# Patient Record
Sex: Female | Born: 1965 | Race: Black or African American | Hispanic: No | Smoking: Current every day smoker
Health system: Southern US, Community
[De-identification: ages and names within clinical notes are randomized; demographics above are authoritative.]

## PROBLEM LIST (undated history)

## (undated) DIAGNOSIS — R569 Unspecified convulsions: Secondary | ICD-10-CM

## (undated) DIAGNOSIS — E1121 Type 2 diabetes mellitus with diabetic nephropathy: Secondary | ICD-10-CM

## (undated) DIAGNOSIS — C50919 Malignant neoplasm of unspecified site of unspecified female breast: Secondary | ICD-10-CM

## (undated) DIAGNOSIS — E039 Hypothyroidism, unspecified: Secondary | ICD-10-CM

## (undated) DIAGNOSIS — E038 Other specified hypothyroidism: Secondary | ICD-10-CM

## (undated) DIAGNOSIS — N186 End stage renal disease: Secondary | ICD-10-CM

## (undated) DIAGNOSIS — Z720 Tobacco use: Secondary | ICD-10-CM

## (undated) DIAGNOSIS — E119 Type 2 diabetes mellitus without complications: Secondary | ICD-10-CM

## (undated) DIAGNOSIS — K529 Noninfective gastroenteritis and colitis, unspecified: Secondary | ICD-10-CM

## (undated) DIAGNOSIS — E559 Vitamin D deficiency, unspecified: Secondary | ICD-10-CM

## (undated) DIAGNOSIS — N049 Nephrotic syndrome with unspecified morphologic changes: Secondary | ICD-10-CM

## (undated) DIAGNOSIS — I10 Essential (primary) hypertension: Secondary | ICD-10-CM

## (undated) DIAGNOSIS — D649 Anemia, unspecified: Secondary | ICD-10-CM

## (undated) DIAGNOSIS — Z992 Dependence on renal dialysis: Secondary | ICD-10-CM

## (undated) DIAGNOSIS — E785 Hyperlipidemia, unspecified: Secondary | ICD-10-CM

## (undated) HISTORY — PX: PARTIAL HYSTERECTOMY: SHX80

## (undated) HISTORY — PX: BREAST SURGERY: SHX581

---

## 2015-12-20 ENCOUNTER — Emergency Department
Admission: EM | Admit: 2015-12-20 | Discharge: 2015-12-20 | Disposition: A | Discharge status: Home / Self Care | Attending: Emergency Medicine | Admitting: Emergency Medicine

## 2015-12-20 DIAGNOSIS — R220 Localized swelling, mass and lump, head: Secondary | ICD-10-CM | POA: Diagnosis not present

## 2015-12-20 DIAGNOSIS — R197 Diarrhea, unspecified: Secondary | ICD-10-CM | POA: Diagnosis not present

## 2015-12-20 DIAGNOSIS — Z880 Allergy status to penicillin: Secondary | ICD-10-CM | POA: Insufficient documentation

## 2015-12-20 DIAGNOSIS — F17210 Nicotine dependence, cigarettes, uncomplicated: Secondary | ICD-10-CM | POA: Diagnosis not present

## 2015-12-20 DIAGNOSIS — R2233 Localized swelling, mass and lump, upper limb, bilateral: Secondary | ICD-10-CM | POA: Diagnosis not present

## 2015-12-20 DIAGNOSIS — R2243 Localized swelling, mass and lump, lower limb, bilateral: Secondary | ICD-10-CM | POA: Diagnosis not present

## 2015-12-20 DIAGNOSIS — E11649 Type 2 diabetes mellitus with hypoglycemia without coma: Secondary | ICD-10-CM | POA: Insufficient documentation

## 2015-12-20 DIAGNOSIS — I10 Essential (primary) hypertension: Secondary | ICD-10-CM | POA: Diagnosis not present

## 2015-12-20 DIAGNOSIS — E162 Hypoglycemia, unspecified: Secondary | ICD-10-CM

## 2015-12-20 HISTORY — DX: Essential (primary) hypertension: I10

## 2015-12-20 LAB — BASIC METABOLIC PANEL
Anion gap: 5 (ref 5–15)
BUN: 17 mg/dL (ref 6–20)
CALCIUM: 7.9 mg/dL — AB (ref 8.9–10.3)
CO2: 30 mmol/L (ref 22–32)
CREATININE: 1.13 mg/dL — AB (ref 0.44–1.00)
Chloride: 103 mmol/L (ref 101–111)
GFR calc non Af Amer: 57 mL/min — ABNORMAL LOW (ref >60)
Glucose, Bld: 180 mg/dL — ABNORMAL HIGH (ref 65–99)
Potassium: 3.1 mmol/L — ABNORMAL LOW (ref 3.5–5.1)
SODIUM: 138 mmol/L (ref 135–145)

## 2015-12-20 LAB — HEPATIC FUNCTION PANEL
ALT: 44 U/L (ref 14–54)
AST: 52 U/L — AB (ref 15–41)
Albumin: 1.7 g/dL — ABNORMAL LOW (ref 3.5–5.0)
Alkaline Phosphatase: 105 U/L (ref 38–126)
Bilirubin, Direct: <0.1 mg/dL — ABNORMAL LOW (ref 0.1–0.5)
TOTAL PROTEIN: 5.2 g/dL — AB (ref 6.5–8.1)
Total Bilirubin: 0.6 mg/dL (ref 0.3–1.2)

## 2015-12-20 LAB — CBC
HCT: 32.5 % — ABNORMAL LOW (ref 35.0–47.0)
Hemoglobin: 10.6 g/dL — ABNORMAL LOW (ref 12.0–16.0)
MCH: 28.3 pg (ref 26.0–34.0)
MCHC: 32.7 g/dL (ref 32.0–36.0)
MCV: 86.7 fL (ref 80.0–100.0)
PLATELETS: 249 10*3/uL (ref 150–440)
RBC: 3.75 MIL/uL — AB (ref 3.80–5.20)
RDW: 15.3 % — AB (ref 11.5–14.5)
WBC: 10.2 10*3/uL (ref 3.6–11.0)

## 2015-12-20 LAB — GLUCOSE, CAPILLARY
GLUCOSE-CAPILLARY: 153 mg/dL — AB (ref 65–99)
GLUCOSE-CAPILLARY: 217 mg/dL — AB (ref 65–99)

## 2015-12-20 LAB — TROPONIN I: Troponin I: 0.03 ng/mL (ref <0.031)

## 2015-12-20 MED ORDER — PROTEIN POWD
25.0000 g | Freq: Two times a day (BID) | Status: DC
Start: 2015-12-20 — End: 2018-08-11

## 2015-12-20 NOTE — Discharge Instructions (Signed)
Please eat small meals throughout the day to maintain your blood glucose. Please follow up with her primary care doctor in 1-2 days for recheck/reevaluation. The labs of ulcer shown a low protein level. Please supplement with protein powder twice daily as written. Please probably primary care physician in one to 2 days for reevaluation.   Hypoglycemia Hypoglycemia occurs when the glucose in your blood is too low. Glucose is a type of sugar that is your body's main energy source. Hormones, such as insulin and glucagon, control the level of glucose in the blood. Insulin lowers blood glucose and glucagon increases blood glucose. Having too much insulin in your blood stream, or not eating enough food containing sugar, can result in hypoglycemia. Hypoglycemia can happen to people with or without diabetes. It can develop quickly and can be a medical emergency.  CAUSES   Missing or delaying meals.  Not eating enough carbohydrates at meals.  Taking too much diabetes medicine.  Not timing your oral diabetes medicine or insulin doses with meals, snacks, and exercise.  Nausea and vomiting.  Certain medicines.  Severe illnesses, such as hepatitis, kidney disorders, and certain eating disorders.  Increased activity or exercise without eating something extra or adjusting medicines.  Drinking too much alcohol.  A nerve disorder that affects body functions like your heart rate, blood pressure, and digestion (autonomic neuropathy).  A condition where the stomach muscles do not function properly (gastroparesis). Therefore, medicines and food may not absorb properly.  Rarely, a tumor of the pancreas can produce too much insulin. SYMPTOMS   Hunger.  Sweating (diaphoresis).  Change in body temperature.  Shakiness.  Headache.  Anxiety.  Lightheadedness.  Irritability.  Difficulty concentrating.  Dry mouth.  Tingling or numbness in the hands or feet.  Restless sleep or sleep  disturbances.  Altered speech and coordination.  Change in mental status.  Seizures or prolonged convulsions.  Combativeness.  Drowsiness (lethargic).  Weakness.  Increased heart rate or palpitations.  Confusion.  Pale, gray skin color.  Blurred or double vision.  Fainting. DIAGNOSIS  A physical exam and medical history will be performed. Your caregiver may make a diagnosis based on your symptoms. Blood tests and other lab tests may be performed to confirm a diagnosis. Once the diagnosis is made, your caregiver will see if your signs and symptoms go away once your blood glucose is raised.  TREATMENT  Usually, you can easily treat your hypoglycemia when you notice symptoms.  Check your blood glucose. If it is less than 70 mg/dl, take one of the following:   3-4 glucose tablets.    cup juice.    cup regular soda.   1 cup skim milk.   -1 tube of glucose gel.   5-6 hard candies.   Avoid high-fat drinks or food that may delay a rise in blood glucose levels.  Do not take more than the recommended amount of sugary foods, drinks, gel, or tablets. Doing so will cause your blood glucose to go too high.   Wait 10-15 minutes and recheck your blood glucose. If it is still less than 70 mg/dl or below your target range, repeat treatment.   Eat a snack if it is more than 1 hour until your next meal.  There may be a time when your blood glucose may go so low that you are unable to treat yourself at home when you start to notice symptoms. You may need someone to help you. You may even faint or be unable to  swallow. If you cannot treat yourself, someone will need to bring you to the hospital.  [place]  If you have diabetes, follow your diabetes management plan by:  Taking your medicines as directed.  Following your exercise plan.  Following your meal plan. Do not skip meals. Eat on time.  Testing your blood glucose regularly. Check your blood  glucose before and after exercise. If you exercise longer or different than usual, be sure to check blood glucose more frequently.  Wearing your medical alert jewelry that says you have diabetes.  Identify the cause of your hypoglycemia. Then, develop ways to prevent the recurrence of hypoglycemia.  Do not take a hot bath or shower right after an insulin shot.  Always carry treatment with you. Glucose tablets are the easiest to carry.  If you are going to drink alcohol, drink it only with meals.  Tell friends or family members ways to keep you safe during a seizure. This may include removing hard or sharp objects from the area or turning you on your side.  Maintain a healthy weight. SEEK MEDICAL CARE IF:   You are having problems keeping your blood glucose in your target range.  You are having frequent episodes of hypoglycemia.  You feel you might be having side effects from your medicines.  You are not sure why your blood glucose is dropping so low.  You notice a change in vision or a new problem with your vision. SEEK IMMEDIATE MEDICAL CARE IF:   Confusion develops.  A change in mental status occurs.  The inability to swallow develops.  Fainting occurs.   This information is not intended to replace advice given to you by your health care provider. Make sure you discuss any questions you have with your health care provider.   Document Released: 11/14/2005 Document Revised: 11/19/2013 Document Reviewed: 07/21/2015 Elsevier Interactive Patient Education [employer].

## 2015-12-20 NOTE — ED Notes (Signed)
Pt came in via EMS from home c/o weakness due to low blood sugar. Per EMS, pt was going in and out of alertness. Pt alert and oriented upon arrival. Pt reports blood sugar 43 this morning. Blood sugar 71 with EMS. EMS gave pt juice and peanut butter.CBG 153 upon arrival. Pt is edematous all over including eye lids, feet, and abdomen. Pt reports primary care doctor told her it was due to her chronic diarrhea and losing protein.

## 2015-12-20 NOTE — ED Provider Notes (Signed)
[facility] Emergency Department Provider Note  Time seen: 3:38 PM  I have reviewed the triage vital signs and the nursing notes.   HISTORY  Chief Complaint Hypoglycemia    HPI Alyssa Floyd is a 50 y.o. female with a past medical history of diabetes, hypertension, who presents to the emergency department with a low blood sugar. According to the daughter the patient was supposed. Church this morning, when the patient did not show to church the daughter became worried. Attempted to call her and texture without response. She called EMS who went to the house and found the patient very somnolent, they were able to arouse her and have her drink orange juice and peanut butter. Patient improved dramatically, upon arrival to the emergency department is at baseline with a blood glucose 153. Per EMS upon their arrival her blood glucose was 43. The patient states she takes Lantus 45 units at night as her only diabetic medication. She states she is very full from a big lunch yesterday and did not eat dinner which is likely the cause of her low blood glucose is morning. As a secondary complaint the patient also notes swelling in her face, arms and legs which has been ongoing for 2-3 months at this time. She states her doctor told her it was due to low protein as the patient has chronic diarrhea. Denies any worsening of this but is asking if we can check her protein.    Past Medical History  Diagnosis Date  . Diabetes mellitus without complication ([place])   . Hypertension     There are no active problems to display for this patient.   Past Surgical History  Procedure Laterality Date  . Partial hysterectomy      No current outpatient prescriptions on file.  Allergies Penicillins  No family history on file.  Social History Social History  Substance Use Topics  . Smoking status: Current Every Day Smoker -- 0.50 packs/day    Types: Cigarettes  . Smokeless tobacco:  Never Used  . Alcohol Use: No    Review of Systems Constitutional: Negative for fever. Cardiovascular: Negative for chest pain. Respiratory: Negative for shortness of breath. Gastrointestinal: Negative for abdominal pain Genitourinary: Negative for dysuria Neurological: Negative for headache 10-point ROS otherwise negative.  ____________________________________________   PHYSICAL EXAM:  VITAL SIGNS: ED Triage Vitals  Enc Vitals Group     BP 12/20/15 1455 169/92 mmHg     Pulse Rate 12/20/15 1455 66     Resp 12/20/15 1455 18     Temp --      Temp src --      SpO2 12/20/15 1455 98 %     Weight 12/20/15 1455 152 lb (68.947 kg)     Height 12/20/15 1455 5\' 5"  (1.651 m)     Head Cir --      Peak Flow --      Pain Score --      Pain Loc --      Pain Edu? --      Excl. in [place]? --     Constitutional: Alert and oriented. Well appearing and in no distress. Eyes: Patient does have mild edema of her eyelids and face. ENT   Head:  atraumatic.   Mouth/Throat: Mucous membranes are moist. Cardiovascular: Normal rate, regular rhythm. No murmur Respiratory: Normal respiratory effort without tachypnea nor retractions. Breath sounds are clear and equal bilaterally. No wheezes/rales/rhonchi. Gastrointestinal: Soft and nontender. No distention.   Musculoskeletal: Nontender with  normal range of motion in all extremities. Mild peripheral edema bilateral nontender. Neurologic:  Normal speech and language. No gross focal neurologic deficits Skin:  Skin is warm, dry and intact.  Psychiatric: Mood and affect are normal. Speech and behavior are normal.   ____________________________________________    EKG  EKG reviewed and interpreted by myself shows normal sinus rhythm at 60 bpm, narrow QRS, normal axis, normal intervals, nonspecific but no concerning ST changes.  ____________________________________________     INITIAL IMPRESSION / ASSESSMENT AND PLAN / ED COURSE  Pertinent  labs & imaging results that were available during my care of the patient were reviewed by me and considered in my medical decision making (see chart for details).  Patient presents to the emergency department hypoglycemia, now normal after administration of juice and peanut butter by EMS. Patient states she did not eat dinner last night which was abnormal for her, she still dosed her normal amount of Lantus. This is likely the cause for hypoglycemia. We'll check labs, close to monitor in the emergency department. We will also check LFTs and albumen level due to the patient's edema for 2-3 months.  Hypoglycemia likely due to insulin administration without eating. Labs are largely at her baseline. We'll discharge patient home with primary care follow-up. I discussed with the patient routine supplementation  ____________________________________________   FINAL CLINICAL IMPRESSION(S) / ED DIAGNOSES  Hypoglycemia   Harvest Dark, MD 12/25/15 [phone]

## 2015-12-28 DIAGNOSIS — E1021 Type 1 diabetes mellitus with diabetic nephropathy: Secondary | ICD-10-CM | POA: Insufficient documentation

## 2015-12-29 DIAGNOSIS — R809 Proteinuria, unspecified: Secondary | ICD-10-CM | POA: Insufficient documentation

## 2016-01-08 DIAGNOSIS — E440 Moderate protein-calorie malnutrition: Secondary | ICD-10-CM | POA: Insufficient documentation

## 2016-01-26 DIAGNOSIS — E559 Vitamin D deficiency, unspecified: Secondary | ICD-10-CM | POA: Insufficient documentation

## 2016-01-29 ENCOUNTER — Emergency Department (HOSPITAL_COMMUNITY)

## 2016-01-29 ENCOUNTER — Inpatient Hospital Stay (HOSPITAL_COMMUNITY)
Admission: EM | Admit: 2016-01-29 | Discharge: 2016-02-01 | DRG: 637 | Disposition: A | Discharge status: Home Health | Source: Intra-hospital | Attending: Infectious Disease | Admitting: Infectious Disease

## 2016-01-29 ENCOUNTER — Encounter (HOSPITAL_COMMUNITY): Payer: Self-pay

## 2016-01-29 DIAGNOSIS — E1069 Type 1 diabetes mellitus with other specified complication: Principal | ICD-10-CM | POA: Diagnosis present

## 2016-01-29 DIAGNOSIS — G9341 Metabolic encephalopathy: Secondary | ICD-10-CM | POA: Diagnosis present

## 2016-01-29 DIAGNOSIS — Z87441 Personal history of nephrotic syndrome: Secondary | ICD-10-CM | POA: Diagnosis not present

## 2016-01-29 DIAGNOSIS — G40909 Epilepsy, unspecified, not intractable, without status epilepticus: Secondary | ICD-10-CM | POA: Insufficient documentation

## 2016-01-29 DIAGNOSIS — Z90711 Acquired absence of uterus with remaining cervical stump: Secondary | ICD-10-CM

## 2016-01-29 DIAGNOSIS — E860 Dehydration: Secondary | ICD-10-CM | POA: Diagnosis not present

## 2016-01-29 DIAGNOSIS — Z720 Tobacco use: Secondary | ICD-10-CM | POA: Diagnosis present

## 2016-01-29 DIAGNOSIS — B370 Candidal stomatitis: Secondary | ICD-10-CM | POA: Diagnosis present

## 2016-01-29 DIAGNOSIS — G9340 Encephalopathy, unspecified: Secondary | ICD-10-CM | POA: Diagnosis not present

## 2016-01-29 DIAGNOSIS — Z833 Family history of diabetes mellitus: Secondary | ICD-10-CM | POA: Diagnosis not present

## 2016-01-29 DIAGNOSIS — I10 Essential (primary) hypertension: Secondary | ICD-10-CM | POA: Diagnosis not present

## 2016-01-29 DIAGNOSIS — I4581 Long QT syndrome: Secondary | ICD-10-CM | POA: Diagnosis not present

## 2016-01-29 DIAGNOSIS — R29810 Facial weakness: Secondary | ICD-10-CM

## 2016-01-29 DIAGNOSIS — H5509 Other forms of nystagmus: Secondary | ICD-10-CM | POA: Diagnosis not present

## 2016-01-29 DIAGNOSIS — E1101 Type 2 diabetes mellitus with hyperosmolarity with coma: Secondary | ICD-10-CM | POA: Insufficient documentation

## 2016-01-29 DIAGNOSIS — G510 Bell's palsy: Secondary | ICD-10-CM

## 2016-01-29 DIAGNOSIS — K529 Noninfective gastroenteritis and colitis, unspecified: Secondary | ICD-10-CM | POA: Diagnosis present

## 2016-01-29 DIAGNOSIS — E039 Hypothyroidism, unspecified: Secondary | ICD-10-CM | POA: Diagnosis present

## 2016-01-29 DIAGNOSIS — E1010 Type 1 diabetes mellitus with ketoacidosis without coma: Secondary | ICD-10-CM | POA: Diagnosis present

## 2016-01-29 DIAGNOSIS — E1065 Type 1 diabetes mellitus with hyperglycemia: Secondary | ICD-10-CM | POA: Diagnosis not present

## 2016-01-29 DIAGNOSIS — E1021 Type 1 diabetes mellitus with diabetic nephropathy: Secondary | ICD-10-CM | POA: Diagnosis present

## 2016-01-29 DIAGNOSIS — E063 Autoimmune thyroiditis: Secondary | ICD-10-CM | POA: Diagnosis not present

## 2016-01-29 DIAGNOSIS — E785 Hyperlipidemia, unspecified: Secondary | ICD-10-CM | POA: Diagnosis present

## 2016-01-29 DIAGNOSIS — D6489 Other specified anemias: Secondary | ICD-10-CM | POA: Diagnosis present

## 2016-01-29 DIAGNOSIS — Z794 Long term (current) use of insulin: Secondary | ICD-10-CM | POA: Diagnosis not present

## 2016-01-29 DIAGNOSIS — E876 Hypokalemia: Secondary | ICD-10-CM | POA: Diagnosis not present

## 2016-01-29 DIAGNOSIS — R9431 Abnormal electrocardiogram [ECG] [EKG]: Secondary | ICD-10-CM | POA: Diagnosis present

## 2016-01-29 DIAGNOSIS — N179 Acute kidney failure, unspecified: Secondary | ICD-10-CM | POA: Diagnosis present

## 2016-01-29 DIAGNOSIS — N049 Nephrotic syndrome with unspecified morphologic changes: Secondary | ICD-10-CM

## 2016-01-29 DIAGNOSIS — I6783 Posterior reversible encephalopathy syndrome: Secondary | ICD-10-CM

## 2016-01-29 DIAGNOSIS — R739 Hyperglycemia, unspecified: Secondary | ICD-10-CM

## 2016-01-29 DIAGNOSIS — E870 Hyperosmolality and hypernatremia: Secondary | ICD-10-CM | POA: Diagnosis not present

## 2016-01-29 DIAGNOSIS — D649 Anemia, unspecified: Secondary | ICD-10-CM | POA: Diagnosis present

## 2016-01-29 DIAGNOSIS — Z880 Allergy status to penicillin: Secondary | ICD-10-CM

## 2016-01-29 DIAGNOSIS — E038 Other specified hypothyroidism: Secondary | ICD-10-CM | POA: Diagnosis present

## 2016-01-29 DIAGNOSIS — R471 Dysarthria and anarthria: Secondary | ICD-10-CM | POA: Insufficient documentation

## 2016-01-29 DIAGNOSIS — F17210 Nicotine dependence, cigarettes, uncomplicated: Secondary | ICD-10-CM | POA: Diagnosis not present

## 2016-01-29 HISTORY — DX: Hyperlipidemia, unspecified: E78.5

## 2016-01-29 HISTORY — DX: Noninfective gastroenteritis and colitis, unspecified: K52.9

## 2016-01-29 HISTORY — DX: Type 2 diabetes mellitus with diabetic nephropathy: E11.21

## 2016-01-29 HISTORY — DX: Nephrotic syndrome with unspecified morphologic changes: N04.9

## 2016-01-29 HISTORY — DX: Hypothyroidism, unspecified: E03.9

## 2016-01-29 HISTORY — DX: Tobacco use: Z72.0

## 2016-01-29 HISTORY — DX: Vitamin D deficiency, unspecified: E55.9

## 2016-01-29 HISTORY — DX: Other specified hypothyroidism: E03.8

## 2016-01-29 HISTORY — DX: Anemia, unspecified: D64.9

## 2016-01-29 HISTORY — DX: Type 2 diabetes mellitus without complications: E11.9

## 2016-01-29 LAB — MAGNESIUM: MAGNESIUM: 2.4 mg/dL (ref 1.7–2.4)

## 2016-01-29 LAB — BASIC METABOLIC PANEL
ANION GAP: 17 — AB (ref 5–15)
ANION GAP: 16 — AB (ref 5–15)
Anion gap: 16 — ABNORMAL HIGH (ref 5–15)
BUN: 73 mg/dL — AB (ref 6–20)
BUN: 68 mg/dL — AB (ref 6–20)
BUN: 69 mg/dL — AB (ref 6–20)
CALCIUM: 8.1 mg/dL — AB (ref 8.9–10.3)
CALCIUM: 8.1 mg/dL — AB (ref 8.9–10.3)
CO2: 24 mmol/L (ref 22–32)
CO2: 24 mmol/L (ref 22–32)
CO2: 23 mmol/L (ref 22–32)
Calcium: 7.9 mg/dL — ABNORMAL LOW (ref 8.9–10.3)
Chloride: 89 mmol/L — ABNORMAL LOW (ref 101–111)
Chloride: 93 mmol/L — ABNORMAL LOW (ref 101–111)
Chloride: 96 mmol/L — ABNORMAL LOW (ref 101–111)
Creatinine, Ser: 2.67 mg/dL — ABNORMAL HIGH (ref 0.44–1.00)
Creatinine, Ser: 2.53 mg/dL — ABNORMAL HIGH (ref 0.44–1.00)
Creatinine, Ser: 2.33 mg/dL — ABNORMAL HIGH (ref 0.44–1.00)
GFR calc Af Amer: 23 mL/min — ABNORMAL LOW (ref >60)
GFR calc Af Amer: 25 mL/min — ABNORMAL LOW (ref >60)
GFR calc Af Amer: 27 mL/min — ABNORMAL LOW (ref >60)
GFR calc non Af Amer: 23 mL/min — ABNORMAL LOW (ref >60)
GFR, EST NON AFRICAN AMERICAN: 20 mL/min — AB (ref >60)
GFR, EST NON AFRICAN AMERICAN: 21 mL/min — AB (ref >60)
GLUCOSE: 972 mg/dL — AB (ref 65–99)
GLUCOSE: 740 mg/dL — AB (ref 65–99)
GLUCOSE: 293 mg/dL — AB (ref 65–99)
Potassium: 3.6 mmol/L (ref 3.5–5.1)
Potassium: 3.3 mmol/L — ABNORMAL LOW (ref 3.5–5.1)
Potassium: 3.7 mmol/L (ref 3.5–5.1)
SODIUM: 130 mmol/L — AB (ref 135–145)
SODIUM: 133 mmol/L — AB (ref 135–145)
Sodium: 135 mmol/L (ref 135–145)

## 2016-01-29 LAB — PHOSPHORUS: PHOSPHORUS: 5.7 mg/dL — AB (ref 2.5–4.6)

## 2016-01-29 LAB — I-STAT CHEM 8, ED
BUN: 71 mg/dL — AB (ref 6–20)
CALCIUM ION: 0.94 mmol/L — AB (ref 1.12–1.23)
Chloride: 83 mmol/L — ABNORMAL LOW (ref 101–111)
Creatinine, Ser: 2.4 mg/dL — ABNORMAL HIGH (ref 0.44–1.00)
HEMATOCRIT: 33 % — AB (ref 36.0–46.0)
Hemoglobin: 11.2 g/dL — ABNORMAL LOW (ref 12.0–15.0)
Potassium: 4.7 mmol/L (ref 3.5–5.1)
SODIUM: 120 mmol/L — AB (ref 135–145)
TCO2: 21 mmol/L (ref 0–100)

## 2016-01-29 LAB — COMPREHENSIVE METABOLIC PANEL
ALK PHOS: 122 U/L (ref 38–126)
ALT: 22 U/L (ref 14–54)
ANION GAP: 25 — AB (ref 5–15)
AST: 19 U/L (ref 15–41)
Albumin: 2 g/dL — ABNORMAL LOW (ref 3.5–5.0)
BILIRUBIN TOTAL: 1 mg/dL (ref 0.3–1.2)
BUN: 80 mg/dL — ABNORMAL HIGH (ref 6–20)
CALCIUM: 8.1 mg/dL — AB (ref 8.9–10.3)
CO2: 16 mmol/L — ABNORMAL LOW (ref 22–32)
Chloride: 79 mmol/L — ABNORMAL LOW (ref 101–111)
Creatinine, Ser: 3.04 mg/dL — ABNORMAL HIGH (ref 0.44–1.00)
GFR, EST AFRICAN AMERICAN: 20 mL/min — AB (ref >60)
GFR, EST NON AFRICAN AMERICAN: 17 mL/min — AB (ref >60)
GLUCOSE: 1361 mg/dL — AB (ref 65–99)
POTASSIUM: 4.8 mmol/L (ref 3.5–5.1)
Sodium: 120 mmol/L — ABNORMAL LOW (ref 135–145)
TOTAL PROTEIN: 5.2 g/dL — AB (ref 6.5–8.1)

## 2016-01-29 LAB — DIFFERENTIAL
Basophils Absolute: 0 10*3/uL (ref 0.0–0.1)
Basophils Relative: 0 %
EOS ABS: 0 10*3/uL (ref 0.0–0.7)
EOS PCT: 0 %
LYMPHS ABS: 0.9 10*3/uL (ref 0.7–4.0)
LYMPHS PCT: 9 %
MONO ABS: 0.5 10*3/uL (ref 0.1–1.0)
MONOS PCT: 5 %
NEUTROS PCT: 86 %
Neutro Abs: 9.1 10*3/uL — ABNORMAL HIGH (ref 1.7–7.7)

## 2016-01-29 LAB — I-STAT TROPONIN, ED: TROPONIN I, POC: 0.01 ng/mL (ref 0.00–0.08)

## 2016-01-29 LAB — RAPID URINE DRUG SCREEN, HOSP PERFORMED
Amphetamines: NOT DETECTED
Barbiturates: NOT DETECTED
Benzodiazepines: NOT DETECTED
COCAINE: NOT DETECTED
OPIATES: NOT DETECTED
TETRAHYDROCANNABINOL: NOT DETECTED

## 2016-01-29 LAB — PROTIME-INR
INR: 0.92 (ref 0.00–1.49)
PROTHROMBIN TIME: 12.6 s (ref 11.6–15.2)

## 2016-01-29 LAB — URINALYSIS, ROUTINE W REFLEX MICROSCOPIC
Glucose, UA: >1000 mg/dL — AB
KETONES UR: NEGATIVE mg/dL
LEUKOCYTES UA: NEGATIVE
NITRITE: NEGATIVE
PH: 5 (ref 5.0–8.0)
Protein, ur: 100 mg/dL — AB
Specific Gravity, Urine: 1.021 (ref 1.005–1.030)

## 2016-01-29 LAB — CBG MONITORING, ED
GLUCOSE-CAPILLARY: 581 mg/dL — AB (ref 65–99)
GLUCOSE-CAPILLARY: 526 mg/dL — AB (ref 65–99)
GLUCOSE-CAPILLARY: 358 mg/dL — AB (ref 65–99)
Glucose-Capillary: >600 mg/dL (ref 65–99)
Glucose-Capillary: >600 mg/dL (ref 65–99)
Glucose-Capillary: 322 mg/dL — ABNORMAL HIGH (ref 65–99)
Glucose-Capillary: 234 mg/dL — ABNORMAL HIGH (ref 65–99)
Glucose-Capillary: 152 mg/dL — ABNORMAL HIGH (ref 65–99)

## 2016-01-29 LAB — CBC
HCT: 31.8 % — ABNORMAL LOW (ref 36.0–46.0)
Hemoglobin: 10.2 g/dL — ABNORMAL LOW (ref 12.0–15.0)
MCH: 27.9 pg (ref 26.0–34.0)
MCHC: 32.1 g/dL (ref 30.0–36.0)
MCV: 86.9 fL (ref 78.0–100.0)
Platelets: 300 10*3/uL (ref 150–400)
RBC: 3.66 MIL/uL — AB (ref 3.87–5.11)
RDW: 14.7 % (ref 11.5–15.5)
WBC: 10.5 10*3/uL (ref 4.0–10.5)

## 2016-01-29 LAB — OSMOLALITY: OSMOLALITY: 324 mosm/kg — AB (ref 275–295)

## 2016-01-29 LAB — BETA-HYDROXYBUTYRIC ACID: BETA-HYDROXYBUTYRIC ACID: 0.14 mmol/L (ref 0.05–0.27)

## 2016-01-29 LAB — URINE MICROSCOPIC-ADD ON: WBC, UA: NONE SEEN WBC/hpf (ref 0–5)

## 2016-01-29 LAB — APTT: APTT: 21 s — AB (ref 24–37)

## 2016-01-29 LAB — ETHANOL: Alcohol, Ethyl (B): <5 mg/dL (ref <5)

## 2016-01-29 MED ORDER — SODIUM CHLORIDE 0.9% FLUSH
3.0000 mL | Freq: Two times a day (BID) | INTRAVENOUS | Status: DC
  Administered 2016-01-29 – 2016-01-31 (×5): 3 mL via INTRAVENOUS

## 2016-01-29 MED ORDER — ACETAMINOPHEN 650 MG RE SUPP: 650.0000 mg | Freq: Four times a day (QID) | RECTAL | Status: DC | PRN

## 2016-01-29 MED ORDER — DEXTROSE-NACL 5-0.45 % IV SOLN: INTRAVENOUS | Status: DC

## 2016-01-29 MED ORDER — SODIUM CHLORIDE 0.9 % IV SOLN: INTRAVENOUS | Status: AC

## 2016-01-29 MED ORDER — SODIUM CHLORIDE 0.9 % IV SOLN
INTRAVENOUS | Status: AC
  Administered 2016-01-29: 1000 mL via INTRAVENOUS

## 2016-01-29 MED ORDER — ENOXAPARIN SODIUM 30 MG/0.3ML ~~LOC~~ SOLN
30.0000 mg | SUBCUTANEOUS | Status: DC
  Administered 2016-01-29 – 2016-01-30 (×2): 30 mg via SUBCUTANEOUS
  Filled 2016-01-29: qty 0.3

## 2016-01-29 MED ORDER — NYSTATIN 100000 UNIT/ML MT SUSP
5.0000 mL | Freq: Four times a day (QID) | OROMUCOSAL | Status: DC
Start: 2016-01-29 — End: 2016-02-01
  Administered 2016-01-30 – 2016-02-01 (×8): 500000 [IU] via ORAL
  Filled 2016-01-29 (×13): qty 5

## 2016-01-29 MED ORDER — SODIUM CHLORIDE 0.9 % IV BOLUS (SEPSIS)
2000.0000 mL | Freq: Once | INTRAVENOUS | Status: AC
Start: 2016-01-29 — End: 2016-01-29
  Administered 2016-01-29: 2000 mL via INTRAVENOUS

## 2016-01-29 MED ORDER — SODIUM CHLORIDE 0.9 % IV SOLN
INTRAVENOUS | Status: DC
  Administered 2016-01-29: 5.4 [IU]/h via INTRAVENOUS
  Filled 2016-01-29 (×2): qty 2.5

## 2016-01-29 MED ORDER — INSULIN ASPART 100 UNIT/ML ~~LOC~~ SOLN
14.0000 [IU] | Freq: Once | SUBCUTANEOUS | Status: AC
  Administered 2016-01-29: 14 [IU] via INTRAVENOUS
  Filled 2016-01-29: qty 1

## 2016-01-29 MED ORDER — ACETAMINOPHEN 325 MG PO TABS: 650.0000 mg | ORAL_TABLET | Freq: Four times a day (QID) | ORAL | Status: DC | PRN

## 2016-01-29 MED ORDER — PROMETHAZINE HCL 25 MG/ML IJ SOLN: 12.5000 mg | Freq: Four times a day (QID) | INTRAMUSCULAR | Status: DC | PRN

## 2016-01-29 MED ORDER — KCL IN DEXTROSE-NACL 30-5-0.45 MEQ/L-%-% IV SOLN
INTRAVENOUS | Status: DC
  Administered 2016-01-29: 1000 mL via INTRAVENOUS
  Filled 2016-01-29 (×2): qty 1000

## 2016-01-29 NOTE — ED Notes (Signed)
Obtained pt's CBG, CBG monitor read too high to read. Nurse was notified.

## 2016-01-29 NOTE — Consult Note (Signed)
Requesting Physician: Dr. Venora Maples    Chief Complaint:  Slurred speech  HPI:                                                                                                                                         Alyssa Floyd is an 50 y.o. female with known uncontrolled DM. Patietn was last seen normal at 0330. At 0630 she was noted to be weak and have slurred speech. EMS called and code stroke activated. Currently patient is very drowsy and has slurred speech and findings consistent with Bell's palsy on the left.   Date last known well: Today Time last known well: Time: 03:30 tPA Given: No: out of window     Past Medical History  Diagnosis Date  . Diabetes mellitus without complication ([place])   . Hypertension     Past Surgical History  Procedure Laterality Date  . Partial hysterectomy      No family history on file. Social History:  reports that she has been smoking Cigarettes.  She has been smoking about 0.50 packs per day. She has never used smokeless tobacco. She reports that she does not drink alcohol or use illicit drugs.  Allergies:  Allergies  Allergen Reactions  . Penicillins Other (See Comments)    Pt reports hallucinations when taking penicillins. >Has patient had a PCN reaction causing immediate rash, facial/tongue/throat swelling, SOB or lightheadedness with hypotension: No Has patient had a PCN reaction causing severe rash involving mucus membranes or skin necrosis: No Has patient had a PCN reaction that required hospitalization No Has patient had a PCN reaction occurring within the last 10 years: No If all of the above answers are "NO", then may proceed with Cephalosporin use.    Medications:                                                                                                                           No current facility-administered medications for this encounter.   Current Outpatient Prescriptions  Medication Sig Dispense Refill  . insulin  glargine (LANTUS) 100 UNIT/ML injection Inject 45 Units into the skin at bedtime.    . Protein POWD 25 g by Does not apply route 2 (two) times daily. 454 g 0     ROS:  History obtained from unobtainable from patient due to mental status   Neurologic Examination:                                                                                                      There were no vitals taken for this visit.  HEENT-  Normocephalic, no lesions, without obvious abnormality.  Normal external eye and conjunctiva.  Normal TM's bilaterally.  Normal auditory canals and external ears. Normal external nose, mucus membranes and septum.  Normal pharynx. Cardiovascular- S1, S2 normal, pulses palpable throughout   Lungs- chest clear, no wheezing, rales, normal symmetric air entry Abdomen- normal findings: bowel sounds normal Extremities- no edema Lymph-no adenopathy palpable Musculoskeletal-no joint tenderness, deformity or swelling Skin-warm and dry, no hyperpigmentation, vitiligo, or suspicious lesions  Neurological Examination Mental Status: Alert, not oriented.  Speech dysarthic without evidence of aphasia.  Able to follow 3 step commands without difficulty. Cranial Nerves: II: Visual acuity is difficult to assess normal, pupils equal, round, reactive to light and accommodation III,IV, VI: ptosis not present, extra-ocular motions intact bilaterally--when blinking left eye lid lags behind V,VII: smile symmetric with left NL fold decfreased, facial light touch sensation normal bilaterally--focal tremor on the chin VIII: hearing normal bilaterally IX,X: uvula rises symmetrically XI: bilateral shoulder shrug XII: midline tongue extension Motor: Right : Upper extremity   5/5    Left:     Upper extremity   5/5  Lower extremity   5/5     Lower extremity   5/5 Tone and  bulk:normal tone throughout; no atrophy noted Sensory: Pinprick and light touch intact throughout, bilaterally Deep Tendon Reflexes: 2+ and symmetric throughout Plantars: Right: downgoing   Left: downgoing Cerebellar: normal finger-to-nose and normal heel-to-shin test Gait: not tested       Lab Results: Basic Metabolic Panel:  Recent Labs Lab 01/29/16 0904  NA 120*  K 4.7  CL 83*  GLUCOSE >700*  BUN 71*  CREATININE 2.40*    Liver Function Tests: No results for input(s): AST, ALT, ALKPHOS, BILITOT, PROT, ALBUMIN in the last 168 hours. No results for input(s): LIPASE, AMYLASE in the last 168 hours. No results for input(s): AMMONIA in the last 168 hours.  CBC:  Recent Labs Lab 01/29/16 0904  WBC 10.5  NEUTROABS 9.1*  HGB 11.2*  10.2*  HCT 33.0*  31.8*  MCV 86.9  PLT 300    Cardiac Enzymes: No results for input(s): CKTOTAL, CKMB, CKMBINDEX, TROPONINI in the last 168 hours.  Lipid Panel: No results for input(s): CHOL, TRIG, HDL, CHOLHDL, VLDL, LDLCALC in the last 168 hours.  CBG:  Recent Labs Lab 01/29/16 0914  GLUCAP >600*    Microbiology: No results found for this or any previous visit.  Coagulation Studies: No results for input(s): LABPROT, INR in the last 72 hours.  Imaging: Ct Head Wo Contrast  01/29/2016  CLINICAL DATA:  Acute onset slurred speech and left-sided facial droop EXAM: CT HEAD WITHOUT CONTRAST TECHNIQUE: Contiguous axial images were obtained from the base of the skull through the vertex without intravenous contrast. COMPARISON:  None.  FINDINGS: The ventricles are normal in size and configuration. There is no evidence of mass, hemorrhage, extra-axial fluid collection, or midline shift. Gray-white compartments appear normal. No acute infarct evident. Middle cerebral artery attenuation is symmetric bilaterally. Bony calvarium appears intact. The mastoid air cells are clear. No intraorbital lesions are apparent. IMPRESSION: Study within  normal limits. In particular, no edema, hemorrhage, or evidence of acute infarct. Electronically Signed   By: Lowella Grip III M.D.   On: 01/29/2016 09:12       Assessment and plan discussed with with attending physician and they are in agreement.    Etta Quill PA-C [facility] [phone]  01/29/2016, 9:20 AM   Assessment: 50 y.o. female presenting with Hyperglycemia and encephalopathic state. Not a tPA candidate due to out of window. Exam notable for left bell's palsy.  Concern for possible PRES versus simple focal seizure.   Stroke Risk Factors - hypertension

## 2016-01-29 NOTE — ED Notes (Addendum)
Admitting Md requested Foley cath be placed for strict I/O for determination of fluid maintence needed; Pt is currently incontinent and unable to collect urine specimen; Admitting requested urine specimen; due to patient holding in Ed foley will be placed in Ed; Md notified of low BP and critical osmolarity result

## 2016-01-29 NOTE — ED Notes (Signed)
Reported to Dr. Peggye Ley that pt. Has developed Nystagmus

## 2016-01-29 NOTE — H&P (Signed)
Date: 01/29/2016               Patient Name:  Alyssa Floyd MRN: [id number]  DOB: 07/02/1966 Age / Sex: 50 y.o., female   PCP: Alyssa Sizer, MD              Medical Service: Internal Medicine Teaching Service              Attending Physician: Dr. Sid Falcon, MD    First Contact: Claudius Sis, MS4 Pager: [phone]  Second Contact: Dr. Hulen Luster Pager: [phone]  Third Contact Dr. Tommy Medal Pager: [phone]-       After Hours (After 5p/  First Contact Pager: [phone]  weekends / holidays): Second Contact Pager: [phone]   Chief Complaint: Left facial droop, dysarthria  History of Present Illness: [name] is a 50 year old woman with PMH of DM1, HTN, HLD, and diabetic nephropathy who presents to the ED via EMS after having an episode of slurred speech and left facial droop at home. The patient normally lives at home in [place] but went to stay with her cousin for a few days starting Wednesday because she was not feeling well and seemed to have a cold or illness. Due to not feeling well she needed help taking her medications and went to her cousin's house for help. This morning ~7 AM the patient had an episode of left facial droop and slurred speech as witness by her cousin, so her cousin called EMS.  When she arrived to the ED she underwent stroke protocol. CT and MRI were negative. However blood sugar was elevated to 1300. The patient was recently discharged from [facility] on 2/10 after an episode of [name]. She takes her insulin and medications re prescribed at home but has been having trouble with hypo- and hyperglycemic episodes. Her last hga1c was 11.4% at [facility]. Per the patient's mother, the patient's cousin was taking her blood sugar in the days leading up to this hospitalization and the blood sugar was normal.  On admission the patient is oriented to person and place but not time. She is drowsy but able to follow commands and answer some yes/no questions. She endorses polyuria. She denies pain. She  continues to have facial droop and disconjugate gaze. The patient's mother reports that her prior episode of [name] was similar to this but without disconjugate gaze or facial droop.  Meds: Current Facility-Administered Medications  Medication Dose Route Frequency Provider Last Rate Last Dose  . 0.9 %  sodium chloride infusion   Intravenous Continuous Marjan Rabbani, MD 100 mL/hr at 01/29/16 1252 100 mL at 01/29/16 1252  . acetaminophen (TYLENOL) tablet 650 mg  650 mg Oral Q6H PRN Juluis Mire, MD       Or  . acetaminophen (TYLENOL) suppository 650 mg  650 mg Rectal Q6H PRN Marjan Rabbani, MD      . dextrose 5 %-0.45 % sodium chloride infusion   Intravenous Continuous Jola Schmidt, MD      . enoxaparin (LOVENOX) injection 30 mg  30 mg Subcutaneous Q24H Rachel L Rumbarger, RPH   30 mg at 01/29/16 1438  . insulin regular (NOVOLIN R,HUMULIN R) 250 Units in sodium chloride 0.9 % 250 mL (1 Units/mL) infusion   Intravenous Continuous Jola Schmidt, MD 16.2 mL/hr at 01/29/16 1332 16.2 Units/hr at 01/29/16 1332  . nystatin (MYCOSTATIN) 100000 UNIT/ML suspension 500,000 Units  5 mL Oral QID Juluis Mire, MD   500,000 Units at 01/29/16 1434  . promethazine (PHENERGAN) injection  12.5 mg  12.5 mg Intravenous Q6H PRN Marjan Rabbani, MD      . sodium chloride flush (NS) 0.9 % injection 3 mL  3 mL Intravenous Q12H Marjan Rabbani, MD   3 mL at 01/29/16 1332   Current Outpatient Prescriptions  Medication Sig Dispense Refill  . insulin glargine (LANTUS) 100 UNIT/ML injection Inject 45 Units into the skin at bedtime.    . Protein POWD 25 g by Does not apply route 2 (two) times daily. 454 g 0    Allergies: Allergies as of 01/29/2016 - Review Complete 01/29/2016  Allergen Reaction Noted  . Penicillins Other (See Comments) 12/20/2015   Past Medical History  Diagnosis Date  . Diabetes mellitus ([place])   . Hypertension   . Hyperlipidemia   . Nephrotic syndrome     diabetic nephropathy, biopsy on 01/06/16 at  [facility]  . Subclinical hypothyroidism   . Vitamin D deficiency   . 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 Tobacco use   . Normocytic anemia   . Diabetic nephropathy [facility])    Past Surgical History  Procedure Laterality Date  . Partial hysterectomy     Family History  Problem Relation Age of Onset  . Diabetes     Social History   Social History  . Marital Status: Legally Separated    Spouse Name: N/A  . Number of Children: N/A  . Years of Education: N/A   Occupational History  . Not on file.   Social History Main Topics  . Smoking status: Current Every Day Smoker -- 0.50 packs/day    Types: Cigarettes  . Smokeless tobacco: Never Used  . Alcohol Use: No  . Drug Use: No  . Sexual Activity: Not on file   Other Topics Concern  . Not on file   Social History Narrative    Review of Systems: Pertinent items are noted in HPI.  Physical Exam: Blood pressure 130/74, pulse 66, resp. rate 16, SpO2 100 %. BP 130/74 mmHg  Pulse 66  Resp 16  SpO2 100%  General Appearance:    Drowsy. Cooperative. NAD.  Head:    Normocephalic, without obvious abnormality, atraumatic  Eyes:    conjunctiva/corneas clear, EOM's intact, slow nystagmus with lateral gaze in both eyes in either direction. Disconjugate gaze  Lungs:     Clear to auscultation bilaterally, respirations unlabored  Chest Wall:    No tenderness or deformity   Heart:    Regular rate and rhythm, S1 and S2 normal, no murmur, rub   or gallop  Abdomen:     Soft. Tender to palpation over lower abdomen near bladder, pt reports she has to urinate.  no masses, no organomegaly. No guarding  Extremities:   Extremities atraumatic. Pedal edema. Non tender.   Neurologic:   Strength 2+ in extremities x4. Left facial droop. Preservation of brow movement. Oriented to self and place, but not time. Disconjugate gaze. Nystagmus.    Lab results: Basic Metabolic Panel:  Recent Labs  01/29/16 0904 01/29/16 1401  01/29/16 1402  NA 120*  120*  --  130*  K 4.8  4.7  --  3.6  CL 79*  83*  --  89*  CO2 16*  --  24  GLUCOSE 1361*  >700*  --  972*  BUN 80*  71*  --  73*  CREATININE 3.04*  2.40*  --  2.67*  CALCIUM 8.1*  --  8.1*  MG  --  2.4  --  PHOS  --  5.7*  --    Liver Function Tests:  Recent Labs  01/29/16 0904  AST 19  ALT 22  ALKPHOS 122  BILITOT 1.0  PROT 5.2*  ALBUMIN 2.0*   No results for input(s): LIPASE, AMYLASE in the last 72 hours. No results for input(s): AMMONIA in the last 72 hours. CBC:  Recent Labs  01/29/16 0904  WBC 10.5  NEUTROABS 9.1*  HGB 11.2*  10.2*  HCT 33.0*  31.8*  MCV 86.9  PLT 300   Cardiac Enzymes: No results for input(s): CKTOTAL, CKMB, CKMBINDEX, TROPONINI in the last 72 hours. BNP: No results for input(s): PROBNP in the last 72 hours. D-Dimer: No results for input(s): DDIMER in the last 72 hours. CBG:  Recent Labs  01/29/16 0914 01/29/16 1221 01/29/16 1326  GLUCAP >600* >600* >600*   Hemoglobin A1C: No results for input(s): HGBA1C in the last 72 hours. Fasting Lipid Panel: No results for input(s): CHOL, HDL, LDLCALC, TRIG, CHOLHDL, LDLDIRECT in the last 72 hours. Thyroid Function Tests: No results for input(s): TSH, T4TOTAL, FREET4, T3FREE, THYROIDAB in the last 72 hours. Anemia Panel: No results for input(s): VITAMINB12, FOLATE, FERRITIN, TIBC, IRON, RETICCTPCT in the last 72 hours. Coagulation:  Recent Labs  01/29/16 0904  LABPROT 12.6  INR 0.92   Urine Drug Screen: Drugs of Abuse  No results found for: LABOPIA, COCAINSCRNUR, LABBENZ, AMPHETMU, THCU, LABBARB  Alcohol Level:  Recent Labs  01/29/16 0904  ETH <5   Urinalysis: No results for input(s): COLORURINE, LABSPEC, PHURINE, GLUCOSEU, HGBUR, BILIRUBINUR, KETONESUR, PROTEINUR, UROBILINOGEN, NITRITE, LEUKOCYTESUR in the last 72 hours.  Invalid input(s): APPERANCEUR   Imaging results:  Ct Head Wo Contrast  01/29/2016  ADDENDUM REPORT: 01/29/2016  09:24 ADDENDUM: Critical Value/emergent results were called by telephone at the time of interpretation on 01/29/2016 at 9:20 am to Etta Quill, [place] , who verbally acknowledged these results. Electronically Signed   By: Lowella Grip III M.D.   On: 01/29/2016 09:24  01/29/2016  CLINICAL DATA:  Acute onset slurred speech and left-sided facial droop EXAM: CT HEAD WITHOUT CONTRAST TECHNIQUE: Contiguous axial images were obtained from the base of the skull through the vertex without intravenous contrast. COMPARISON:  None. FINDINGS: The ventricles are normal in size and configuration. There is no evidence of mass, hemorrhage, extra-axial fluid collection, or midline shift. Gray-white compartments appear normal. No acute infarct evident. Middle cerebral artery attenuation is symmetric bilaterally. Bony calvarium appears intact. The mastoid air cells are clear. No intraorbital lesions are apparent. IMPRESSION: Study within normal limits. In particular, no edema, hemorrhage, or evidence of acute infarct. Electronically Signed: By: Lowella Grip III M.D. On: 01/29/2016 09:12   Mr Brain Ltd W/o Cm  01/29/2016  CLINICAL DATA:  New onset of weakness and slurred speech noted at 6:30 a.m. today. Hyperglycemia. Symptoms compatible with left-sided Bell's palsy. EXAM: MRI HEAD WITHOUT CONTRAST TECHNIQUE: Multiplanar, multiecho pulse sequences of the brain and surrounding structures were obtained without intravenous contrast. COMPARISON:  Negative CT of the head without contrast from the same day. FINDINGS: Diffusion-weighted images demonstrate no evidence for acute or subacute infarction. Axial T2 and gradient echo images were also performed. There is no significant white matter disease. No acute hemorrhage or mass lesion is present. The ventricles are of normal size. Insert pass fluid The internal auditory canals are within normal limits bilaterally. Flow is present in the major intracranial arteries. Bilateral lens  replacements are noted. The globes and orbits otherwise intact. Mild mucosal  thickening is present in the anterior ethmoid air cells. The remaining paranasal sinuses are clear. There is fluid in the mastoid air cells bilaterally, left greater than right. IMPRESSION: 1. No evidence for acute infarct. 2. No focal white matter disease to suggest press. 3. Left greater than right mastoid fluid. No obstructing nasopharyngeal lesion is present. Electronically Signed   By: San Morelle M.D.   On: 01/29/2016 10:33    Other results: EKG: Movement artifact. Seemingly normal with no change compared to previous.  Assessment & Plan by Problem: Principal Problem:   [name], type 1 (HCC) Active Problems:   Acute renal failure (ARF) (HCC)   Prolonged Q-T interval on ECG   Hyperlipidemia   Hypertension   Chronic diarrhea   Nephrotic syndrome   Tobacco use   Facial paralysis   Normocytic anemia   Subclinical hypothyroidism   [name] Patient presented with dysarthria and facial droop, MRI and CT negative, blood sugar 1300s. Was hospitalized for [name] less than 1 month ago at [facility]. Compliant with medications but has had difficulty with hypo and hyperglycemic episodes. Felt a 'cold' coming on a few days before presentation. [name] most likely due to infectious etiology. - NPO - IV insulin - NS 100 ml/hr - BMP q4h, keep K>4 - UA for urine ketone - diabetes coordinator  Facial droop MRI and CT negative, however patient has never had facial droop with [name] before. Also has disconjugate gaze. No seizure history. Most likely etiology is bells palsy vs [name]-induced seizure. - NPO, no po meds (aspiration risk) - Speech eval for swallow study - EEG - Neuro consult, appreciate recs - Consider prednisone after resolution of [name] if facial droop persists  AKI Baseline Cr is 1.1, Cr was 3 at admission. Most likely prerenal etiology given fluid depletion from [name]. However, patient has a history of diabetic nephropathy  and has pedal edema at presentation. Takes torsemide at home to help with leg swelling. - IVF as above - Monitor pedal edema; hold home torsemide for now given risk/benefit of prioritizing [name] treatment - Daily weights  Oral thrush - Nystatin 4xday - HIV antibody  History of hypertension - Continue home meds: lisinopril  History of hyperlipidemia - Hold home statin while in hospital  History of chronic diarrhea - Hold home pancrealipase and imodium while acutely ill  History of subclinical hypothyroidism - anti TPO antibodies  History of tobacco use  Smokes 6 cigs daily.  - Consider nicotine patch once patient stabilized   This is a Careers information officer Note.  The care of the patient was discussed with Dr. Hulen Luster and the assessment and plan was formulated with their assistance.  Please see their note for official documentation of the patient encounter.   Signed: Claudius Sis, Med Student 01/29/2016, 3:10 PM

## 2016-01-29 NOTE — Procedures (Signed)
   Current facility-administered medications:  .  0.9 %  sodium chloride infusion, , Intravenous, Continuous, Marjan Rabbani, MD, Last Rate: 100 mL/hr at 01/29/16 1252, 100 mL at 01/29/16 1252 .  acetaminophen (TYLENOL) tablet 650 mg, 650 mg, Oral, Q6H PRN **OR** acetaminophen (TYLENOL) suppository 650 mg, 650 mg, Rectal, Q6H PRN, Marjan Rabbani, MD .  dextrose 5 % and 0.45 % NaCl with KCl 30 mEq/L infusion, , Intravenous, Continuous, Sid Falcon, MD .  enoxaparin (LOVENOX) injection 30 mg, 30 mg, Subcutaneous, Q24H, Rachel L Rumbarger, RPH, 30 mg at 01/29/16 1438 .  insulin regular (NOVOLIN R,HUMULIN R) 250 Units in sodium chloride 0.9 % 250 mL (1 Units/mL) infusion, , Intravenous, Continuous, Jola Schmidt, MD, Last Rate: 31.3 mL/hr at 01/29/16 1713, 31.3 Units/hr at 01/29/16 1713 .  nystatin (MYCOSTATIN) 100000 UNIT/ML suspension 500,000 Units, 5 mL, Oral, QID, Marjan Rabbani, MD, 500,000 Units at 01/29/16 1434 .  promethazine (PHENERGAN) injection 12.5 mg, 12.5 mg, Intravenous, Q6H PRN, Marjan Rabbani, MD .  sodium chloride flush (NS) 0.9 % injection 3 mL, 3 mL, Intravenous, Q12H, Marjan Rabbani, MD, 3 mL at 01/29/16 1332  Current outpatient prescriptions:  .  ferrous sulfate 325 (65 FE) MG tablet, Take 325 mg by mouth daily with breakfast., Disp: , Rfl:  .  insulin glargine (LANTUS) 100 UNIT/ML injection, Inject 45 Units into the skin daily., Disp: , Rfl:  .  lisinopril (PRINIVIL,ZESTRIL) 40 MG tablet, Take 80 mg by mouth daily., Disp: , Rfl:  .  magnesium oxide (MAG-OX) 400 MG tablet, Take 400 mg by mouth 2 (two) times daily., Disp: , Rfl: 1 .  Multiple Vitamin (MULTIVITAMIN WITH MINERALS) TABS tablet, Take 1 tablet by mouth daily., Disp: , Rfl:  .  potassium chloride SA (K-DUR,KLOR-CON) 20 MEQ tablet, Take 20 mEq by mouth daily., Disp: , Rfl: 0 .  torsemide (DEMADEX) 100 MG tablet, Take 50 mg by mouth 2 (two) times daily., Disp: , Rfl: 0 .  ZENPEP 5000 units CPEP, Take 10,000 Units by  mouth 3 (three) times daily with meals., Disp: , Rfl: 0 .  atorvastatin (LIPITOR) 40 MG tablet, Take 20 mg by mouth daily., Disp: , Rfl: 1 .  Protein POWD, 25 g by Does not apply route 2 (two) times daily. (Patient not taking: Reported on 01/29/2016), Disp: 454 g, Rfl: 0 .  Vitamin D, Ergocalciferol, (DRISDOL) 50000 units CAPS capsule, Take 50,000 Units by mouth once a week., Disp: , Rfl:   Introduction:  This is a 19 channel routine scalp EEG performed at the bedside with bipolar and monopolar montages arranged in accordance to the international 10/20 system of electrode placement. One channel was dedicated to EKG recording.    Findings:  The background rhythm was normal 5-6 Hz alpha . No definite evidence of abnormal epileptiform discharges or electrographic seizures were noted during this recording.   Impression:  Abnormal  routine inpatient EEG suggestive of moderate encephalopathy.  Clinical correlation is recommended .

## 2016-01-29 NOTE — H&P (Signed)
Date: 01/29/2016               Patient Name:  Alyssa Floyd MRN: [id number]  DOB: September 25, 1966 Age / Sex: 50 y.o., female   PCP: Steele Sizer, MD         Medical Service: Internal Medicine Teaching Service         Attending Physician: Dr. Sid Falcon, MD    First Contact: Dr. Benjamine Mola Pager: [phone]  Second Contact: Dr. 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  History limited due to patient's dysarthria.   Per chart review she was last seen normal at 3:30 this morning. Per mother she has been feeling weak with unsteady gait and had to use a cane to ambulate to the restroom. She was too weak to arise from the toliet. She was found to have dysarthria (6:30 AM) and facial droop on EMS arrival. Code stroke was activated on ED arrival. CT head was normal. MRI brain did not show evidence of CVA or white matter disease to suggest PRESS. She denies focal weakness, paraesthesias, dysphagia, changes in vision, headache, dizziness, inability to close eyes, ear pain, drooling, shaking, or history of similar symptoms in the past. She denies recent illness, history of CVA, history of seizures, or Bell's Palsy. She is able to rise her eyebrows.   She has Type 1 diabetes mellitus (dx 13 yrs ago) that is managed at [facility] with last visit on 01/26/16. Her A1c was 11.4 on 12/18/15. She reports compliance with taking Lantus 14 U daily (decreased from 16 U daily on 01/26/16) and Humalog 6 U TID with meals. Her blood sugars at home are  usually low. Her blood sugar was 1361 on arrival. She was recently hospitalized at [facility] from 1/30 - 2/10 for [name]. She reports polydipsia and polyuria. She reports compliance with taking torsemide 50 mg BID for chronic LE edema in setting of nephrotic syndrome from diabetic nephropathy.    PSH: Partial hysterectomy  FH: Diabetes SH: Smokes 6 cigarettes daily, no alcohol or drug use All: PCN  Meds:  No current facility-administered medications on file prior to encounter.   Current Outpatient Prescriptions on File Prior to Encounter  Medication Sig Dispense Refill  . insulin glargine (LANTUS) 100 UNIT/ML injection Inject 45 Units into the skin at bedtime.    . Protein POWD 25 g by Does not apply route 2 (two) times daily. 454 g 0     Allergies: Allergies as of 01/29/2016 - Review Complete 01/29/2016  Allergen Reaction Noted  . Penicillins Other (See Comments) 12/20/2015   Past Medical History  Diagnosis Date  . Diabetes mellitus without complication ([place])   . Hypertension   . Hyperlipidemia   . Nephrotic syndrome     diabetic nephropathy, biopst on 01/06/16 at [facility]  . Subclinical hypothyroidism   . Vitamin D deficiency   . Chronic diarrhea   . Tobacco use   . Normocytic anemia    Past Surgical History  Procedure Laterality Date  . Partial hysterectomy     Family  History  Problem Relation Age of Onset  . Diabetes     Social History   Social History  . Marital Status: Legally Separated    Spouse Name: N/A  . Number of Children: N/A  . Years of Education: N/A   Occupational History  . Not on file.   Social History Main Topics  . Smoking status: Current Every Day Smoker -- 0.50 packs/day    Types: Cigarettes  . Smokeless tobacco: Never Used  . Alcohol Use: No  . Drug Use: No  . Sexual Activity: Not on file   Other Topics Concern  . Not on file   Social History Narrative    Review of Systems: Review of Systems  Constitutional: Negative for fever, chills  and weight loss.  HENT: Negative for ear pain and sore throat.   Eyes: Negative for blurred vision and double vision.  Respiratory: Negative for cough, shortness of breath and wheezing.   Cardiovascular: Positive for leg swelling (chronic b/l LE). Negative for chest pain.  Gastrointestinal: Positive for diarrhea (chronic). Negative for nausea, vomiting, abdominal pain, constipation and blood in stool.  Genitourinary: Negative for dysuria, urgency and frequency.       Polyuria   Musculoskeletal: Negative for myalgias.  Neurological: Positive for speech change and weakness. Negative for dizziness, sensory change, focal weakness, seizures and headaches.       Facial droop, unsteady gait  Endo/Heme/Allergies: Positive for polydipsia.    Physical Exam: Blood pressure 144/73, pulse 66, resp. rate 16, SpO2 100 %.  Physical Exam  Constitutional: She appears well-developed and well-nourished. No distress.  HENT:  Head: Normocephalic and atraumatic.  Right Ear: External ear normal.  Left Ear: External ear normal.  Nose: Nose normal.  Mouth/Throat: Oropharyngeal exudate (Oral thrush) present.  Eyes: Conjunctivae and EOM are normal. Pupils are equal, round, and reactive to light. Right eye exhibits no discharge. Left eye exhibits no discharge. No scleral icterus.  Mild proptosis   Neck: Normal range of motion. Neck supple.  Cardiovascular: Normal rate, regular rhythm and normal heart sounds.   Pulmonary/Chest: Effort normal and breath sounds normal. No respiratory distress. She has no wheezes. She has no rales.  Abdominal: Soft. Bowel sounds are normal. She exhibits no distension. There is no tenderness. There is no rebound and no guarding.  Musculoskeletal: Normal range of motion. She exhibits edema (+-2 b/l LE ). She exhibits no tenderness.  Neurological: She is alert. She has normal reflexes. She displays normal reflexes. A cranial nerve deficit (Loss of left nasolabial fold with drooping of  lower lip. Preservation of forehead and brow movement.) is present. She exhibits normal muscle tone.  Orientated x 2 (self, place). Dysarthric. Normal 5/5 muscle strength throughout. Normal sensation to light touch of extremities.   Skin: Skin is warm and dry. No rash noted. She is not diaphoretic. No erythema. No pallor.  Psychiatric: She has a normal mood and affect. Her behavior is normal. Judgment and thought content normal.     Lab results: Basic Metabolic Panel:  Recent Labs  01/29/16 0904  NA 120*  120*  K 4.8  4.7  CL 79*  83*  CO2 16*  GLUCOSE 1361*  >700*  BUN 80*  71*  CREATININE 3.04*  2.40*  CALCIUM 8.1*   Liver Function Tests:  Recent Labs  01/29/16 0904  AST 19  ALT 22  ALKPHOS 122  BILITOT 1.0  PROT 5.2*  ALBUMIN 2.0*   CBC:  Recent Labs  01/29/16 0904  WBC 10.5  NEUTROABS 9.1*  HGB 11.2*  10.2*  HCT 33.0*  31.8*  MCV 86.9  PLT 300   CBG:  Recent Labs  01/29/16 0914  GLUCAP >600*   Coagulation:  Recent Labs  01/29/16 0904  LABPROT 12.6  INR 0.92   Urine Drug Screen:  Alcohol Level:  Recent Labs  01/29/16 0904  ETH <5   Urinalysis: No results for input(s): COLORURINE, LABSPEC, PHURINE, GLUCOSEU, HGBUR, BILIRUBINUR, KETONESUR, PROTEINUR, UROBILINOGEN, NITRITE, LEUKOCYTESUR in the last 72 hours.  Invalid input(s): APPERANCEUR   Imaging results:  Ct Head Wo Contrast  01/29/2016  ADDENDUM REPORT: 01/29/2016 09:24 ADDENDUM: Critical Value/emergent results were called by telephone at the time of interpretation on 01/29/2016 at 9:20 am to Etta Quill, [place] , who verbally acknowledged these results. Electronically Signed   By: Lowella Grip III M.D.   On: 01/29/2016 09:24  01/29/2016  CLINICAL DATA:  Acute onset slurred speech and left-sided facial droop EXAM: CT HEAD WITHOUT CONTRAST TECHNIQUE: Contiguous axial images were obtained from the base of the skull through the vertex without intravenous contrast. COMPARISON:  None.  FINDINGS: The ventricles are normal in size and configuration. There is no evidence of mass, hemorrhage, extra-axial fluid collection, or midline shift. Gray-white compartments appear normal. No acute infarct evident. Middle cerebral artery attenuation is symmetric bilaterally. Bony calvarium appears intact. The mastoid air cells are clear. No intraorbital lesions are apparent. IMPRESSION: Study within normal limits. In particular, no edema, hemorrhage, or evidence of acute infarct. Electronically Signed: By: Lowella Grip III M.D. On: 01/29/2016 09:12   Mr Brain Ltd W/o Cm  01/29/2016  CLINICAL DATA:  New onset of weakness and slurred speech noted at 6:30 a.m. today. Hyperglycemia. Symptoms compatible with left-sided Bell's palsy. EXAM: MRI HEAD WITHOUT CONTRAST TECHNIQUE: Multiplanar, multiecho pulse sequences of the brain and surrounding structures were obtained without intravenous contrast. COMPARISON:  Negative CT of the head without contrast from the same day. FINDINGS: Diffusion-weighted images demonstrate no evidence for acute or subacute infarction. Axial T2 and gradient echo images were also performed. There is no significant white matter disease. No acute hemorrhage or mass lesion is present. The ventricles are of normal size. Insert pass fluid The internal auditory canals are within normal limits bilaterally. Flow is present in the major intracranial arteries. Bilateral lens replacements are noted. The globes and orbits otherwise intact. Mild mucosal thickening is present in the anterior ethmoid air cells. The remaining paranasal sinuses are clear. There is fluid in the mastoid air cells bilaterally, left greater than right. IMPRESSION: 1. No evidence for acute infarct. 2. No focal white matter disease to suggest press. 3. Left greater than right mastoid fluid. No obstructing nasopharyngeal lesion is present. Electronically Signed   By: San Morelle M.D.   On: 01/29/2016 10:33    Other  results: EKG: .  Vent. rate 75 BPM PR interval 152 ms QRS duration 160 ms QT/QTc 523/584 ms P-R-T axes 41 104 40  Sinus rhythm Nonspecific intraventricular conduction delay Repol abnrm, severe global ischemia (LM/MVD) diffuse nonspecific st changes   Assessment & Plan by Problem:  [name] in setting of uncontrolled Type 1 DM -  Pt with BG of 1361 and bicarb 16 with no pH or UA most likely due to [name] which she recently hospitalized for at [facility] from 1/30-2/10. Last  A1c was 11.4 on 12/18/15. She reports compliance with taking Lantus 14 U daily (decreased from 16 U daily on 01/26/16) and Humalog 6 U TID  with meals. Her blood sugars at home are usually low. No infectious or ischemic etiology. Most likely due to dietary indiscretion vs noncompliance vs inadequate insulin dosage.  -Admit to stepdown and keep NPO -Continue IV insulin via [name] protocol -Start NS 100 mL/hr x 12 hrs  (received 2 L NS in ED) -BMP Q 4 hr until AG closes x 2 -Replete potasium as needed to keep >4  -Follow-up UA for urine ketones  -Follow-up diabetes coordinator recommendations   Facial Paralysis and Dysarthria - Pt with loss of left nasolabial fold and droop with preservation of forehead and brow movements suggesting central paralysis but MRI brain normal and lack of other neurological symptoms suggest peripheral paralysis most likely due to Bell's palsy from uncontrolled DM which is common in diabetics. Pt denies recent illness or history of HSV infection.  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 Etiology most likely pre-renal azotemia in setting of volume depletion from [name]. Pt has diabetic nephropathy with nephrotic syndrome diagnosed by kidney biopsy on 01/06/16. Renal US on 01/07/16 was normal. She had 2.2 of proteinuria on  12/30/15.  -NS 100 ml/hr x 12 hrs -Hold home torsemide 50 mg BID and lisinopril 80 mg daily  -Daily weights and strict I/O -Avoid nephrotoxins  Oral Thrush - Pt with scrapable white plaques on tongue in setting of uncontrolled DM. -Start nystatin wash QID until resolves  -Obtain HIV Ab   Hypertension - Currently normotensive  -Hold home lisinopril 80 mg daily and torsemide 50 mg BID  Hyperlipidemia - Last lipid panel on 12/28/15 with LDL 112.  -Hold home atorvastatin 40 mg daily   Subclinical Hypothyroidism - Last TSH 8.43 with normal free T4 on 01/03/16. Pt not on replacement therapy.  -Obtain anti-TPO -Pt follows with [facility] end[facility]   Chronic normocytic anemia - Hg 10.2 at baseline with no active bleeding. Last anemia panel with ferritin 182, TIBC low at 170, iron low at 14, and Tstat low at 8%. Pt is s/p hysterectomy.  -Hold home ferrous fumarate 324 mg daily  -Monitor CBC  Chronic diarrhea - Pt at home on pancreatic replacement and imodium.  -Hold home pancrelipase 2 capsules TID with meals until PO  Tobacco Use - Pt reports smoking 6 cigarettes daily.  -Pt counseled on cessation    Diet: NPO DVT PPx: Lovenox  Code: Full (need to confirm)  Dispo: Disposition is deferred at this time, awaiting improvement of current medical problems. Anticipated discharge in approximately 2- 5 day(s).   The patient does have a current PCP Steele Sizer, MD) and does need an [facility] hospital follow-up appointment after discharge.  The patient does have transportation limitations that hinder transportation to clinic appointments.  Signed: Juluis Mire, MD 01/29/2016, 11:38 AM

## 2016-01-29 NOTE — ED Notes (Signed)
Reported to Dr.Truong that pt. Has developed Nystagmus.  She asked me to call Neurology

## 2016-01-29 NOTE — ED Provider Notes (Signed)
CSN: [id number]     Arrival date & time 01/29/16  [id number] History   None    No chief complaint on file.    Level 5 caveat: Dysarthria/aphasia  HPI Patient is brought to the emergency department as a code stroke as she was noted to be dysarthric with a left-sided facial droop and generally weak.  She was found to have a critical high blood sugar was brought to the emergency department by [facility].  [place] emergency department she is dysarthric and does appear to have a small facial droop.  She was seen emergently by neurology and after head CT which showed showed no acute abnormality was found to have a left-sided Bell's palsy with associated hyperglycemia.  Patient reports she's been compliant with her diabetic medications.   Past Medical History  Diagnosis Date  . Diabetes mellitus without complication ([place])   . Hypertension    Past Surgical History  Procedure Laterality Date  . Partial hysterectomy     No family history on file. Social History  Substance Use Topics  . Smoking status: Current Every Day Smoker -- 0.50 packs/day    Types: Cigarettes  . Smokeless tobacco: Never Used  . Alcohol Use: No   OB History    No data available     Review of Systems  Unable to perform ROS: Other (Dysarthria/aphasia)      Allergies  Penicillins  Home Medications   Prior to Admission medications   Medication Sig Start Date End Date Taking? Authorizing Provider  insulin glargine (LANTUS) 100 UNIT/ML injection Inject 45 Units into the skin at bedtime.    Historical Provider, MD  Protein POWD 25 g by Does not apply route 2 (two) times daily. 12/20/15   Harvest Dark, MD   BP 145/78 mmHg  Pulse 78  Resp 16  SpO2 100% Physical Exam  ED Course  Procedures (including critical care time)  CRITICAL CARE Performed by: Hoy Morn Total critical care time: 35 minutes Critical care time was exclusive of separately billable procedures and treating other  patients. Critical care was necessary to treat or prevent imminent or life-threatening deterioration. Critical care was time spent personally by me on the following activities: development of treatment plan with patient and/or surrogate as well as nursing, discussions with consultants, evaluation of patient's response to treatment, examination of patient, obtaining history from patient or surrogate, ordering and performing treatments and interventions, ordering and review of laboratory studies, ordering and review of radiographic studies, pulse oximetry and re-evaluation of patient's condition.   Labs Review Labs Reviewed  APTT - Abnormal; Notable for the following:    aPTT 21 (*)    All other components within normal limits  CBC - Abnormal; Notable for the following:    RBC 3.66 (*)    Hemoglobin 10.2 (*)    HCT 31.8 (*)    All other components within normal limits  DIFFERENTIAL - Abnormal; Notable for the following:    Neutro Abs 9.1 (*)    All other components within normal limits  COMPREHENSIVE METABOLIC PANEL - Abnormal; Notable for the following:    Sodium 120 (*)    Chloride 79 (*)    CO2 16 (*)    Glucose, Bld 1361 (*)    BUN 80 (*)    Creatinine, Ser 3.04 (*)    Calcium 8.1 (*)    Total Protein 5.2 (*)    Albumin 2.0 (*)    GFR calc non Af Amer 17 (*)  GFR calc Af Amer 20 (*)    Anion gap 25 (*)    All other components within normal limits  BASIC METABOLIC PANEL - Abnormal; Notable for the following:    Sodium 130 (*)    Chloride 89 (*)    Glucose, Bld 972 (*)    BUN 73 (*)    Creatinine, Ser 2.67 (*)    Calcium 8.1 (*)    GFR calc non Af Amer 20 (*)    GFR calc Af Amer 23 (*)    Anion gap 17 (*)    All other components within normal limits  PHOSPHORUS - Abnormal; Notable for the following:    Phosphorus 5.7 (*)    All other components within normal limits  I-STAT CHEM 8, ED - Abnormal; Notable for the following:    Sodium 120 (*)    Chloride 83 (*)    BUN  71 (*)    Creatinine, Ser 2.40 (*)    Glucose, Bld >700 (*)    Calcium, Ion 0.94 (*)    Hemoglobin 11.2 (*)    HCT 33.0 (*)    All other components within normal limits  CBG MONITORING, ED - Abnormal; Notable for the following:    Glucose-Capillary >600 (*)    All other components within normal limits  CBG MONITORING, ED - Abnormal; Notable for the following:    Glucose-Capillary >600 (*)    All other components within normal limits  CBG MONITORING, ED - Abnormal; Notable for the following:    Glucose-Capillary >600 (*)    All other components within normal limits  CBG MONITORING, ED - Abnormal; Notable for the following:    Glucose-Capillary >600 (*)    All other components within normal limits  MRSA PCR SCREENING  ETHANOL  PROTIME-INR  MAGNESIUM  URINE RAPID DRUG SCREEN, HOSP PERFORMED  URINALYSIS, ROUTINE W REFLEX MICROSCOPIC (NOT AT [facility])  BASIC METABOLIC PANEL  BASIC METABOLIC PANEL  BASIC METABOLIC PANEL  HIV ANTIBODY (ROUTINE TESTING)  THYROID PEROXIDASE ANTIBODY  I-STAT TROPOININ, ED    Imaging Review Ct Head Wo Contrast  01/29/2016  ADDENDUM REPORT: 01/29/2016 09:24 ADDENDUM: Critical Value/emergent results were called by telephone at the time of interpretation on 01/29/2016 at 9:20 am to Etta Quill, PA , who verbally acknowledged these results. Electronically Signed   By: Lowella Grip III M.D.   On: 01/29/2016 09:24  01/29/2016  CLINICAL DATA:  Acute onset slurred speech and left-sided facial droop EXAM: CT HEAD WITHOUT CONTRAST TECHNIQUE: Contiguous axial images were obtained from the base of the skull through the vertex without intravenous contrast. COMPARISON:  None. FINDINGS: The ventricles are normal in size and configuration. There is no evidence of mass, hemorrhage, extra-axial fluid collection, or midline shift. Gray-white compartments appear normal. No acute infarct evident. Middle cerebral artery attenuation is symmetric bilaterally. Bony calvarium appears  intact. The mastoid air cells are clear. No intraorbital lesions are apparent. IMPRESSION: Study within normal limits. In particular, no edema, hemorrhage, or evidence of acute infarct. Electronically Signed: By: Lowella Grip III M.D. On: 01/29/2016 09:12   Mr Brain Ltd W/o Cm  01/29/2016  CLINICAL DATA:  New onset of weakness and slurred speech noted at 6:30 a.m. today. Hyperglycemia. Symptoms compatible with left-sided Bell's palsy. EXAM: MRI HEAD WITHOUT CONTRAST TECHNIQUE: Multiplanar, multiecho pulse sequences of the brain and surrounding structures were obtained without intravenous contrast. COMPARISON:  Negative CT of the head without contrast from the same day. FINDINGS: Diffusion-weighted images demonstrate no  evidence for acute or subacute infarction. Axial T2 and gradient echo images were also performed. There is no significant white matter disease. No acute hemorrhage or mass lesion is present. The ventricles are of normal size. Insert pass fluid The internal auditory canals are within normal limits bilaterally. Flow is present in the major intracranial arteries. Bilateral lens replacements are noted. The globes and orbits otherwise intact. Mild mucosal thickening is present in the anterior ethmoid air cells. The remaining paranasal sinuses are clear. There is fluid in the mastoid air cells bilaterally, left greater than right. IMPRESSION: 1. No evidence for acute infarct. 2. No focal white matter disease to suggest press. 3. Left greater than right mastoid fluid. No obstructing nasopharyngeal lesion is present. Electronically Signed   By: San Morelle M.D.   On: 01/29/2016 10:33   I have personally reviewed and evaluated these images and lab results as part of my medical decision-making.   EKG Interpretation   Date/Time:  Friday January 29 2016 10:33:19 EST Ventricular Rate:  75 PR Interval:  152 QRS Duration: 160 QT Interval:  523 QTC Calculation: 584 R Axis:   104 Text  Interpretation:  Sinus rhythm Nonspecific intraventricular conduction  delay Repol abnrm, severe global ischemia (LM/MVD) diffuse nonspecific st  changes Confirmed by Niklas Chretien  MD, Mariel Gaudin ([id number]) on 01/29/2016 10:39:19 AM      MDM   Final diagnoses:  Dysarthria  Hyperglycemia  Acute renal failure, unspecified acute renal failure type ([place])  Dehydration    [place] emergency department as a code stroke.  No indication for TPA at this time.  Aggressive management of her hyperglycemia with IV insulin and insulin drip was initiated.  She was given 2 L of IV normal saline first auscultation.  Her sodium is 120 but when corrected for her hyperglycemia is asked to 150.  She'll benefit from ongoing hydration.  Patient be admitted to the stepdown unit.  Acute Limited MRI demonstrates no obvious infarct.  Neurohospitalist consultation, [facility] admission    Jola Schmidt, MD 01/29/16 [phone]

## 2016-01-29 NOTE — Code Documentation (Signed)
50yo female arriving to [facility] via [facility] at [phone].  Per EMS patient woke up at 0330 at her baseline and went back to bed.  She later went to the BR at 0630 and was noted by her family to be weak and required assistance getting off the toilet.  She was noted to have slurred speech at that time.  EMS called and activated a code stroke. EMS reports that patient's CBG was too high to register.  Patient to CT on arrival.  Stroke team to the bedside.  Patient is outside the window for treatment with tPA.  CBG reading >600.  Dr. Silverio Decamp to the bedside.  Patient to go to MRI.  Bedside handoff with ED RN Cecille Rubin.

## 2016-01-29 NOTE — ED Notes (Signed)
RN attempt to call report to floor; RN to call back

## 2016-01-29 NOTE — Progress Notes (Signed)
Received page from RN stating that patient developed a new nystagmus. Informed RN that I would be down to see the patient and to also inform neurology of the new change in her neuro status. Went to bedside and discussed case with Dr. Silverio Decamp. MRI imaging of her head was reviewed together which was negative for an acute CVA. Her EEG was negative for seizure activity. Likely her nystagmus is due to metabolic encephalopathy in setting of her hyperglycemia.   - Will continue neuro checks - UA has still not been collected, per RN patient is incontinent and has been wetting her bed every hour. Asked RN to place bed pan to collect urine specimen after which she can remove the bed pan as they do not like to keep patient on the bedpan for too long. Would like to hold off on foley cath if possible.    Julious Oka, MD Internal Medicine Resident, PGY [facility] Pager: [phone]

## 2016-01-29 NOTE — ED Notes (Signed)
Unable to get an urine specimen, pt. Voided on her sheets and clothes.

## 2016-01-29 NOTE — ED Notes (Signed)
Pt. Placed in Room after MRI is completed.

## 2016-01-29 NOTE — ED Notes (Signed)
Called Charge RN [place], to report to Dr. Venora Maples,  Lab called and reported Glucose is 1361

## 2016-01-29 NOTE — ED Notes (Signed)
EEG being done at the bedside

## 2016-01-29 NOTE — ED Notes (Signed)
Admitting at bedside 

## 2016-01-29 NOTE — Progress Notes (Signed)
EEG Completed; Results Pending  

## 2016-01-29 NOTE — ED Notes (Signed)
Pt. Arrived via [facility] ambulance as a code stroke.  Pt. Arrived with garbled /slurred speech, lt. Facial droop and GCS 14.  Pt. Denies any pain or discomfort (She shakes her head no)  She is oriented to self , but not place or time.  Pt. Is maintaining airway. No distress noted.  Paramedics reports that pt.s cousin was talking with her at 330 am and she was fine.  When pt. Got up to go to the bathroom, her gait was unsteady and she was unable to raise her self off the toilet.  Her cousin then called 33.   Vitals within normal limits.    Stroke team met pt at the bridge along with the EDP

## 2016-01-29 NOTE — ED Notes (Signed)
EEG completed.

## 2016-01-29 NOTE — Progress Notes (Signed)
Inpatient Diabetes Program Recommendations  AACE/ADA: New Consensus Statement on Inpatient Glycemic Control (2015)  Target Ranges:  Prepandial:   less than 140 mg/dL      Peak postprandial:   less than 180 mg/dL (1-2 hours)      Critically ill patients:  140 - 180 mg/dL   Review of Glycemic Control Results for ABIGAEL, BROHL (MRN [id number]) as of 01/29/2016 12:05  Ref. Range 01/29/2016 09:04  Glucose Latest Ref Range: 65-99 mg/dL 1361 (HH)   Diabetes history: Type 1 diabetes Outpatient Diabetes medications: Lantus 14 units daily, Humalog 6 units tid with meals Current orders for Inpatient glycemic control:  IV insulin  Inpatient Diabetes Program Recommendations:    Note per Care Everywhere, patient see's endocrinologist at [facility].  She was just seen on February 28th and insulin doses were adjusted.  She also had an admit to [facility] on 12/28/15 for [name].  A1C in January, 2017 was 11.4%. Will follow.  Thanks, Adah Perl, RN, BC-ADM Inpatient Diabetes Coordinator Pager [phone] (8a-5p)

## 2016-01-30 DIAGNOSIS — E038 Other specified hypothyroidism: Secondary | ICD-10-CM

## 2016-01-30 DIAGNOSIS — B370 Candidal stomatitis: Secondary | ICD-10-CM

## 2016-01-30 DIAGNOSIS — R471 Dysarthria and anarthria: Secondary | ICD-10-CM

## 2016-01-30 DIAGNOSIS — E1021 Type 1 diabetes mellitus with diabetic nephropathy: Secondary | ICD-10-CM

## 2016-01-30 DIAGNOSIS — E1101 Type 2 diabetes mellitus with hyperosmolarity with coma: Secondary | ICD-10-CM | POA: Insufficient documentation

## 2016-01-30 DIAGNOSIS — G40909 Epilepsy, unspecified, not intractable, without status epilepticus: Secondary | ICD-10-CM | POA: Insufficient documentation

## 2016-01-30 DIAGNOSIS — N179 Acute kidney failure, unspecified: Secondary | ICD-10-CM

## 2016-01-30 DIAGNOSIS — G510 Bell's palsy: Secondary | ICD-10-CM

## 2016-01-30 DIAGNOSIS — I10 Essential (primary) hypertension: Secondary | ICD-10-CM

## 2016-01-30 DIAGNOSIS — D649 Anemia, unspecified: Secondary | ICD-10-CM

## 2016-01-30 DIAGNOSIS — K629 Disease of anus and rectum, unspecified: Secondary | ICD-10-CM

## 2016-01-30 LAB — GLUCOSE, CAPILLARY
GLUCOSE-CAPILLARY: 78 mg/dL (ref 65–99)
GLUCOSE-CAPILLARY: 90 mg/dL (ref 65–99)
GLUCOSE-CAPILLARY: 156 mg/dL — AB (ref 65–99)
GLUCOSE-CAPILLARY: 170 mg/dL — AB (ref 65–99)
GLUCOSE-CAPILLARY: 109 mg/dL — AB (ref 65–99)
GLUCOSE-CAPILLARY: 105 mg/dL — AB (ref 65–99)
GLUCOSE-CAPILLARY: 125 mg/dL — AB (ref 65–99)
GLUCOSE-CAPILLARY: 220 mg/dL — AB (ref 65–99)
Glucose-Capillary: 143 mg/dL — ABNORMAL HIGH (ref 65–99)
Glucose-Capillary: 244 mg/dL — ABNORMAL HIGH (ref 65–99)
Glucose-Capillary: 195 mg/dL — ABNORMAL HIGH (ref 65–99)
Glucose-Capillary: 167 mg/dL — ABNORMAL HIGH (ref 65–99)
Glucose-Capillary: 109 mg/dL — ABNORMAL HIGH (ref 65–99)
Glucose-Capillary: 219 mg/dL — ABNORMAL HIGH (ref 65–99)

## 2016-01-30 LAB — BASIC METABOLIC PANEL
ANION GAP: 8 (ref 5–15)
ANION GAP: 9 (ref 5–15)
ANION GAP: 7 (ref 5–15)
ANION GAP: 8 (ref 5–15)
ANION GAP: 11 (ref 5–15)
Anion gap: 13 (ref 5–15)
BUN: 60 mg/dL — ABNORMAL HIGH (ref 6–20)
BUN: 58 mg/dL — ABNORMAL HIGH (ref 6–20)
BUN: 56 mg/dL — AB (ref 6–20)
BUN: 51 mg/dL — ABNORMAL HIGH (ref 6–20)
BUN: 44 mg/dL — ABNORMAL HIGH (ref 6–20)
BUN: 44 mg/dL — ABNORMAL HIGH (ref 6–20)
CALCIUM: 8 mg/dL — AB (ref 8.9–10.3)
CALCIUM: 8 mg/dL — AB (ref 8.9–10.3)
CALCIUM: 8 mg/dL — AB (ref 8.9–10.3)
CHLORIDE: 100 mmol/L — AB (ref 101–111)
CHLORIDE: 100 mmol/L — AB (ref 101–111)
CO2: 30 mmol/L (ref 22–32)
CO2: 26 mmol/L (ref 22–32)
CO2: 28 mmol/L (ref 22–32)
CO2: 29 mmol/L (ref 22–32)
CO2: 28 mmol/L (ref 22–32)
CO2: 25 mmol/L (ref 22–32)
CREATININE: 1.93 mg/dL — AB (ref 0.44–1.00)
Calcium: 8.2 mg/dL — ABNORMAL LOW (ref 8.9–10.3)
Calcium: 7.7 mg/dL — ABNORMAL LOW (ref 8.9–10.3)
Calcium: 7.9 mg/dL — ABNORMAL LOW (ref 8.9–10.3)
Chloride: 102 mmol/L (ref 101–111)
Chloride: 101 mmol/L (ref 101–111)
Chloride: 103 mmol/L (ref 101–111)
Chloride: 102 mmol/L (ref 101–111)
Creatinine, Ser: 1.94 mg/dL — ABNORMAL HIGH (ref 0.44–1.00)
Creatinine, Ser: 1.89 mg/dL — ABNORMAL HIGH (ref 0.44–1.00)
Creatinine, Ser: 1.68 mg/dL — ABNORMAL HIGH (ref 0.44–1.00)
Creatinine, Ser: 1.69 mg/dL — ABNORMAL HIGH (ref 0.44–1.00)
Creatinine, Ser: 1.66 mg/dL — ABNORMAL HIGH (ref 0.44–1.00)
GFR calc Af Amer: 35 mL/min — ABNORMAL LOW (ref >60)
GFR calc Af Amer: 40 mL/min — ABNORMAL LOW (ref >60)
GFR calc Af Amer: 40 mL/min — ABNORMAL LOW (ref >60)
GFR calc Af Amer: 41 mL/min — ABNORMAL LOW (ref >60)
GFR calc non Af Amer: 29 mL/min — ABNORMAL LOW (ref >60)
GFR calc non Af Amer: 30 mL/min — ABNORMAL LOW (ref >60)
GFR calc non Af Amer: 35 mL/min — ABNORMAL LOW (ref >60)
GFR calc non Af Amer: 34 mL/min — ABNORMAL LOW (ref >60)
GFR calc non Af Amer: 35 mL/min — ABNORMAL LOW (ref >60)
GFR, EST AFRICAN AMERICAN: 34 mL/min — AB (ref >60)
GFR, EST AFRICAN AMERICAN: 34 mL/min — AB (ref >60)
GFR, EST NON AFRICAN AMERICAN: 29 mL/min — AB (ref >60)
GLUCOSE: 80 mg/dL (ref 65–99)
GLUCOSE: 160 mg/dL — AB (ref 65–99)
GLUCOSE: 120 mg/dL — AB (ref 65–99)
GLUCOSE: 216 mg/dL — AB (ref 65–99)
GLUCOSE: 190 mg/dL — AB (ref 65–99)
Glucose, Bld: 153 mg/dL — ABNORMAL HIGH (ref 65–99)
POTASSIUM: 3.4 mmol/L — AB (ref 3.5–5.1)
POTASSIUM: 3.8 mmol/L (ref 3.5–5.1)
POTASSIUM: 3.8 mmol/L (ref 3.5–5.1)
Potassium: 3.2 mmol/L — ABNORMAL LOW (ref 3.5–5.1)
Potassium: 3.3 mmol/L — ABNORMAL LOW (ref 3.5–5.1)
Potassium: 3.5 mmol/L (ref 3.5–5.1)
SODIUM: 139 mmol/L (ref 135–145)
Sodium: 140 mmol/L (ref 135–145)
Sodium: 138 mmol/L (ref 135–145)
Sodium: 139 mmol/L (ref 135–145)
Sodium: 138 mmol/L (ref 135–145)
Sodium: 136 mmol/L (ref 135–145)

## 2016-01-30 LAB — MRSA PCR SCREENING: MRSA BY PCR: NEGATIVE

## 2016-01-30 LAB — CBG MONITORING, ED
Glucose-Capillary: 163 mg/dL — ABNORMAL HIGH (ref 65–99)
Glucose-Capillary: 63 mg/dL — ABNORMAL LOW (ref 65–99)

## 2016-01-30 LAB — ACETAMINOPHEN LEVEL

## 2016-01-30 LAB — SALICYLATE LEVEL

## 2016-01-30 LAB — HIV ANTIBODY (ROUTINE TESTING W REFLEX): HIV SCREEN 4TH GENERATION: NONREACTIVE

## 2016-01-30 LAB — THYROID PEROXIDASE ANTIBODY: THYROID PEROXIDASE ANTIBODY: 68 [IU]/mL — AB (ref 0–34)

## 2016-01-30 MED ORDER — CETYLPYRIDINIUM CHLORIDE 0.05 % MT LIQD
7.0000 mL | Freq: Two times a day (BID) | OROMUCOSAL | Status: DC
  Administered 2016-01-30 – 2016-01-31 (×4): 7 mL via OROMUCOSAL

## 2016-01-30 MED ORDER — INSULIN GLARGINE 100 UNIT/ML ~~LOC~~ SOLN
10.0000 [IU] | Freq: Every day | SUBCUTANEOUS | Status: DC
  Administered 2016-01-30 – 2016-02-01 (×3): 10 [IU] via SUBCUTANEOUS
  Filled 2016-01-30 (×3): qty 0.1

## 2016-01-30 MED ORDER — INSULIN ASPART 100 UNIT/ML ~~LOC~~ SOLN
0.0000 [IU] | Freq: Three times a day (TID) | SUBCUTANEOUS | Status: DC
  Administered 2016-01-30: 3 [IU] via SUBCUTANEOUS

## 2016-01-30 MED ORDER — DEXTROSE-NACL 5-0.45 % IV SOLN
INTRAVENOUS | Status: DC
  Administered 2016-01-30: 05:00:00 via INTRAVENOUS

## 2016-01-30 MED ORDER — FLUCONAZOLE 150 MG PO TABS
150.0000 mg | ORAL_TABLET | Freq: Once | ORAL | Status: AC
  Administered 2016-01-30: 150 mg via ORAL
  Filled 2016-01-30: qty 1

## 2016-01-30 MED ORDER — CHLORHEXIDINE GLUCONATE 0.12 % MT SOLN
15.0000 mL | Freq: Two times a day (BID) | OROMUCOSAL | Status: DC
  Administered 2016-01-30 – 2016-02-01 (×6): 15 mL via OROMUCOSAL
  Filled 2016-01-30 (×6): qty 15

## 2016-01-30 MED ORDER — POTASSIUM CHLORIDE 10 MEQ/100ML IV SOLN
10.0000 meq | INTRAVENOUS | Status: AC
  Administered 2016-01-30 (×3): 10 meq via INTRAVENOUS
  Filled 2016-01-30 (×4): qty 100

## 2016-01-30 MED ORDER — DEXTROSE 50 % IV SOLN
INTRAVENOUS | Status: AC
  Administered 2016-01-30: 15 mL
  Filled 2016-01-30: qty 50

## 2016-01-30 MED ORDER — LOPERAMIDE HCL 2 MG PO CAPS
2.0000 mg | ORAL_CAPSULE | ORAL | Status: DC | PRN
  Administered 2016-01-30: 2 mg via ORAL
  Filled 2016-01-30: qty 1

## 2016-01-30 MED ORDER — POTASSIUM CHLORIDE 10 MEQ/100ML IV SOLN
10.0000 meq | INTRAVENOUS | Status: AC
  Administered 2016-01-30 (×2): 10 meq via INTRAVENOUS
  Filled 2016-01-30: qty 100

## 2016-01-30 NOTE — Progress Notes (Signed)
CBG 90 - spoke with MD about lab results - will continue to monitor CBGs and lab values.  Pt has D5+1/2NS+K fluids running.  Insulin gtt on hold.  Will continue to monitor pt.

## 2016-01-30 NOTE — Progress Notes (Signed)
Subjective: Pt AAOx3. Having some abd pain, denies n/v. She states her niece who has been around her has been sick.   Objective: Vital signs in last 24 hours: Filed Vitals:   01/30/16 0141 01/30/16 0503 01/30/16 0656 01/30/16 0800  BP: 145/71 169/78 154/77   Pulse: 71 70 75   Temp: 98 F (36.7 C) 98 F (36.7 C)  97.5 F (36.4 C)  TempSrc: Oral Oral  Axillary  Resp: 15 16 14    Height: 5\' 7"  (1.702 m)     Weight: 154 lb 15.7 oz (70.3 kg)     SpO2: 100% 100% 99%    Weight change:   Intake/Output Summary (Last 24 hours) at 01/30/16 1119 Last data filed at 01/30/16 0800  Gross per 24 hour  Intake 4123.42 ml  Output    200 ml  Net 3923.42 ml   Physical Exam  Constitutional: She is oriented to person, place, and time. She appears well-developed and well-nourished. No distress.  HENT:  Head: Normocephalic and atraumatic.  Eyes: Conjunctivae and EOM are normal. Right eye exhibits no discharge. Left eye exhibits no discharge. No scleral icterus.  Cardiovascular: Normal rate, regular rhythm and normal heart sounds.  Exam reveals no gallop and no friction rub.   No murmur heard. Pulmonary/Chest: Effort normal and breath sounds normal. No respiratory distress. She has no wheezes. She has no rales.  Abdominal: Soft. Bowel sounds are normal. She exhibits no distension and no mass. There is tenderness (mild epigastric tenderness). There is no rebound and no guarding.  Musculoskeletal: She exhibits edema (2+ pedal edema b/l).  Neurological: She is alert and oriented to person, place, and time.  Skin: Skin is warm and dry. She is not diaphoretic.   Lab Results: Basic Metabolic Panel:  Recent Labs Lab 01/29/16 1401  01/30/16 0214 01/30/16 0453  NA  --   < > 140 139  K  --   < > 3.2* 3.3*  CL  --   < > 102 100*  CO2  --   < > 30 26  GLUCOSE  --   < > 80 153*  BUN  --   < > 60* 58*  CREATININE  --   < > 1.94* 1.93*  CALCIUM  --   < > 8.0* 8.2*  MG 2.4  --   --   --   PHOS  5.7*  --   --   --   < > = values in this interval not displayed. Liver Function Tests:  Recent Labs Lab 01/29/16 0904  AST 19  ALT 22  ALKPHOS 122  BILITOT 1.0  PROT 5.2*  ALBUMIN 2.0*   No results for input(s): LIPASE, AMYLASE in the last 168 hours. No results for input(s): AMMONIA in the last 168 hours. CBC:  Recent Labs Lab 01/29/16 0904  WBC 10.5  NEUTROABS 9.1*  HGB 11.2*  10.2*  HCT 33.0*  31.8*  MCV 86.9  PLT 300   CBG:  Recent Labs Lab 01/30/16 0354 01/30/16 0459 01/30/16 0555 01/30/16 0730 01/30/16 0836 01/30/16 0948  GLUCAP 143* 156* 244* 195* 167* 170*   Coagulation:  Recent Labs Lab 01/29/16 0904  LABPROT 12.6  INR 0.92    Urinalysis:  Recent Labs Lab 01/29/16 2317  COLORURINE YELLOW  LABSPEC 1.021  PHURINE 5.0  GLUCOSEU >1000*  HGBUR SMALL*  BILIRUBINUR SMALL*  KETONESUR NEGATIVE  PROTEINUR 100*  NITRITE NEGATIVE  LEUKOCYTESUR NEGATIVE   Micro Results: Recent Results (from the past  240 hour(s))  MRSA PCR Screening     Status: None   Collection Time: 01/29/16 10:58 PM  Result Value Ref Range Status   MRSA by PCR NEGATIVE NEGATIVE Final    Comment:        The GeneXpert MRSA Assay (FDA approved for NASAL specimens only), is one component of a comprehensive MRSA colonization surveillance program. It is not intended to diagnose MRSA infection nor to guide or monitor treatment for MRSA infections.    Studies/Results: Ct Head Wo Contrast  01/29/2016  ADDENDUM REPORT: 01/29/2016 09:24 ADDENDUM: Critical Value/emergent results were called by telephone at the time of interpretation on 01/29/2016 at 9:20 am to Etta Quill, [place] , who verbally acknowledged these results. Electronically Signed   By: Lowella Grip III M.D.   On: 01/29/2016 09:24  01/29/2016  CLINICAL DATA:  Acute onset slurred speech and left-sided facial droop EXAM: CT HEAD WITHOUT CONTRAST TECHNIQUE: Contiguous axial images were obtained from the base of the  skull through the vertex without intravenous contrast. COMPARISON:  None. FINDINGS: The ventricles are normal in size and configuration. There is no evidence of mass, hemorrhage, extra-axial fluid collection, or midline shift. Gray-white compartments appear normal. No acute infarct evident. Middle cerebral artery attenuation is symmetric bilaterally. Bony calvarium appears intact. The mastoid air cells are clear. No intraorbital lesions are apparent. IMPRESSION: Study within normal limits. In particular, no edema, hemorrhage, or evidence of acute infarct. Electronically Signed: By: Lowella Grip III M.D. On: 01/29/2016 09:12   Mr Brain Ltd W/o Cm  01/29/2016  CLINICAL DATA:  New onset of weakness and slurred speech noted at 6:30 a.m. today. Hyperglycemia. Symptoms compatible with left-sided Bell's palsy. EXAM: MRI HEAD WITHOUT CONTRAST TECHNIQUE: Multiplanar, multiecho pulse sequences of the brain and surrounding structures were obtained without intravenous contrast. COMPARISON:  Negative CT of the head without contrast from the same day. FINDINGS: Diffusion-weighted images demonstrate no evidence for acute or subacute infarction. Axial T2 and gradient echo images were also performed. There is no significant white matter disease. No acute hemorrhage or mass lesion is present. The ventricles are of normal size. Insert pass fluid The internal auditory canals are within normal limits bilaterally. Flow is present in the major intracranial arteries. Bilateral lens replacements are noted. The globes and orbits otherwise intact. Mild mucosal thickening is present in the anterior ethmoid air cells. The remaining paranasal sinuses are clear. There is fluid in the mastoid air cells bilaterally, left greater than right. IMPRESSION: 1. No evidence for acute infarct. 2. No focal white matter disease to suggest press. 3. Left greater than right mastoid fluid. No obstructing nasopharyngeal lesion is present. Electronically  Signed   By: San Morelle M.D.   On: 01/29/2016 10:33   Medications: I have reviewed the patient's current medications. Scheduled Meds: . antiseptic oral rinse  7 mL Mouth Rinse q12n4p  . chlorhexidine  15 mL Mouth Rinse BID  . enoxaparin (LOVENOX) injection  30 mg Subcutaneous Q24H  . nystatin  5 mL Oral QID  . sodium chloride flush  3 mL Intravenous Q12H   Continuous Infusions: . dextrose 5 % and 0.45% NaCl 75 mL/hr at 01/30/16 0431  . insulin (NOVOLIN-R) infusion 10 Units/hr (01/30/16 0730)   PRN Meds:.acetaminophen **OR** acetaminophen, promethazine Assessment/Plan: Principal Problem:   [name], type 1 ([place]) Active Problems:   Acute renal failure (ARF) (HCC)   Prolonged Q-T interval on ECG   Hyperlipidemia   Hypertension   Chronic diarrhea   Nephrotic syndrome  Tobacco use   Facial paralysis   Normocytic anemia   Subclinical hypothyroidism  HHS-- UA negative for ketones and betahydroxybutyrate negative. Plasma osm 325. Her presenting glucose was 1361 combine with her encephalopathy and negative ketones make HHS more likely than [name]. Her encephalopathy has resolved with control of her glucose.  - continue insulin gtt - follow BMET q4, once gap closes x 2 can resume diet and start lantus 10 units ( home dose is lantus 14 U w/ lunch and novlog 6 U with meals.) - replete potassium if less than 4  Facial paralysis and dysarthria-- resolved, likely from metabolic encephalopathy in setting of severe hyperglycemia. MRI negative. EEG neg for seizure activity - neurology following, appreciate their recommendations  - PT/OT consulted  Nonoliguric AKI with nephrotic syndrome due to diabetic nephropathy - Etiology most likely pre-renal azotemia in setting of volume depletion from [name]. Pt has diabetic nephropathy with nephrotic syndrome diagnosed by kidney biopsy on 01/06/16. Renal US on 01/07/16 was normal. -NS  -Hold home torsemide 50 mg BID and lisinopril 80 mg daily  -Daily  weights and strict I/O -Avoid nephrotoxins  Oral Thrush - Pt with scrapable white plaques on tongue in setting of uncontrolled DM. - nystatin wash QID until resolves  - HIV negative  Hypertension  -Hold home lisinopril 80 mg daily and torsemide 50 mg BID  Subclinical Hypothyroidism - Last TSH 8.43 with normal free T4 on 01/03/16. Pt not on replacement therapy.  -anti-TPO elevated, will need TSH checked annually.   -Pt follows with [facility] endocrinology   Chronic normocytic anemia - Hg 10.2 at baseline with no active bleeding. Last anemia panel with ferritin 182, TIBC low at 170, iron low at 14, and Tstat low at 8%. Pt is s/p hysterectomy.  -Hold home ferrous fumarate 324 mg daily while NPO -Monitor CBC  Chronic diarrhea - Pt at home on pancreatic replacement and imodium.  -Hold home pancrelipase 2 capsules TID with meals until PO  Diet: NPO DVT PPx: Lovenox  Code: Full   Dispo: Anticipated discharge in approximately 1-2 day(s).   The patient does have a current PCP Steele Sizer, MD) and does not need an [facility] hospital follow-up appointment after discharge.  The patient does not have transportation limitations that hinder transportation to clinic appointments.  .Services Needed at time of discharge: Y = Yes, Blank = No PT:   OT:   RN:   Equipment:   Other:     LOS: 1 day   Norman Herrlich, MD 01/30/2016, 11:19 AM

## 2016-01-30 NOTE — Progress Notes (Signed)
Interval History:                                                                                                                      Alyssa Floyd is an 50 y.o. female patient who presented with severe hyperglycemia with blood sugars greater than 900. History she had altered mental status with acute encephalopathy, square wave nystagmus, subtle left lower motor neuron facial weakness, dysarthria, facial twitching and asterixis. Her blood sugars have improved significantly, less than 200 consistently.  Clinically all of her symptoms have resolved, she is at her baseline with normal mental status. Denies any new neurological symptoms   Past Medical History: Past Medical History  Diagnosis Date  . Diabetes mellitus ([place])   . Hypertension   . Hyperlipidemia   . Nephrotic syndrome     diabetic nephropathy, biopsy on 01/06/16 at [facility]  . Subclinical hypothyroidism   . Vitamin D deficiency   . 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 Tobacco use   . Normocytic anemia   . Diabetic nephropathy [facility])     Past Surgical History  Procedure Laterality Date  . Partial hysterectomy      Family History: Family History  Problem Relation Age of Onset  . Diabetes      Social History:   reports that she has been smoking Cigarettes.  She has been smoking about 0.50 packs per day. She has never used smokeless tobacco. She reports that she does not drink alcohol or use illicit drugs.  Allergies:  Allergies  Allergen Reactions  . Penicillins Other (See Comments)    Pt reports hallucinations when taking penicillins. >Has patient had a PCN reaction causing immediate rash, facial/tongue/throat swelling, SOB or lightheadedness with hypotension: No Has patient had a PCN reaction causing severe rash involving mucus membranes or skin necrosis: No Has patient had a PCN reaction that required hospitalization No Has patient had a PCN reaction occurring within the last 10 years:  No If all of the above answers are "NO", then may proceed with Cephalosporin use.     Medications:                                                                                                                         Current facility-administered medications:  .  acetaminophen (TYLENOL) tablet 650 mg, 650 mg, Oral, Q6H PRN **OR** acetaminophen (TYLENOL) suppository 650 mg, 650 mg, Rectal, Q6H PRN, Juluis Mire, MD .  antiseptic oral rinse (CPC / CETYLPYRIDINIUM CHLORIDE 0.05%) solution 7 mL, 7 mL, Mouth Rinse, q12n4p, Sid Falcon, MD, 7 mL at 01/30/16 1509 .  chlorhexidine (PERIDEX) 0.12 % solution 15 mL, 15 mL, Mouth Rinse, BID, Sid Falcon, MD, 15 mL at 01/30/16 2101 .  enoxaparin (LOVENOX) injection 30 mg, 30 mg, Subcutaneous, Q24H, Rachel L Rumbarger, RPH, 30 mg at 01/30/16 1500 .  insulin aspart (novoLOG) injection 0-9 Units, 0-9 Units, Subcutaneous, TID WC, Norman Herrlich, MD, 3 Units at 01/30/16 1730 .  insulin glargine (LANTUS) injection 10 Units, 10 Units, Subcutaneous, Daily, Norman Herrlich, MD, 10 Units at 01/30/16 1500 .  insulin regular (NOVOLIN R,HUMULIN R) 250 Units in sodium chloride 0.9 % 250 mL (1 Units/mL) infusion, , Intravenous, Continuous, Jola Schmidt, MD, Stopped at 01/30/16 1700 .  loperamide (IMODIUM) capsule 2 mg, 2 mg, Oral, PRN, Jule Ser, DO, 2 mg at 01/30/16 2102 .  nystatin (MYCOSTATIN) 100000 UNIT/ML suspension 500,000 Units, 5 mL, Oral, QID, Marjan Rabbani, MD, 500,000 Units at 01/30/16 2102 .  promethazine (PHENERGAN) injection 12.5 mg, 12.5 mg, Intravenous, Q6H PRN, Marjan Rabbani, MD .  sodium chloride flush (NS) 0.9 % injection 3 mL, 3 mL, Intravenous, Q12H, Marjan Rabbani, MD, 3 mL at 01/30/16 2102   Neurologic Examination:                                                                                                      Blood pressure 143/86, pulse 87, temperature 98.1 F (36.7 C), temperature source Oral, resp. rate 22, height 5\' 7"   (1.702 m), weight 70.3 kg (154 lb 15.7 oz), SpO2 97 %.  Evaluation of higher integrative functions including: Level of alertness: Alert,  Oriented to time, place and person Speech: fluent, no evidence of dysarthria or aphasia noted.  Test the following cranial nerves: 2-12 grossly intact. No nystagmus, no facial eakness noted Motor examination: Normal tone, bulk, full 5/5 motor strength in all 4 extremities   Lab Results: Basic Metabolic Panel:  Recent Labs Lab 01/29/16 1401  01/30/16 0453 01/30/16 0940 01/30/16 1233 01/30/16 1647 01/30/16 2010  NA  --   < > 139 138 139 138 136  K  --   < > 3.3* 3.5 3.4* 3.8 3.8  CL  --   < > 100* 101 103 102 100*  CO2  --   < > 26 28 29 28 25   GLUCOSE  --   < > 153* 160* 120* 216* 190*  BUN  --   < > 58* 56* 51* 44* 44*  CREATININE  --   < > 1.93* 1.89* 1.68* 1.69* 1.66*  CALCIUM  --   < > 8.2* 8.0* 8.0* 7.7* 7.9*  MG 2.4  --   --   --   --   --   --   PHOS 5.7*  --   --   --   --   --   --   < > = values in this interval not displayed.  Liver Function Tests:  Recent Labs Lab 01/29/16 [phone]  AST 19  ALT 22  ALKPHOS 122  BILITOT 1.0  PROT 5.2*  ALBUMIN 2.0*   No results for input(s): LIPASE, AMYLASE in the last 168 hours. No results for input(s): AMMONIA in the last 168 hours.  CBC:  Recent Labs Lab 01/29/16 0904  WBC 10.5  NEUTROABS 9.1*  HGB 11.2*  10.2*  HCT 33.0*  31.8*  MCV 86.9  PLT 300    Cardiac Enzymes: No results for input(s): CKTOTAL, CKMB, CKMBINDEX, TROPONINI in the last 168 hours.  Lipid Panel: No results for input(s): CHOL, TRIG, HDL, CHOLHDL, VLDL, LDLCALC in the last 168 hours.  CBG:  Recent Labs Lab 01/30/16 1200 01/30/16 1307 01/30/16 1416 01/30/16 1655 01/30/16 2142  GLUCAP 105* 109* 125* 219* 220*    Microbiology: Results for orders placed or performed during the hospital encounter of 01/29/16  MRSA PCR Screening     Status: None   Collection Time: 01/29/16 10:58 PM  Result Value  Ref Range Status   MRSA by PCR NEGATIVE NEGATIVE Final    Comment:        The GeneXpert MRSA Assay (FDA approved for NASAL specimens only), is one component of a comprehensive MRSA colonization surveillance program. It is not intended to diagnose MRSA infection nor to guide or monitor treatment for MRSA infections.     Imaging: Ct Head Wo Contrast  01/29/2016  ADDENDUM REPORT: 01/29/2016 09:24 ADDENDUM: Critical Value/emergent results were called by telephone at the time of interpretation on 01/29/2016 at 9:20 am to Etta Quill, [place] , who verbally acknowledged these results. Electronically Signed   By: Lowella Grip III M.D.   On: 01/29/2016 09:24  01/29/2016  CLINICAL DATA:  Acute onset slurred speech and left-sided facial droop EXAM: CT HEAD WITHOUT CONTRAST TECHNIQUE: Contiguous axial images were obtained from the base of the skull through the vertex without intravenous contrast. COMPARISON:  None. FINDINGS: The ventricles are normal in size and configuration. There is no evidence of mass, hemorrhage, extra-axial fluid collection, or midline shift. Gray-white compartments appear normal. No acute infarct evident. Middle cerebral artery attenuation is symmetric bilaterally. Bony calvarium appears intact. The mastoid air cells are clear. No intraorbital lesions are apparent. IMPRESSION: Study within normal limits. In particular, no edema, hemorrhage, or evidence of acute infarct. Electronically Signed: By: Lowella Grip III M.D. On: 01/29/2016 09:12   Mr Brain Ltd W/o Cm  01/29/2016  CLINICAL DATA:  New onset of weakness and slurred speech noted at 6:30 a.m. today. Hyperglycemia. Symptoms compatible with left-sided Bell's palsy. EXAM: MRI HEAD WITHOUT CONTRAST TECHNIQUE: Multiplanar, multiecho pulse sequences of the brain and surrounding structures were obtained without intravenous contrast. COMPARISON:  Negative CT of the head without contrast from the same day. FINDINGS: Diffusion-weighted  images demonstrate no evidence for acute or subacute infarction. Axial T2 and gradient echo images were also performed. There is no significant white matter disease. No acute hemorrhage or mass lesion is present. The ventricles are of normal size. Insert pass fluid The internal auditory canals are within normal limits bilaterally. Flow is present in the major intracranial arteries. Bilateral lens replacements are noted. The globes and orbits otherwise intact. Mild mucosal thickening is present in the anterior ethmoid air cells. The remaining paranasal sinuses are clear. There is fluid in the mastoid air cells bilaterally, left greater than right. IMPRESSION: 1. No evidence for acute infarct. 2. No focal white matter disease to suggest press. 3. Left greater than right mastoid fluid. No obstructing nasopharyngeal lesion is present.  Electronically Signed   By: San Morelle M.D.   On: 01/29/2016 10:33    Assessment and plan:   Auzhane Slutsky is an 50 y.o. female patient who presented with severe hyperglycemia associated encephalopathy with multitude of neurological symptoms as described above. After her blood sugars up improved to less than 200 consistently this morning, all of her acute neurological symptoms have resolved, and she is at her baseline mental status, normal cranial nerve exam, normal motor strength. No further recommendations from neurology standpoint. Will sign off.

## 2016-01-30 NOTE — Progress Notes (Signed)
Attempted to call and get report.  No answer 2x.  Will return call again.

## 2016-01-30 NOTE — Evaluation (Signed)
Clinical/Bedside Swallow Evaluation Patient Details  Name: Alyssa Floyd MRN: [id number] Date of Birth: 1966/11/16  Today's Date: 01/30/2016 Time: SLP Start Time (ACUTE ONLY): 1155 SLP Stop Time (ACUTE ONLY): 1213 SLP Time Calculation (min) (ACUTE ONLY): 18 min  Past Medical History:  Past Medical History  Diagnosis Date  . Diabetes mellitus ([place])   . Hypertension   . Hyperlipidemia   . Nephrotic syndrome     diabetic nephropathy, biopsy on 01/06/16 at [facility]  . Subclinical hypothyroidism   . Vitamin D deficiency   . 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 Tobacco use   . Normocytic anemia   . Diabetic nephropathy [facility])    Past Surgical History:  Past Surgical History  Procedure Laterality Date  . Partial hysterectomy     HPI:  Ms. Alyssa Floyd is a 50 year old woman with PMH of DM1, HTN, HLD, and diabetic nephropathy who presents to the ED via EMS after having an episode of slurred speech and left facial droop at home. CT and MRI negative. Diagnosed with [name], Bells palsy vs [name] induced seizure to cause facial droop, oral thrush.    Assessment / Plan / Recommendation Clinical Impression  Patient presents with a functional oropharyngeal swallow without evidence of dysphagia or aspiration. No SLP f/u indicated at this time.     Aspiration Risk  No limitations    Diet Recommendation Regular;Thin liquid   Liquid Administration via: Cup;Straw Medication Administration: Whole meds with liquid Supervision: Patient able to self feed Compensations: Slow rate;Small sips/bites Postural Changes: Seated upright at 90 degrees    Other  Recommendations Oral Care Recommendations: Oral care BID   Follow up Recommendations  None               Swallow Study   General HPI: Ms. Alyssa Floyd is a 50 year old woman with PMH of DM1, HTN, HLD, and diabetic nephropathy who presents to the ED via EMS after having an episode of slurred speech and left facial droop at home. CT  and MRI negative. Diagnosed with [name], Bells palsy vs [name] induced seizure to cause facial droop, oral thrush.  Type of Study: Bedside Swallow Evaluation Previous Swallow Assessment: none noted Diet Prior to this Study: NPO Temperature Spikes Noted: No Respiratory Status: Room air History of Recent Intubation: No Behavior/Cognition: Alert;Cooperative;Pleasant mood Oral Cavity Assessment: Other (comment) (oral thrush) Oral Cavity - Dentition: Dentures, top;Dentures, bottom (partials) Vision: Functional for self-feeding Self-Feeding Abilities: Able to feed self Patient Positioning: Upright in bed Baseline Vocal Quality: Normal Volitional Cough: Strong Volitional Swallow: Able to elicit    Oral/Motor/Sensory Function Overall Oral Motor/Sensory Function: Within functional limits   Ice Chips Ice chips: Not tested   Thin Liquid Thin Liquid: Within functional limits Presentation: Cup;Self Fed;Straw    Nectar Thick Nectar Thick Liquid: Not tested   Honey Thick Honey Thick Liquid: Not tested   Puree Puree: Within functional limits Presentation: Spoon;Self Fed   Solid   GO  Alyssa Schliep MA, CCC-SLP [phone]  Solid: Within functional limits Presentation: [place] 01/30/2016,12:14 PM

## 2016-01-30 NOTE — ED Notes (Signed)
RN attempted to call report to floor; RN to call back  

## 2016-01-30 NOTE — Progress Notes (Signed)
Subjective: Alyssa Floyd mental status has dramatically improved. She denies pain. Still feels weak. During my interview she is tearful due to frustrations with controlling her diabetes.  On further questioning, the patient reports she did not feel a viral illness coming on before going to her cousins house. Rather, her cousin was concerned for the patient because she had recently been discharged from [facility] and was already going back to work, so requested that the patient live with her for a few days to keep an eye on her. The patient otherwise felt well. She regularly checks her blood sugar 3xday and had not noticed aberrant blood sugars in the days leading up to this admission.   Objective: Vital signs in last 24 hours: Filed Vitals:   01/30/16 0141 01/30/16 0503 01/30/16 0656 01/30/16 0800  BP: 145/71 169/78 154/77   Pulse: 71 70 75   Temp: 98 F (36.7 C) 98 F (36.7 C)  97.5 F (36.4 C)  TempSrc: Oral Oral  Axillary  Resp: 15 16 14    Height: 5\' 7"  (1.702 m)     Weight: 70.3 kg (154 lb 15.7 oz)     SpO2: 100% 100% 99%    Weight change:   Intake/Output Summary (Last 24 hours) at 01/30/16 0944 Last data filed at 01/30/16 0800  Gross per 24 hour  Intake 4123.42 ml  Output    200 ml  Net 3923.42 ml    Physical Exam General Appearance:    Alert, cooperative, no distress, appears stated age  Head:    Normocephalic, without obvious abnormality, atraumatic  Eyes:    conjunctiva/corneas clear, EOM's intact, PERRLA  Nose:   Nares normal,no rhinorrhe  Throat:   Lips and mucosa dry. Abundant oral thrush.  Back:     Symmetric, no curvature, ROM normal, no CVA tenderness  Lungs:     Clear to auscultation bilaterally, respirations unlabored  Chest Wall:    No tenderness or deformity   Heart:    Regular rate and rhythm, S1 and S2 normal, no murmur, rub   or gallop  Abdomen:     Soft, no masses, no organomegaly. Mild diffuse tenderness in all four quadrants.    Extremities:   3+ edema in the legs and hands. Extremities without cyanosis, rashes, or trauma.  Skin:  Warm and dry.  Neurologic:   Alert and oriented x3. Patient speaks slowly and occasionally spends >30 seconds thinking about the answer to a question, suggesting mild sluggishness. Otherwise normal. CNII-XII intact, normal strength, sensation throughout   Lab Results: Basic Metabolic Panel:  Recent Labs  01/29/16 1401  01/30/16 0214 01/30/16 0453  NA  --   < > 140 139  K  --   < > 3.2* 3.3*  CL  --   < > 102 100*  CO2  --   < > 30 26  GLUCOSE  --   < > 80 153*  BUN  --   < > 60* 58*  CREATININE  --   < > 1.94* 1.93*  CALCIUM  --   < > 8.0* 8.2*  MG 2.4  --   --   --   PHOS 5.7*  --   --   --   < > = values in this interval not displayed. Liver Function Tests:  Recent Labs  01/29/16 0904  AST 19  ALT 22  ALKPHOS 122  BILITOT 1.0  PROT 5.2*  ALBUMIN 2.0*   No results for input(s): LIPASE, AMYLASE  in the last 72 hours. No results for input(s): AMMONIA in the last 72 hours. CBC:  Recent Labs  01/29/16 0904  WBC 10.5  NEUTROABS 9.1*  HGB 11.2*  10.2*  HCT 33.0*  31.8*  MCV 86.9  PLT 300   Cardiac Enzymes: No results for input(s): CKTOTAL, CKMB, CKMBINDEX, TROPONINI in the last 72 hours. BNP: No results for input(s): PROBNP in the last 72 hours. D-Dimer: No results for input(s): DDIMER in the last 72 hours. CBG:  Recent Labs  01/30/16 0042 01/30/16 0146 01/30/16 0253 01/30/16 0354 01/30/16 0459 01/30/16 0555  GLUCAP 63* 78 90 143* 156* 244*   Hemoglobin A1C: No results for input(s): HGBA1C in the last 72 hours. Fasting Lipid Panel: No results for input(s): CHOL, HDL, LDLCALC, TRIG, CHOLHDL, LDLDIRECT in the last 72 hours. Thyroid Function Tests: No results for input(s): TSH, T4TOTAL, FREET4, T3FREE, THYROIDAB in the last 72 hours. Anemia Panel: No results for input(s): VITAMINB12, FOLATE, FERRITIN, TIBC, IRON, RETICCTPCT in the last 72  hours. Coagulation:  Recent Labs  01/29/16 0904  LABPROT 12.6  INR 0.92   Urine Drug Screen: Drugs of Abuse     Component Value Date/Time   LABOPIA NONE DETECTED 01/29/2016 2316   COCAINSCRNUR NONE DETECTED 01/29/2016 2316   LABBENZ NONE DETECTED 01/29/2016 2316   AMPHETMU NONE DETECTED 01/29/2016 2316   THCU NONE DETECTED 01/29/2016 2316   LABBARB NONE DETECTED 01/29/2016 2316    Alcohol Level:  Recent Labs  01/29/16 0904  ETH <5   Urinalysis:  Recent Labs  01/29/16 2317  COLORURINE YELLOW  LABSPEC 1.021  PHURINE 5.0  GLUCOSEU >1000*  HGBUR SMALL*  BILIRUBINUR SMALL*  KETONESUR NEGATIVE  PROTEINUR 100*  NITRITE NEGATIVE  LEUKOCYTESUR NEGATIVE    Micro Results: Recent Results (from the past 240 hour(s))  MRSA PCR Screening     Status: None   Collection Time: 01/29/16 10:58 PM  Result Value Ref Range Status   MRSA by PCR NEGATIVE NEGATIVE Final    Comment:        The GeneXpert MRSA Assay (FDA approved for NASAL specimens only), is one component of a comprehensive MRSA colonization surveillance program. It is not intended to diagnose MRSA infection nor to guide or monitor treatment for MRSA infections.    Studies/Results: Ct Head Wo Contrast  01/29/2016  ADDENDUM REPORT: 01/29/2016 09:24 ADDENDUM: Critical Value/emergent results were called by telephone at the time of interpretation on 01/29/2016 at 9:20 am to Etta Quill, [place] , who verbally acknowledged these results. Electronically Signed   By: Lowella Grip III M.D.   On: 01/29/2016 09:24  01/29/2016  CLINICAL DATA:  Acute onset slurred speech and left-sided facial droop EXAM: CT HEAD WITHOUT CONTRAST TECHNIQUE: Contiguous axial images were obtained from the base of the skull through the vertex without intravenous contrast. COMPARISON:  None. FINDINGS: The ventricles are normal in size and configuration. There is no evidence of mass, hemorrhage, extra-axial fluid collection, or midline shift.  Gray-white compartments appear normal. No acute infarct evident. Middle cerebral artery attenuation is symmetric bilaterally. Bony calvarium appears intact. The mastoid air cells are clear. No intraorbital lesions are apparent. IMPRESSION: Study within normal limits. In particular, no edema, hemorrhage, or evidence of acute infarct. Electronically Signed: By: Lowella Grip III M.D. On: 01/29/2016 09:12   Mr Brain Ltd W/o Cm  01/29/2016  CLINICAL DATA:  New onset of weakness and slurred speech noted at 6:30 a.m. today. Hyperglycemia. Symptoms compatible with left-sided Bell's palsy. EXAM: MRI  HEAD WITHOUT CONTRAST TECHNIQUE: Multiplanar, multiecho pulse sequences of the brain and surrounding structures were obtained without intravenous contrast. COMPARISON:  Negative CT of the head without contrast from the same day. FINDINGS: Diffusion-weighted images demonstrate no evidence for acute or subacute infarction. Axial T2 and gradient echo images were also performed. There is no significant white matter disease. No acute hemorrhage or mass lesion is present. The ventricles are of normal size. Insert pass fluid The internal auditory canals are within normal limits bilaterally. Flow is present in the major intracranial arteries. Bilateral lens replacements are noted. The globes and orbits otherwise intact. Mild mucosal thickening is present in the anterior ethmoid air cells. The remaining paranasal sinuses are clear. There is fluid in the mastoid air cells bilaterally, left greater than right. IMPRESSION: 1. No evidence for acute infarct. 2. No focal white matter disease to suggest press. 3. Left greater than right mastoid fluid. No obstructing nasopharyngeal lesion is present. Electronically Signed   By: San Morelle M.D.   On: 01/29/2016 10:33   Medications:  . sodium chloride   Intravenous STAT  . antiseptic oral rinse  7 mL Mouth Rinse q12n4p  . chlorhexidine  15 mL Mouth Rinse BID  . enoxaparin  (LOVENOX) injection  30 mg Subcutaneous Q24H  . nystatin  5 mL Oral QID  . sodium chloride flush  3 mL Intravenous Q12H   acetaminophen **OR** acetaminophen, promethazine  Assessment/Plan: Principal Problem:   [name], type 1 (HCC) Active Problems:   Acute renal failure (ARF) (HCC)   Prolonged Q-T interval on ECG   Hyperlipidemia   Hypertension   Chronic diarrhea   Nephrotic syndrome   Tobacco use   Facial paralysis   Normocytic anemia   Subclinical hypothyroidism  [name] vs HHS in setting of DM1 Patient initially diagnosed with DM2, but recent hospitalization at [facility] for [name] revealed +GAD antibodies and low C-peptide suggesting DM1. Patient has been on insulin since shortly after diagnosis 13 years ago. This admission, pt presented with blood sugar 1361, serum osmolality 325, severe AMS, and no ketonuria, all favoring a diagnosis of HHS. Ms. Ludwigsen takes her medications every day but her diabetes has become increasingly difficult to control, leading to significant frustration for her. Her recent hospitalization at [facility] was her first hospitalization related to her diabestes. - Continue insulin infusion, titrate per protocol - Continue D5W-1/2NS @75  ml/hr - Continue BMET q4h until midnight tonight - Hypokalemia: K+ 3.3 at 5 AM, finished course of 40 mEq at 7:30 AM. Next BMP results due soon; after which replete as necessary for goal K>4  AKI in setting of Diabetic Nephropathy Baseline Cr ~1.1. At presentation Cr ~3 and BUN/Cr=5 indicating prerenal. At home takes torsemide 50 mg BID for lower extremity swelling. - Cr trending down; Cr 1.9 today; Continue IVF as above - Hold torsemide while giving fluids for acute illness  Bells Palsy, resolved Patient p/w left facial droop and dysarthria. Also transiently had nystagmus and disconjugate gaze. Neuro consulted, CT neg, MRI neg, EEG normal. Likely viral etiology.   Oral Thrush Identified on admission and persists today. Patient does not take  home med for this. - HIV non-reactive - Continue nystatin po 4xday  History of hypertension Takes home lisinopril 80 mg qd. Chart review suggests patient tends to run 170/80. BP has been  <170/80 this admission. - Hold home lisinopril for now, restart if BP >190/80  History of hyperlipidemia - Hold home statin while in hospital  History of chronic diarrhea -  Hold home pancrealipase and imodium while acutely ill  Subclinical Hashimoto's thyroiditis TSH elevated on prior clinic note. This admission, anti TPO antibody+. No home thyroid med due to subclinical. - Note to PCP at discharge  History of tobacco use  Smokes 6 cigs daily.  - Pt counseled on cessation  DVT prophylaxis - Continue lovenox 30mg  qd  This is a Careers information officer Note.  The care of the patient was discussed with Dr. Hulen Luster and the assessment and plan formulated with their assistance.  Please see their attached note for official documentation of the daily encounter.   LOS: 1 day   Claudius Sis, Med Student 01/30/2016, 9:44 AM

## 2016-01-30 NOTE — Progress Notes (Signed)
Pt arrived to unit lethargic but arousable.  Per ED RN, pt blood sugar 63 and was given Dextrose.  Insulin gtt shut off.  Pt current CBG 78.  Will continue to monitor.

## 2016-01-31 DIAGNOSIS — E063 Autoimmune thyroiditis: Secondary | ICD-10-CM

## 2016-01-31 LAB — BASIC METABOLIC PANEL
ANION GAP: 8 (ref 5–15)
ANION GAP: 9 (ref 5–15)
Anion gap: 7 (ref 5–15)
BUN: 35 mg/dL — AB (ref 6–20)
BUN: 36 mg/dL — ABNORMAL HIGH (ref 6–20)
BUN: 38 mg/dL — ABNORMAL HIGH (ref 6–20)
CALCIUM: 7.9 mg/dL — AB (ref 8.9–10.3)
CALCIUM: 7.7 mg/dL — AB (ref 8.9–10.3)
CHLORIDE: 99 mmol/L — AB (ref 101–111)
CO2: 26 mmol/L (ref 22–32)
CO2: 26 mmol/L (ref 22–32)
CO2: 24 mmol/L (ref 22–32)
CREATININE: 1.59 mg/dL — AB (ref 0.44–1.00)
CREATININE: 1.65 mg/dL — AB (ref 0.44–1.00)
Calcium: 7.7 mg/dL — ABNORMAL LOW (ref 8.9–10.3)
Chloride: 101 mmol/L (ref 101–111)
Chloride: 103 mmol/L (ref 101–111)
Creatinine, Ser: 1.59 mg/dL — ABNORMAL HIGH (ref 0.44–1.00)
GFR, EST AFRICAN AMERICAN: 43 mL/min — AB (ref >60)
GFR, EST AFRICAN AMERICAN: 43 mL/min — AB (ref >60)
GFR, EST AFRICAN AMERICAN: 41 mL/min — AB (ref >60)
GFR, EST NON AFRICAN AMERICAN: 37 mL/min — AB (ref >60)
GFR, EST NON AFRICAN AMERICAN: 37 mL/min — AB (ref >60)
GFR, EST NON AFRICAN AMERICAN: 36 mL/min — AB (ref >60)
GLUCOSE: 305 mg/dL — AB (ref 65–99)
GLUCOSE: 387 mg/dL — AB (ref 65–99)
Glucose, Bld: 487 mg/dL — ABNORMAL HIGH (ref 65–99)
POTASSIUM: 4.2 mmol/L (ref 3.5–5.1)
Potassium: 4 mmol/L (ref 3.5–5.1)
Potassium: 4.8 mmol/L (ref 3.5–5.1)
SODIUM: 133 mmol/L — AB (ref 135–145)
SODIUM: 136 mmol/L (ref 135–145)
Sodium: 134 mmol/L — ABNORMAL LOW (ref 135–145)

## 2016-01-31 LAB — GLUCOSE, CAPILLARY
GLUCOSE-CAPILLARY: 421 mg/dL — AB (ref 65–99)
GLUCOSE-CAPILLARY: 341 mg/dL — AB (ref 65–99)
GLUCOSE-CAPILLARY: 122 mg/dL — AB (ref 65–99)
Glucose-Capillary: 82 mg/dL (ref 65–99)

## 2016-01-31 MED ORDER — INSULIN ASPART 100 UNIT/ML ~~LOC~~ SOLN
5.0000 [IU] | Freq: Three times a day (TID) | SUBCUTANEOUS | Status: DC
  Administered 2016-01-31 – 2016-02-01 (×3): 5 [IU] via SUBCUTANEOUS

## 2016-01-31 MED ORDER — FERROUS SULFATE 325 (65 FE) MG PO TABS
325.0000 mg | ORAL_TABLET | Freq: Every day | ORAL | Status: DC
  Administered 2016-01-31 – 2016-02-01 (×2): 325 mg via ORAL
  Filled 2016-01-31 (×2): qty 1

## 2016-01-31 MED ORDER — ENOXAPARIN SODIUM 40 MG/0.4ML ~~LOC~~ SOLN
40.0000 mg | SUBCUTANEOUS | Status: DC
  Administered 2016-01-31: 40 mg via SUBCUTANEOUS
  Filled 2016-01-31: qty 0.4

## 2016-01-31 MED ORDER — INSULIN ASPART 100 UNIT/ML ~~LOC~~ SOLN
0.0000 [IU] | Freq: Three times a day (TID) | SUBCUTANEOUS | Status: DC
  Administered 2016-01-31: 15 [IU] via SUBCUTANEOUS
  Administered 2016-01-31: 11 [IU] via SUBCUTANEOUS
  Administered 2016-02-01: 15 [IU] via SUBCUTANEOUS

## 2016-01-31 MED ORDER — INSULIN ASPART 100 UNIT/ML ~~LOC~~ SOLN: 5.0000 [IU] | Freq: Three times a day (TID) | SUBCUTANEOUS | Status: DC

## 2016-01-31 MED ORDER — INSULIN GLARGINE 100 UNIT/ML ~~LOC~~ SOLN: 15.0000 [IU] | Freq: Every day | SUBCUTANEOUS | Status: DC

## 2016-01-31 MED ORDER — INSULIN ASPART 100 UNIT/ML ~~LOC~~ SOLN
5.0000 [IU] | Freq: Once | SUBCUTANEOUS | Status: AC
  Administered 2016-01-31: 5 [IU] via SUBCUTANEOUS

## 2016-01-31 NOTE — Discharge Instructions (Signed)
Take Lantus 15 units daily.   Take novolog 5 units with meals.   Call your PCP tomorrow morning to schedule a hospital follow up appointment.

## 2016-01-31 NOTE — Discharge Summary (Signed)
Name: Alyssa Floyd MRN: [id number] DOB: [birth year] 50 y.o. PCP: Steele Sizer, MD  Date of Admission: 01/29/2016  8:52 AM Date of Discharge: 02/01/2016 Attending Physician: Sid Falcon, MD  Discharge Diagnosis: Principal Problem:   [name], type 1 ([place]) Active Problems:   Acute renal failure (ARF) (HCC)   Prolonged Q-T interval on ECG   Hyperlipidemia   Hypertension   Chronic diarrhea   Nephrotic syndrome   Tobacco use   Facial paralysis   Normocytic anemia   Subclinical hypothyroidism   HHNC (hyperglycemic hyperosmolar nonketotic coma) ([place])   Acute encephalopathy  Discharge Medications:   Medication List    TAKE these medications        atorvastatin 40 MG tablet  Commonly known as:  LIPITOR  Take 20 mg by mouth daily.     ferrous sulfate 325 (65 FE) MG tablet  Take 325 mg by mouth daily with breakfast.     insulin aspart 100 UNIT/ML injection  Commonly known as:  novoLOG  Inject 5 Units into the skin 3 (three) times daily with meals.     insulin glargine 100 UNIT/ML injection  Commonly known as:  LANTUS  Inject 0.15 mLs (15 Units total) into the skin daily.     lisinopril 40 MG tablet  Commonly known as:  PRINIVIL,ZESTRIL  Take 80 mg by mouth daily.     magnesium oxide 400 MG tablet  Commonly known as:  MAG-OX  Take 400 mg by mouth 2 (two) times daily.     multivitamin with minerals Tabs tablet  Take 1 tablet by mouth daily.     potassium chloride SA 20 MEQ tablet  Commonly known as:  K-DUR,KLOR-CON  Take 20 mEq by mouth daily.     Protein Powd  25 g by Does not apply route 2 (two) times daily.     torsemide 100 MG tablet  Commonly known as:  DEMADEX  Take 50 mg by mouth 2 (two) times daily.     Vitamin D (Ergocalciferol) 50000 units Caps capsule  Commonly known as:  DRISDOL  Take 50,000 Units by mouth once a week.     ZENPEP 5000 units Cpep  Generic drug:  Pancrelipase (Lip-Prot-Amyl)  Take 10,000 Units by mouth 3 (three) times daily with  meals.        Disposition and follow-up:   Ms.Alyssa Floyd was discharged from [facility] in Good condition.  At the hospital follow up visit please address:  1.  Please adjust patients insulin regimen as necessary.  2.  Labs / imaging needed at time of follow-up: TSH checked annually  3.  Pending labs/ test needing follow-up: none  Follow-up Appointments:     Follow-up Information    Schedule an appointment as soon as possible for a visit with Loistine Chance, MD.   Specialty:  Family Medicine   Why:  Make a hospital follow up appointment   Contact information:   [address] [place] [address] [phone]      Procedures Performed:  Ct Head Wo Contrast  01/29/2016  ADDENDUM REPORT: 01/29/2016 09:24 ADDENDUM: Critical Value/emergent results were called by telephone at the time of interpretation on 01/29/2016 at 9:20 am to Etta Quill, [place] , who verbally acknowledged these results. Electronically Signed   By: Lowella Grip III M.D.   On: 01/29/2016 09:24  01/29/2016  CLINICAL DATA:  Acute onset slurred speech and left-sided facial droop EXAM: CT HEAD WITHOUT CONTRAST TECHNIQUE: Contiguous axial  images were obtained from the base of the skull through the vertex without intravenous contrast. COMPARISON:  None. FINDINGS: The ventricles are normal in size and configuration. There is no evidence of mass, hemorrhage, extra-axial fluid collection, or midline shift. Gray-white compartments appear normal. No acute infarct evident. Middle cerebral artery attenuation is symmetric bilaterally. Bony calvarium appears intact. The mastoid air cells are clear. No intraorbital lesions are apparent. IMPRESSION: Study within normal limits. In particular, no edema, hemorrhage, or evidence of acute infarct. Electronically Signed: By: Lowella Grip III M.D. On: 01/29/2016 09:12   Mr Brain Ltd W/o Cm  01/29/2016  CLINICAL DATA:  New onset of weakness and slurred  speech noted at 6:30 a.m. today. Hyperglycemia. Symptoms compatible with left-sided Bell's palsy. EXAM: MRI HEAD WITHOUT CONTRAST TECHNIQUE: Multiplanar, multiecho pulse sequences of the brain and surrounding structures were obtained without intravenous contrast. COMPARISON:  Negative CT of the head without contrast from the same day. FINDINGS: Diffusion-weighted images demonstrate no evidence for acute or subacute infarction. Axial T2 and gradient echo images were also performed. There is no significant white matter disease. No acute hemorrhage or mass lesion is present. The ventricles are of normal size. Insert pass fluid The internal auditory canals are within normal limits bilaterally. Flow is present in the major intracranial arteries. Bilateral lens replacements are noted. The globes and orbits otherwise intact. Mild mucosal thickening is present in the anterior ethmoid air cells. The remaining paranasal sinuses are clear. There is fluid in the mastoid air cells bilaterally, left greater than right. IMPRESSION: 1. No evidence for acute infarct. 2. No focal white matter disease to suggest press. 3. Left greater than right mastoid fluid. No obstructing nasopharyngeal lesion is present. Electronically Signed   By: San Morelle M.D.   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  History limited due to patient's dysarthria.   Per chart review she was last seen normal at 3:30 this morning. Per mother she has been feeling weak with unsteady gait and had to use a cane to ambulate to the restroom. She was too weak to arise from the toliet. She was found to have dysarthria (6:30 AM) and facial droop on EMS  arrival. Code stroke was activated on ED arrival. CT head was normal. MRI brain did not show evidence of CVA or white matter disease to suggest PRESS. She denies focal weakness, paraesthesias, dysphagia, changes in vision, headache, dizziness, inability to close eyes, ear pain, drooling, shaking, or history of similar symptoms in the past. She denies recent illness, history of CVA, history of seizures, or Bell's Palsy. She is able to rise her eyebrows.   She has Type 1 diabetes mellitus (dx 13 yrs ago) that is managed at [facility] with last visit on 01/26/16. Her A1c was 11.4 on 12/18/15. She reports compliance with taking Lantus 14 U daily (decreased from 16 U daily on 01/26/16) and Humalog 6 U TID with meals. Her blood sugars at home are usually low. Her blood sugar was 1361 on arrival. She was recently hospitalized at [facility] from 1/30 - 2/10 for [name]. She reports polydipsia and polyuria. She reports compliance with taking torsemide 50 mg BID for chronic LE edema in setting of nephrotic syndrome from diabetic nephropathy.  Hospital Course by problem list: Principal Problem:   [name], type 1 ([place]) Active Problems:   Acute renal failure (ARF) (HCC)   Prolonged Q-T interval on ECG   Hyperlipidemia   Hypertension   Chronic diarrhea   Nephrotic syndrome   Tobacco use   Facial paralysis   Normocytic anemia   Subclinical hypothyroidism   HHNC (hyperglycemic hyperosmolar nonketotic coma) ([place])   Acute encephalopathy   Hyperosmolar Hyperglycemic State (HHS) The patient presented with severe acute altered mental status, left facial droop, and dysarthria. There was initial concern for stroke but CT and MRI of head were negative. Her blood sugar was found to be >1300. The patient was started on IV fluids and an insulin infusion. A urine analysis showed no ketones in the urine, and plasma osmolarity was found to be >320. Betahydroxybutyrate was negative.  Due to these lab results, the patient was felt to have HHS  despite carrying a diagnosis of type 1 diabetes (+GAD antibodies, low C peptide) diagnosed 13 years ago. A careful history and chart review revealed that the patient had been having difficulty with hypoglycemic episodes several weeks prior to admission, for which her nightly dose of lantus was decreased, thus potentially contributing to this acute event. She also mentions that she had a sick niece who she has been in contact with thus also a viral etiology could have been a precipitating event. As hyperglycemia resolved, she became increasingly more alert and oriented. Her facial droop resolved. She was able to answer questions and follow commands appropriately. She was discharged on lantus 15units daily and novolog 5  Units with meals.   Bells Palsy, resolved The patient presented with dysarthria and left facial droop. She had never experienced these symptoms before. Due to initial concern for stroke, she was seen by neurology. A CT head and MRI head were both negative. An EEG was performed and was negative for seizure activity. The etiology of the patient's facial droop and altered mental status was likely metabolic encephalopathy.   AKI in setting of diabetic nephropathy The patient presented with lower extremity edema and has a history of diabetic nephropathy, for which she takes torsemide at home. During her hospitalization, the patient was felt to be dehydrated due to HHS. Her home torsemide was held. After resolution of HHS, her home medications were resumed.    Discharge Vitals:   BP 135/79 mmHg  Pulse 81  Temp(Src) 98.2 F (36.8 C) (Oral)  Resp 18  Ht 5\' 7"  (1.702 m)  Wt 159 lb 9.8 oz (72.4 kg)  BMI 24.99 kg/m2  SpO2 99%  Discharge Labs:  Results for orders placed or performed during the hospital encounter of 01/29/16 (from the past 24 hour(s))  Basic metabolic panel     Status: Abnormal   Collection Time: 01/30/16  4:47 PM  Result Value Ref Range   Sodium 138 135 - 145 mmol/L    Potassium 3.8 3.5 - 5.1 mmol/L   Chloride 102 101 - 111 mmol/L   CO2 28 22 - 32 mmol/L   Glucose, Bld 216 (H) 65 - 99 mg/dL   BUN 44 (H) 6 - 20 mg/dL   Creatinine, Ser 1.69 (H) 0.44 - 1.00 mg/dL   Calcium 7.7 (L) 8.9 - 10.3 mg/dL   GFR calc non Af Amer 34 (L) >60 mL/min   GFR calc Af Amer 40 (L) >60 mL/min   Anion gap 8 5 - 15  Glucose, capillary     Status: Abnormal   Collection  Time: 01/30/16  4:55 PM  Result Value Ref Range   Glucose-Capillary 219 (H) 65 - 99 mg/dL   Comment 1 Notify RN   Basic metabolic panel     Status: Abnormal   Collection Time: 01/30/16  8:10 PM  Result Value Ref Range   Sodium 136 135 - 145 mmol/L   Potassium 3.8 3.5 - 5.1 mmol/L   Chloride 100 (L) 101 - 111 mmol/L   CO2 25 22 - 32 mmol/L   Glucose, Bld 190 (H) 65 - 99 mg/dL   BUN 44 (H) 6 - 20 mg/dL   Creatinine, Ser 1.66 (H) 0.44 - 1.00 mg/dL   Calcium 7.9 (L) 8.9 - 10.3 mg/dL   GFR calc non Af Amer 35 (L) >60 mL/min   GFR calc Af Amer 41 (L) >60 mL/min   Anion gap 11 5 - 15  Glucose, capillary     Status: Abnormal   Collection Time: 01/30/16  9:42 PM  Result Value Ref Range   Glucose-Capillary 220 (H) 65 - 99 mg/dL   Comment 1 Notify RN   Basic metabolic panel     Status: Abnormal   Collection Time: 01/30/16 11:53 PM  Result Value Ref Range   Sodium 136 135 - 145 mmol/L   Potassium 4.0 3.5 - 5.1 mmol/L   Chloride 101 101 - 111 mmol/L   CO2 26 22 - 32 mmol/L   Glucose, Bld 305 (H) 65 - 99 mg/dL   BUN 36 (H) 6 - 20 mg/dL   Creatinine, Ser 1.59 (H) 0.44 - 1.00 mg/dL   Calcium 7.9 (L) 8.9 - 10.3 mg/dL   GFR calc non Af Amer 37 (L) >60 mL/min   GFR calc Af Amer 43 (L) >60 mL/min   Anion gap 9 5 - 15  Basic metabolic panel     Status: Abnormal   Collection Time: 01/31/16  3:03 AM  Result Value Ref Range   Sodium 134 (L) 135 - 145 mmol/L   Potassium 4.8 3.5 - 5.1 mmol/L   Chloride 103 101 - 111 mmol/L   CO2 24 22 - 32 mmol/L   Glucose, Bld 387 (H) 65 - 99 mg/dL   BUN 38 (H) 6 - 20 mg/dL    Creatinine, Ser 1.65 (H) 0.44 - 1.00 mg/dL   Calcium 7.7 (L) 8.9 - 10.3 mg/dL   GFR calc non Af Amer 36 (L) >60 mL/min   GFR calc Af Amer 41 (L) >60 mL/min   Anion gap 7 5 - 15  Glucose, capillary     Status: Abnormal   Collection Time: 01/31/16  8:13 AM  Result Value Ref Range   Glucose-Capillary 421 (H) 65 - 99 mg/dL  Basic metabolic panel     Status: Abnormal   Collection Time: 01/31/16  9:20 AM  Result Value Ref Range   Sodium 133 (L) 135 - 145 mmol/L   Potassium 4.2 3.5 - 5.1 mmol/L   Chloride 99 (L) 101 - 111 mmol/L   CO2 26 22 - 32 mmol/L   Glucose, Bld 487 (H) 65 - 99 mg/dL   BUN 35 (H) 6 - 20 mg/dL   Creatinine, Ser 1.59 (H) 0.44 - 1.00 mg/dL   Calcium 7.7 (L) 8.9 - 10.3 mg/dL   GFR calc non Af Amer 37 (L) >60 mL/min   GFR calc Af Amer 43 (L) >60 mL/min   Anion gap 8 5 - 15  Glucose, capillary     Status: Abnormal   Collection Time:  01/31/16 12:09 PM  Result Value Ref Range   Glucose-Capillary 341 (H) 65 - 99 mg/dL    Signed: Norman Herrlich, MD 01/31/2016, 3:54 PM    Services Ordered on Discharge: [place] on Discharge: walker

## 2016-01-31 NOTE — Progress Notes (Signed)
Patient medically stable for discharge today. Discharge orders and walker placed at 3:50 pm. However, home health PT contact information was not able to be given to patient and a walker was not able to be provided for patient. Thus the patient will stay tonight. Will place transfer to med surg order now.    Julious Oka, MD Internal Medicine Resident, PGY [facility] Pager: [phone]

## 2016-01-31 NOTE — Progress Notes (Signed)
Subjective: Denies n/v and abd pain. No overnight events.   Objective: Vital signs in last 24 hours: Filed Vitals:   01/30/16 1926 01/30/16 2000 01/31/16 0004 01/31/16 0359  BP: 146/88 143/86 146/76 141/80  Pulse: 83 87 85 83  Temp: 98.1 F (36.7 C)  98.1 F (36.7 C) 98.2 F (36.8 C)  TempSrc: Oral  Oral Oral  Resp: 20 22 18 18   Height:      Weight:    159 lb 9.8 oz (72.4 kg)  SpO2: 99% 97% 97% 96%   Weight change: 4 lb 10.1 oz (2.1 kg)  Intake/Output Summary (Last 24 hours) at 01/31/16 [id number] Last data filed at 01/31/16 0132  Gross per 24 hour  Intake 1174.51 ml  Output   1279 ml  Net -104.49 ml   Physical Exam  Constitutional: She is oriented to person, place, and time. She appears well-developed and well-nourished. No distress.  HENT:  Head: Normocephalic and atraumatic.  Eyes: Conjunctivae and EOM are normal. Right eye exhibits no discharge. Left eye exhibits no discharge. No scleral icterus.  Cardiovascular: Normal rate, regular rhythm and normal heart sounds.  Exam reveals no gallop and no friction rub.   No murmur heard. Pulmonary/Chest: Effort normal and breath sounds normal. No respiratory distress. She has no wheezes. She has no rales.  Abdominal: Soft. Bowel sounds are normal. She exhibits no distension and no mass. There is no tenderness. There is no rebound and no guarding.  Musculoskeletal: She exhibits edema (2+ pedal edema b/l).  Neurological: She is alert and oriented to person, place, and time.  Skin: Skin is warm and dry. She is not diaphoretic.   Lab Results: Basic Metabolic Panel:  Recent Labs Lab 01/29/16 1401  01/30/16 2353 01/31/16 0303  NA  --   < > 136 134*  K  --   < > 4.0 4.8  CL  --   < > 101 103  CO2  --   < > 26 24  GLUCOSE  --   < > 305* 387*  BUN  --   < > 36* 38*  CREATININE  --   < > 1.59* 1.65*  CALCIUM  --   < > 7.9* 7.7*  MG 2.4  --   --   --   PHOS 5.7*  --   --   --   < > = values in this interval not  displayed. Liver Function Tests:  Recent Labs Lab 01/29/16 0904  AST 19  ALT 22  ALKPHOS 122  BILITOT 1.0  PROT 5.2*  ALBUMIN 2.0*   CBC:  Recent Labs Lab 01/29/16 0904  WBC 10.5  NEUTROABS 9.1*  HGB 11.2*  10.2*  HCT 33.0*  31.8*  MCV 86.9  PLT 300   CBG:  Recent Labs Lab 01/30/16 1054 01/30/16 1200 01/30/16 1307 01/30/16 1416 01/30/16 1655 01/30/16 2142  GLUCAP 109* 105* 109* 125* 219* 220*   Coagulation:  Recent Labs Lab 01/29/16 0904  LABPROT 12.6  INR 0.92    Urinalysis:  Recent Labs Lab 01/29/16 2317  COLORURINE YELLOW  LABSPEC 1.021  PHURINE 5.0  GLUCOSEU >1000*  HGBUR SMALL*  BILIRUBINUR SMALL*  KETONESUR NEGATIVE  PROTEINUR 100*  NITRITE NEGATIVE  LEUKOCYTESUR NEGATIVE   Micro Results: Recent Results (from the past 240 hour(s))  MRSA PCR Screening     Status: None   Collection Time: 01/29/16 10:58 PM  Result Value Ref Range Status   MRSA by PCR NEGATIVE NEGATIVE Final  Comment:        The GeneXpert MRSA Assay (FDA approved for NASAL specimens only), is one component of a comprehensive MRSA colonization surveillance program. It is not intended to diagnose MRSA infection nor to guide or monitor treatment for MRSA infections.    Studies/Results: Ct Head Wo Contrast  01/29/2016  ADDENDUM REPORT: 01/29/2016 09:24 ADDENDUM: Critical Value/emergent results were called by telephone at the time of interpretation on 01/29/2016 at 9:20 am to Etta Quill, [place] , who verbally acknowledged these results. Electronically Signed   By: Lowella Grip III M.D.   On: 01/29/2016 09:24  01/29/2016  CLINICAL DATA:  Acute onset slurred speech and left-sided facial droop EXAM: CT HEAD WITHOUT CONTRAST TECHNIQUE: Contiguous axial images were obtained from the base of the skull through the vertex without intravenous contrast. COMPARISON:  None. FINDINGS: The ventricles are normal in size and configuration. There is no evidence of mass, hemorrhage,  extra-axial fluid collection, or midline shift. Gray-white compartments appear normal. No acute infarct evident. Middle cerebral artery attenuation is symmetric bilaterally. Bony calvarium appears intact. The mastoid air cells are clear. No intraorbital lesions are apparent. IMPRESSION: Study within normal limits. In particular, no edema, hemorrhage, or evidence of acute infarct. Electronically Signed: By: Lowella Grip III M.D. On: 01/29/2016 09:12   Mr Brain Ltd W/o Cm  01/29/2016  CLINICAL DATA:  New onset of weakness and slurred speech noted at 6:30 a.m. today. Hyperglycemia. Symptoms compatible with left-sided Bell's palsy. EXAM: MRI HEAD WITHOUT CONTRAST TECHNIQUE: Multiplanar, multiecho pulse sequences of the brain and surrounding structures were obtained without intravenous contrast. COMPARISON:  Negative CT of the head without contrast from the same day. FINDINGS: Diffusion-weighted images demonstrate no evidence for acute or subacute infarction. Axial T2 and gradient echo images were also performed. There is no significant white matter disease. No acute hemorrhage or mass lesion is present. The ventricles are of normal size. Insert pass fluid The internal auditory canals are within normal limits bilaterally. Flow is present in the major intracranial arteries. Bilateral lens replacements are noted. The globes and orbits otherwise intact. Mild mucosal thickening is present in the anterior ethmoid air cells. The remaining paranasal sinuses are clear. There is fluid in the mastoid air cells bilaterally, left greater than right. IMPRESSION: 1. No evidence for acute infarct. 2. No focal white matter disease to suggest press. 3. Left greater than right mastoid fluid. No obstructing nasopharyngeal lesion is present. Electronically Signed   By: San Morelle M.D.   On: 01/29/2016 10:33   Medications: I have reviewed the patient's current medications. Scheduled Meds: . antiseptic oral rinse  7 mL  Mouth Rinse q12n4p  . chlorhexidine  15 mL Mouth Rinse BID  . enoxaparin (LOVENOX) injection  30 mg Subcutaneous Q24H  . insulin aspart  0-15 Units Subcutaneous TID WC  . insulin glargine  10 Units Subcutaneous Daily  . nystatin  5 mL Oral QID  . sodium chloride flush  3 mL Intravenous Q12H   Continuous Infusions: . insulin (NOVOLIN-R) infusion Stopped (01/30/16 1700)   PRN Meds:.acetaminophen **OR** acetaminophen, loperamide, promethazine Assessment/Plan: Principal Problem:   [name], type 1 (HCC) Active Problems:   Acute renal failure (ARF) (HCC)   Prolonged Q-T interval on ECG   Hyperlipidemia   Hypertension   Chronic diarrhea   Nephrotic syndrome   Tobacco use   Facial paralysis   Normocytic anemia   Subclinical hypothyroidism   HHNC (hyperglycemic hyperosmolar nonketotic coma) ([place])   Acute encephalopathy  HHS--anion  gap closed, carb mod diet started, and lantus 10 units qd given. Her glucose this am is elevated at 387. Home insulin regimen is 14 units of lantus qd and novolog 6 units w/ meals. Received 10 units of lantus and 3 units of novolog yesterday.  - SSI- sensitive changed to moderate - consulted inpatient diabetic coordinator  Facial paralysis and dysarthria-- resolved, likely from metabolic encephalopathy in setting of severe hyperglycemia. MRI negative. 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 - nystatin wash QID until resolves  - HIV negative  Hypertension  -Hold home lisinopril 80 mg daily and torsemide 50 mg BID  Subclinical Hypothyroidism - Last TSH 8.43  with normal free T4 on 01/03/16. Pt not on replacement therapy.  -anti-TPO elevated, will need TSH checked annually.    Chronic normocytic anemia - Hg 10.2 at baseline with no active bleeding. Last anemia panel with ferritin 182, TIBC low at 170, iron low at 14, and Tstat low at 8%. Pt is s/p hysterectomy.  -resumed home ferrous fumarate 324 mg daily    DVT PPx: Lovenox  Code: Full   Dispo: Anticipated discharge in approximately 0-1 day(s).   The patient does have a current PCP Steele Sizer, MD) and does not need an [facility] hospital follow-up appointment after discharge.  The patient does not have transportation limitations that hinder transportation to clinic appointments.  .Services Needed at time of discharge: Y = Yes, Blank = No PT:   OT:   RN:   Equipment:   Other:     LOS: 2 days   Norman Herrlich, MD 01/31/2016, 6:43 AM

## 2016-01-31 NOTE — Progress Notes (Addendum)
Inpatient Diabetes Program Recommendations  AACE/ADA: New Consensus Statement on Inpatient Glycemic Control (2015)  Target Ranges:  Prepandial:   less than 140 mg/dL      Peak postprandial:   less than 180 mg/dL (1-2 hours)      Critically ill patients:  140 - 180 mg/dL  Results for Alyssa Floyd, Alyssa Floyd (MRN [id number]) as of 01/31/2016 09:03  Ref. Range 01/30/2016 13:07 01/30/2016 14:16 01/30/2016 16:55 01/30/2016 21:42 01/31/2016 08:13  Glucose-Capillary Latest Ref Range: 65-99 mg/dL 109 (H) 125 (H) 219 (H) 220 (H) 421 (H)   Review of Glycemic Control  Diabetes history: Type 1 diabetes Outpatient Diabetes medications: Lantus 14 units daily, Humalog 6 units tid with meals Current orders for Inpatient glycemic control: Lantus 10 units daily, Novolog 0-15 units TID with meals  Inpatient Diabetes Program Recommendations: Insulin - Basal: Patient recieved Lantus 10 units on 01/30/16 at 3pm when transitioned off IV insulin. Please increase Lantus to 15 units daily.  Correction (SSI): Patient is considered to have Type1 diabetes and will require correction and meal coverage insulin as she makes no insulin at all. If meal coverage is ordered as recommended, may want to consider decreasing correction scale to sensitive correction scale and ADDING bedtime correction scale. Insulin - Meal Coverage: Patient is considered to have Type1 diabetes and will require correction and meal coverage insulin as she makes no insulin at all. Please order Novolog 5 units TID with meals for meal coverage (in addition to correction scale).  NOTE: Patient was transitioned off IV insulin to SQ insulin yesterday afternoon. She received Lantus 10 units on 01/30/16 at 3pm and Novolog 3 units at 17:30 for CBG of 219 mg/dl at 16:55. Patient has not received any insulin since then. As a result glucose is now up to 421 mg/dl at 8:13 am today. Talked with Celenia, RN regarding fasting glucose. BMET has been ordered to verify glucose and patient has not  eaten breakfast yet. Asked that Celenia, RN discuss recommendations with the doctor.   Thanks, Barnie Alderman, RN, MSN, CDE Diabetes Coordinator Inpatient Diabetes Program [phone] (Team Pager from [place] to [place]) [phone] (AP office) [phone] [facility] office) [phone] [facility] office)

## 2016-01-31 NOTE — Progress Notes (Signed)
Physical Therapy Evaluation Patient Details Name: Britanie Pereda MRN: [id number] DOB: 12-22-1965 Today's Date: 01/31/2016   History of Present Illness  Ms. 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  Clinical Impression  Pt admitted with above diagnosis. Pt currently with functional limitations due to the deficits listed below (see PT Problem List). Demonstrates instability with gait, requiring min assist due to posterior loss of balance. Improves significantly with use of a rolling walker for support. Delayed thought processing. VSS throughout therapy session. Pt will benefit from skilled PT to increase their independence and safety with mobility to allow discharge to the venue listed below.       Follow Up Recommendations Supervision for mobility/OOB (Pending progression - likely OPPT)    Equipment Recommendations  Other (comment) (Pending progression)    Recommendations for Other Services OT consult     Precautions / Restrictions Precautions Precautions: Fall Restrictions Weight Bearing Restrictions: No      Mobility  Bed Mobility Overal bed mobility: Modified Independent             General bed mobility comments: extra time  Transfers Overall transfer level: Needs assistance Equipment used: None Transfers: Sit to/from Stand Sit to Stand: Min guard         General transfer comment: Close guard for safety. Moderate sway with posterior loss of balance able to self correct. VC for sequencing.  Ambulation/Gait Ambulation/Gait assistance: Min assist Ambulation Distance (Feet): 150 Feet (+50) Assistive device: Rolling walker (2 wheeled);None Gait Pattern/deviations: Step-through pattern;Decreased stride length;Leaning posteriorly;Ataxic Gait velocity: decreased Gait velocity interpretation: Below normal speed  for age/gender General Gait Details: Demonstrates mild ataxia, requiring intermittent min assist for posterior loss of balance. VC for safety and awareness. Much improved stability with a rolling walker introduced. No buckling noted. Denies dizziness. Instability not exacerbated by vertical/horizontal head turns.  Stairs            Wheelchair Mobility    Modified Rankin (Stroke Patients Only)       Balance Overall balance assessment: Needs assistance Sitting-balance support: No upper extremity supported;Feet supported Sitting balance-Leahy Scale: Good     Standing balance support: No upper extremity supported Standing balance-Leahy Scale: Fair                               Pertinent Vitals/Pain Pain Assessment: No/denies pain    Home Living Family/patient expects to be discharged to:: Private residence Living Arrangements: Alone Available Help at Discharge: Family;Available PRN/intermittently Type of Home: House Home Access: Stairs to enter Entrance Stairs-Rails: Psychiatric nurse of Steps: 3-4 Home Layout: One level Home Equipment: None      Prior Function Level of Independence: Independent         Comments: Works with mental health in [place]        Extremity/Trunk Assessment   Upper Extremity Assessment: Defer to OT evaluation           Lower Extremity Assessment: Generalized weakness         Communication   Communication: No difficulties  Cognition Arousal/Alertness: Awake/alert Behavior During Therapy: WFL for tasks assessed/performed Overall Cognitive Status: No family/caregiver present to determine baseline cognitive functioning Area of Impairment: Problem solving             Problem  Solving: Slow processing      General Comments General comments (skin integrity, edema, etc.): Encouraged to ambulate with nursing    Exercises        Assessment/Plan    PT Assessment  Patient needs continued PT services  PT Diagnosis Abnormality of gait;Generalized weakness;Difficulty walking   PT Problem List Decreased strength;Decreased activity tolerance;Decreased balance;Decreased mobility;Decreased coordination;Decreased cognition;Decreased knowledge of use of DME  PT Treatment Interventions DME instruction;Gait training;Stair training;Functional mobility training;Therapeutic activities;Therapeutic exercise;Balance training;Neuromuscular re-education;Patient/family education;Cognitive remediation   PT Goals (Current goals can be found in the Care Plan section) Acute Rehab PT Goals Patient Stated Goal: Go home PT Goal Formulation: With patient Time For Goal Achievement: 02/14/16 Potential to Achieve Goals: Good    Frequency Min 3X/week   Barriers to discharge Decreased caregiver support Plans to stay with mother for 2 days at d/c. Daughter will not be home to assist pt until the 10th of march.    Co-evaluation               End of Session Equipment Utilized During Treatment: Gait belt Activity Tolerance: Patient tolerated treatment well Patient left: in chair;with call bell/phone within reach;with chair alarm set           Time: 1422-1441 PT Time Calculation (min) (ACUTE ONLY): 19 min   Charges:   PT Evaluation $PT Eval Low Complexity: 1 Procedure     PT G CodesEllouise Newer 01/31/2016, 3:42 PM  Camille Bal [place], [place]

## 2016-02-01 DIAGNOSIS — E860 Dehydration: Secondary | ICD-10-CM | POA: Insufficient documentation

## 2016-02-01 DIAGNOSIS — R471 Dysarthria and anarthria: Secondary | ICD-10-CM | POA: Insufficient documentation

## 2016-02-01 DIAGNOSIS — Z794 Long term (current) use of insulin: Secondary | ICD-10-CM

## 2016-02-01 DIAGNOSIS — E1010 Type 1 diabetes mellitus with ketoacidosis without coma: Secondary | ICD-10-CM

## 2016-02-01 LAB — GLUCOSE, CAPILLARY
GLUCOSE-CAPILLARY: 380 mg/dL — AB (ref 65–99)
GLUCOSE-CAPILLARY: 242 mg/dL — AB (ref 65–99)

## 2016-02-01 NOTE — Progress Notes (Signed)
Pt ready for d/c home per MD. Discharge instructions and insulin changes reviewed with pt, she denied any questions. Waiting for walker to be delivered before d/c. Will continue to monitor until that time.   [place], Jerry Caras

## 2016-02-01 NOTE — Progress Notes (Signed)
Physical Therapy Treatment Patient Details Name: Alyssa Floyd MRN: [id number] DOB: 1966/01/28 Today's Date: 02/01/2016    History of Present Illness Ms. 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    PT Comments    Patient is progressing toward mobility goals but continues to demonstrate balance deficits. Recommending HHPT for further skilled PT services to maximize independence and safety with mobility.   Follow Up Recommendations  Supervision for mobility/OOB;Home health PT     Equipment Recommendations  Rolling walker with 5" wheels    Recommendations for Other Services OT consult     Precautions / Restrictions Precautions Precautions: Fall Restrictions Weight Bearing Restrictions: No    Mobility  Bed Mobility Overal bed mobility: Modified Independent             General bed mobility comments: extra time  Transfers Overall transfer level: Needs assistance Equipment used: None Transfers: Sit to/from Stand Sit to Stand: Min guard         General transfer comment: pt with sway and posterior LOB upon standing but able to correct self; close guard for safety; pt given RW after stand  Ambulation/Gait Ambulation/Gait assistance: Min guard;Min assist Ambulation Distance (Feet): 250 Feet Assistive device: Rolling walker (2 wheeled);None Gait Pattern/deviations: Step-through pattern;Decreased stride length;Drifts right/left Gait velocity: decreased   General Gait Details: pt required min A initially to maintain balance with some drifting L/R throughout ambulation but with improvement noted; pt with no changes to balance with head turns while ambulating   Stairs Stairs: Yes Stairs assistance: Min assist Stair Management: One rail Right;One rail Left;Forwards (HHA ascending) Number of Stairs: 10 General  stair comments: pt educated on technique and given cues for increased safety awareness; no unsteadiness noted   Wheelchair Mobility    Modified Rankin (Stroke Patients Only)       Balance Overall balance assessment: Needs assistance Sitting-balance support: No upper extremity supported;Feet supported Sitting balance-Leahy Scale: Good     Standing balance support: No upper extremity supported Standing balance-Leahy Scale: Fair Standing balance comment: posterior lean upon standing                    Cognition Arousal/Alertness: Awake/alert Behavior During Therapy: WFL for tasks assessed/performed Overall Cognitive Status: No family/caregiver present to determine baseline cognitive functioning Area of Impairment: Problem solving             Problem Solving: Slow processing      Exercises      General Comments        Pertinent Vitals/Pain Pain Assessment: No/denies pain    Home Living                      Prior Function            PT Goals (current goals can now be found in the care plan section) Acute Rehab PT Goals Patient Stated Goal: Go home PT Goal Formulation: With patient Time For Goal Achievement: 02/14/16 Potential to Achieve Goals: Good Progress towards PT goals: Progressing toward goals    Frequency  Min 3X/week    PT Plan Current plan remains appropriate    Co-evaluation             End of Session Equipment Utilized During Treatment: Gait belt Activity Tolerance: Patient tolerated treatment well Patient left: in chair;with call bell/phone within  reach;with chair alarm set     Time: [phone] PT Time Calculation (min) (ACUTE ONLY): 16 min  Charges:  $Gait Training: 8-22 mins                    G Codes:      [place] April, PTA Pager: [phone]   02/01/2016, 9:15 AM

## 2016-02-01 NOTE — Care Management Note (Signed)
Case Management Note  Patient Details  Name: Alyssa Floyd MRN: [id number] Date of Birth: 27-May-1966  Subjective/Objective:      Admitted with [name]              Action/Plan: Spoke with patient about discharge plan, she will be staying with her mother Egypt Dieckhoff at [address] in [place]. Patient chose [facility], contacted them but they were unable to work with patient. Contacted [facility], spoke with Vickie, they will be able to start HHPT on 02/02/16. Faxed demographics, HHPT order, face to face, h and p, PT note and d/c summary to [phone] and received confirmation. Updated patient. Contacted James at [facility] and requested rolling walker be delivered to patient's room.  Expected Discharge Date:                  Expected Discharge Plan:  [place]  In-House Referral:  NA  Discharge planning Services  CM Consult  Post Acute Care Choice:  Durable Medical Equipment, Home Health Choice offered to:  Patient  DME Arranged:  Walker rolling DME Agency:  [place]:  PT [place]:  [place]  Status of Service:  Completed, signed off  Medicare Important Message Given:    Date Medicare IM Given:    Medicare IM give by:    Date Additional Medicare IM Given:    Additional Medicare Important Message give by:     If discussed at [place] of Stay Meetings, dates discussed:    Additional Comments:  Nila Nephew, RN 02/01/2016, 10:35 AM

## 2016-02-01 NOTE — Progress Notes (Signed)
Subjective: Patient ready for discharge yesterday but walker was unavailable. Feels well today. No complaints.  Objective: Vital signs in last 24 hours: Filed Vitals:   01/31/16 2051 01/31/16 2151 02/01/16 0548 02/01/16 0808  BP:  167/86 172/86 156/94  Pulse:  77 74 79  Temp: 98.1 F (36.7 C) 97.9 F (36.6 C) 98 F (36.7 C)   TempSrc: Oral Oral Oral   Resp:  18 18   Height:  5\' 6"  (1.676 m)    Weight:  70.9 kg (156 lb 4.9 oz)    SpO2:  98% 100%    Weight change: -1.5 kg (-3 lb 4.9 oz)  Intake/Output Summary (Last 24 hours) at 02/01/16 0933 Last data filed at 02/01/16 0900  Gross per 24 hour  Intake    960 ml  Output    300 ml  Net    660 ml    Physical Exam General Appearance:    Alert, cooperative, no distress, appears stated age  Head:    Normocephalic, without obvious abnormality, atraumatic  Eyes:    conjunctiva/corneas clear, EOM's intact  Nose:   Nares normal,no rhinorrhe  Throat:   Lips, mucosa, and tongue normal; teeth and gums normal  Lungs:     Clear to auscultation bilaterally, respirations unlabored  Chest Wall:    No tenderness or deformity   Heart:    Regular rate and rhythm, S1 and S2 normal, no murmur, rub   or gallop  Abdomen:     Soft, non-tender,     no masses, no organomegaly  Extremities:   Extremities normal, atraumatic, no cyanosis. 1+ pitting edema to the mid shin.  Skin:   Skin color, texture, turgor normal, no rashes or lesions  Neurologic:   Facial droop resolved, normal strength, sensation and reflexes    throughout   Lab Results: Basic Metabolic Panel:  Recent Labs  01/29/16 1401  01/31/16 0303 01/31/16 0920  NA  --   < > 134* 133*  K  --   < > 4.8 4.2  CL  --   < > 103 99*  CO2  --   < > 24 26  GLUCOSE  --   < > 387* 487*  BUN  --   < > 38* 35*  CREATININE  --   < > 1.65* 1.59*  CALCIUM  --   < > 7.7* 7.7*  MG 2.4  --   --   --   PHOS 5.7*  --   --   --   < > = values in this interval not displayed. Liver Function  Tests: No results for input(s): AST, ALT, ALKPHOS, BILITOT, PROT, ALBUMIN in the last 72 hours. No results for input(s): LIPASE, AMYLASE in the last 72 hours. No results for input(s): AMMONIA in the last 72 hours. CBC: No results for input(s): WBC, NEUTROABS, HGB, HCT, MCV, PLT in the last 72 hours. Cardiac Enzymes: No results for input(s): CKTOTAL, CKMB, CKMBINDEX, TROPONINI in the last 72 hours. BNP: No results for input(s): PROBNP in the last 72 hours. D-Dimer: No results for input(s): DDIMER in the last 72 hours. CBG:  Recent Labs  01/30/16 2142 01/31/16 0813 01/31/16 1209 01/31/16 1807 01/31/16 2157 02/01/16 0550  GLUCAP 220* 421* 341* 82 122* 380*   Hemoglobin A1C: No results for input(s): HGBA1C in the last 72 hours. Fasting Lipid Panel: No results for input(s): CHOL, HDL, LDLCALC, TRIG, CHOLHDL, LDLDIRECT in the last 72 hours. Thyroid Function Tests: No results for input(s):  TSH, T4TOTAL, FREET4, T3FREE, THYROIDAB in the last 72 hours. Anemia Panel: No results for input(s): VITAMINB12, FOLATE, FERRITIN, TIBC, IRON, RETICCTPCT in the last 72 hours. Coagulation: No results for input(s): LABPROT, INR in the last 72 hours. Urine Drug Screen: Drugs of Abuse     Component Value Date/Time   LABOPIA NONE DETECTED 01/29/2016 2316   COCAINSCRNUR NONE DETECTED 01/29/2016 2316   LABBENZ NONE DETECTED 01/29/2016 2316   AMPHETMU NONE DETECTED 01/29/2016 2316   THCU NONE DETECTED 01/29/2016 2316   LABBARB NONE DETECTED 01/29/2016 2316    Alcohol Level: No results for input(s): ETH in the last 72 hours. Urinalysis:  Recent Labs  01/29/16 2317  COLORURINE YELLOW  LABSPEC 1.021  PHURINE 5.0  GLUCOSEU >1000*  HGBUR SMALL*  BILIRUBINUR SMALL*  KETONESUR NEGATIVE  PROTEINUR 100*  NITRITE NEGATIVE  LEUKOCYTESUR NEGATIVE    Micro Results: Recent Results (from the past 240 hour(s))  MRSA PCR Screening     Status: None   Collection Time: 01/29/16 10:58 PM  Result  Value Ref Range Status   MRSA by PCR NEGATIVE NEGATIVE Final    Comment:        The GeneXpert MRSA Assay (FDA approved for NASAL specimens only), is one component of a comprehensive MRSA colonization surveillance program. It is not intended to diagnose MRSA infection nor to guide or monitor treatment for MRSA infections.    Studies/Results: No results found. Medications:  . antiseptic oral rinse  7 mL Mouth Rinse q12n4p  . chlorhexidine  15 mL Mouth Rinse BID  . enoxaparin (LOVENOX) injection  40 mg Subcutaneous Q24H  . ferrous sulfate  325 mg Oral Q breakfast  . insulin aspart  0-15 Units Subcutaneous TID WC  . insulin aspart  5 Units Subcutaneous TID WC  . insulin glargine  10 Units Subcutaneous Daily  . nystatin  5 mL Oral QID  . sodium chloride flush  3 mL Intravenous Q12H   acetaminophen **OR** acetaminophen, loperamide, promethazine  Assessment/Plan: Principal Problem:   [name], type 1 (HCC) Active Problems:   Acute renal failure (ARF) (HCC)   Prolonged Q-T interval on ECG   Hyperlipidemia   Hypertension   Chronic diarrhea   Nephrotic syndrome   Tobacco use   Facial paralysis   Normocytic anemia   Subclinical hypothyroidism   HHNC (hyperglycemic hyperosmolar nonketotic coma) ([place])   Acute encephalopathy   HHS in setting of DM1 Patient initially diagnosed with DM2, but recent hospitalization at [facility] for [name] revealed +GAD antibodies and low C-peptide suggesting DM1. Patient has been on insulin since shortly after diagnosis 13 years ago. This admission, pt presented with blood sugar 1361, serum osmolality 325, severe AMS, and no ketonuria, all favoring a diagnosis of HHS. Ms. Senter takes her medications every day but her diabetes has become increasingly difficult to control, leading to significant frustration for her. Her recent hospitalization at [facility] was her first hospitalization related to her diabestes. - Insulin infusion has been DC'd - Insulin regimen: Lantus  15 U nightly, Novolog 0-5 with meals, Novolog 5 with meals      - Blood sugar spiked this AM but pt denies eating sugary foods or other abnormal. Treated with novolog 15 +5, will recheck at lunch - Potassium has normalized (4.2 today) - Taking po and not requiring IV fluids  AKI in setting of Diabetic Nephropathy Baseline Cr ~1.1. At presentation Cr ~3, now has trended down to 1.6. At home takes torsemide 50 mg BID for lower extremity swelling. -  Hold torsemide while giving fluids for acute illness  Bells Palsy, resolved Patient p/w left facial droop and dysarthria. Also transiently had nystagmus and disconjugate gaze. Neuro consulted, CT neg, MRI neg, EEG normal. Likely 2/2 metabolic encephalopathy from HHS  Oral Thrush Identified on admission and persists today. Patient does not take home med for this. - HIV non-reactive - Continue nystatin po 4xday  History of hypertension Takes home lisinopril 80 mg qd. Chart review suggests patient tends to run 170/80. BP has been <170/80 this admission. - Restart lisinopril at discharge  History of hyperlipidemia - Hold home statin while in hospital  History of chronic diarrhea - Hold home pancrealipase and imodium while acutely ill  Subclinical Hashimoto's thyroiditis TSH elevated on prior clinic note. This admission, anti TPO antibody+. No home thyroid med due to subclinical. - Note to PCP at discharge  History of tobacco use  Smokes 6 cigs daily.  - Pt counseled on cessation  DVT prophylaxis - Continue lovenox 30mg  qd  This is a Careers information officer Note.  The care of the patient was discussed with Dr. Hulen Luster and the assessment and plan formulated with their assistance.  Please see their attached note for official documentation of the daily encounter.   LOS: 3 days   Claudius Sis, Med Student 02/01/2016, 9:33 AM

## 2016-02-01 NOTE — Progress Notes (Addendum)
Subjective:  No overnight events. Denies any complaints.   Objective: Vital signs in last 24 hours: Filed Vitals:   01/31/16 2051 01/31/16 2151 02/01/16 0548 02/01/16 0808  BP:  167/86 172/86 156/94  Pulse:  77 74 79  Temp: 98.1 F (36.7 C) 97.9 F (36.6 C) 98 F (36.7 C)   TempSrc: Oral Oral Oral   Resp:  18 18   Height:  5\' 6"  (1.676 m)    Weight:  156 lb 4.9 oz (70.9 kg)    SpO2:  98% 100%    Weight change: -3 lb 4.9 oz (-1.5 kg)  Intake/Output Summary (Last 24 hours) at 02/01/16 0958 Last data filed at 02/01/16 0900  Gross per 24 hour  Intake    960 ml  Output    300 ml  Net    660 ml   Physical Exam  Constitutional: She is oriented to person, place, and time. She appears well-developed and well-nourished. No distress.  HENT:  Head: Normocephalic and atraumatic.  Symmetric smile   Eyes: Conjunctivae and EOM are normal. Right eye exhibits no discharge. Left eye exhibits no discharge. No scleral icterus.  Cardiovascular: Normal rate, regular rhythm and normal heart sounds.  Exam reveals no gallop and no friction rub.   No murmur heard. Pulmonary/Chest: Effort normal and breath sounds normal. No respiratory distress. She has no wheezes. She has no rales.  Abdominal: Soft. Bowel sounds are normal. She exhibits no distension and no mass. There is no tenderness. There is no rebound and no guarding.  Musculoskeletal: She exhibits edema (2+ pedal edema b/l).  Neurological: She is alert and oriented to person, place, and time.  Skin: Skin is warm and dry. She is not diaphoretic.   Lab Results: Basic Metabolic Panel:  Recent Labs Lab 01/29/16 1401  01/31/16 0303 01/31/16 0920  NA  --   < > 134* 133*  K  --   < > 4.8 4.2  CL  --   < > 103 99*  CO2  --   < > 24 26  GLUCOSE  --   < > 387* 487*  BUN  --   < > 38* 35*  CREATININE  --   < > 1.65* 1.59*  CALCIUM  --   < > 7.7* 7.7*  MG 2.4  --   --   --   PHOS 5.7*  --   --   --   < > = values in this interval not  displayed. Liver Function Tests:  Recent Labs Lab 01/29/16 0904  AST 19  ALT 22  ALKPHOS 122  BILITOT 1.0  PROT 5.2*  ALBUMIN 2.0*   CBC:  Recent Labs Lab 01/29/16 0904  WBC 10.5  NEUTROABS 9.1*  HGB 11.2*  10.2*  HCT 33.0*  31.8*  MCV 86.9  PLT 300   CBG:  Recent Labs Lab 01/30/16 2142 01/31/16 0813 01/31/16 1209 01/31/16 1807 01/31/16 2157 02/01/16 0550  GLUCAP 220* 421* 341* 82 122* 380*   Coagulation:  Recent Labs Lab 01/29/16 0904  LABPROT 12.6  INR 0.92    Urinalysis:  Recent Labs Lab 01/29/16 2317  COLORURINE YELLOW  LABSPEC 1.021  PHURINE 5.0  GLUCOSEU >1000*  HGBUR SMALL*  BILIRUBINUR SMALL*  KETONESUR NEGATIVE  PROTEINUR 100*  NITRITE NEGATIVE  LEUKOCYTESUR NEGATIVE   Micro Results: Recent Results (from the past 240 hour(s))  MRSA PCR Screening     Status: None   Collection Time: 01/29/16 10:58 PM  Result  Value Ref Range Status   MRSA by PCR NEGATIVE NEGATIVE Final    Comment:        The GeneXpert MRSA Assay (FDA approved for NASAL specimens only), is one component of a comprehensive MRSA colonization surveillance program. It is not intended to diagnose MRSA infection nor to guide or monitor treatment for MRSA infections.    Medications: I have reviewed the patient's current medications. Scheduled Meds: . antiseptic oral rinse  7 mL Mouth Rinse q12n4p  . chlorhexidine  15 mL Mouth Rinse BID  . enoxaparin (LOVENOX) injection  40 mg Subcutaneous Q24H  . ferrous sulfate  325 mg Oral Q breakfast  . insulin aspart  0-15 Units Subcutaneous TID WC  . insulin aspart  5 Units Subcutaneous TID WC  . insulin glargine  10 Units Subcutaneous Daily  . nystatin  5 mL Oral QID  . sodium chloride flush  3 mL Intravenous Q12H   Continuous Infusions: . insulin (NOVOLIN-R) infusion Stopped (01/30/16 1700)   PRN Meds:.acetaminophen **OR** acetaminophen, loperamide, promethazine Assessment/Plan: Principal Problem:   [name], type 1  (HCC) Active Problems:   Acute renal failure (ARF) (HCC)   Prolonged Q-T interval on ECG   Hyperlipidemia   Hypertension   Chronic diarrhea   Nephrotic syndrome   Tobacco use   Facial paralysis   Normocytic anemia   Subclinical hypothyroidism   HHNC (hyperglycemic hyperosmolar nonketotic coma) ([place])   Acute encephalopathy  HHS--resolved. CBGs have improved with addition of novolog 5 units w/ meals plus SSI. D/c home today w/ lantus 15 units daily and novolog 5 units w/ meals.   Facial paralysis and dysarthria-- resolved  Nonoliguric AKI with nephrotic syndrome due to diabetic nephropathy - b/l creatinine around 1.1, Cr today is 1.65 down from 3.04 on admission yesterday.  -resume home torsemide 50 mg BID and lisinopril 80 mg daily on d/c.   DVT PPx: Lovenox  Code: Full   Dispo: Anticipated discharge today  The patient does have a current PCP Steele Sizer, MD) and does not need an [facility] hospital follow-up appointment after discharge.  The patient does not have transportation limitations that hinder transportation to clinic appointments.  .Services Needed at time of discharge: Y = Yes, Blank = No PT:   OT:   RN:   Equipment:   Other:     LOS: 3 days   Norman Herrlich, MD 02/01/2016, 9:58 AM

## 2016-05-24 DIAGNOSIS — C50411 Malignant neoplasm of upper-outer quadrant of right female breast: Secondary | ICD-10-CM | POA: Insufficient documentation

## 2016-07-06 DIAGNOSIS — E10319 Type 1 diabetes mellitus with unspecified diabetic retinopathy without macular edema: Secondary | ICD-10-CM | POA: Insufficient documentation

## 2016-07-15 DIAGNOSIS — C773 Secondary and unspecified malignant neoplasm of axilla and upper limb lymph nodes: Secondary | ICD-10-CM

## 2016-07-15 DIAGNOSIS — C50911 Malignant neoplasm of unspecified site of right female breast: Secondary | ICD-10-CM | POA: Insufficient documentation

## 2016-08-10 ENCOUNTER — Ambulatory Visit

## 2016-08-25 DIAGNOSIS — M60852 Other myositis, left thigh: Secondary | ICD-10-CM | POA: Insufficient documentation

## 2016-09-26 DIAGNOSIS — R55 Syncope and collapse: Secondary | ICD-10-CM | POA: Insufficient documentation

## 2016-12-28 ENCOUNTER — Emergency Department
Admission: EM | Admit: 2016-12-28 | Discharge: 2016-12-28 | Disposition: A | Discharge status: Home / Self Care | Attending: Emergency Medicine | Admitting: Emergency Medicine

## 2016-12-28 DIAGNOSIS — D72819 Decreased white blood cell count, unspecified: Secondary | ICD-10-CM | POA: Diagnosis not present

## 2016-12-28 DIAGNOSIS — Z79899 Other long term (current) drug therapy: Secondary | ICD-10-CM | POA: Diagnosis not present

## 2016-12-28 DIAGNOSIS — E10649 Type 1 diabetes mellitus with hypoglycemia without coma: Secondary | ICD-10-CM | POA: Diagnosis present

## 2016-12-28 DIAGNOSIS — F17210 Nicotine dependence, cigarettes, uncomplicated: Secondary | ICD-10-CM | POA: Insufficient documentation

## 2016-12-28 DIAGNOSIS — E039 Hypothyroidism, unspecified: Secondary | ICD-10-CM | POA: Insufficient documentation

## 2016-12-28 DIAGNOSIS — E162 Hypoglycemia, unspecified: Secondary | ICD-10-CM

## 2016-12-28 DIAGNOSIS — I10 Essential (primary) hypertension: Secondary | ICD-10-CM | POA: Insufficient documentation

## 2016-12-28 DIAGNOSIS — D696 Thrombocytopenia, unspecified: Secondary | ICD-10-CM | POA: Diagnosis not present

## 2016-12-28 LAB — CBC WITH DIFFERENTIAL/PLATELET
BAND NEUTROPHILS: 1 %
BLASTS: 0 %
Basophils Absolute: 0.1 10*3/uL (ref 0–0.1)
Basophils Relative: 2 %
EOS ABS: 0.1 10*3/uL (ref 0–0.7)
Eosinophils Relative: 2 %
HCT: 26.5 % — ABNORMAL LOW (ref 35.0–47.0)
HEMOGLOBIN: 8.7 g/dL — AB (ref 12.0–16.0)
LYMPHS PCT: 17 %
Lymphs Abs: 0.5 10*3/uL — ABNORMAL LOW (ref 1.0–3.6)
MCH: 30.2 pg (ref 26.0–34.0)
MCHC: 32.9 g/dL (ref 32.0–36.0)
MCV: 91.8 fL (ref 80.0–100.0)
Metamyelocytes Relative: 1 %
Monocytes Absolute: 0.3 10*3/uL (ref 0.2–0.9)
Monocytes Relative: 10 %
Myelocytes: 0 %
NEUTROS PCT: 67 %
NRBC: 0 /100{WBCs}
Neutro Abs: 1.9 10*3/uL (ref 1.4–6.5)
OTHER: 0 %
PROMYELOCYTES ABS: 0 %
Platelets: 84 10*3/uL — ABNORMAL LOW (ref 150–440)
RBC: 2.89 MIL/uL — ABNORMAL LOW (ref 3.80–5.20)
RDW: 16.8 % — ABNORMAL HIGH (ref 11.5–14.5)
WBC: 2.9 10*3/uL — AB (ref 3.6–11.0)

## 2016-12-28 LAB — URINALYSIS, COMPLETE (UACMP) WITH MICROSCOPIC
Bilirubin Urine: NEGATIVE
Glucose, UA: 50 mg/dL — AB
Hgb urine dipstick: NEGATIVE
Ketones, ur: NEGATIVE mg/dL
Leukocytes, UA: NEGATIVE
NITRITE: NEGATIVE
PH: 6 (ref 5.0–8.0)
Protein, ur: 100 mg/dL — AB
SPECIFIC GRAVITY, URINE: 1.008 (ref 1.005–1.030)

## 2016-12-28 LAB — COMPREHENSIVE METABOLIC PANEL
ALK PHOS: 191 U/L — AB (ref 38–126)
ALT: 22 U/L (ref 14–54)
AST: 18 U/L (ref 15–41)
Albumin: 2.3 g/dL — ABNORMAL LOW (ref 3.5–5.0)
Anion gap: 6 (ref 5–15)
BUN: 69 mg/dL — AB (ref 6–20)
CALCIUM: 8.1 mg/dL — AB (ref 8.9–10.3)
CO2: 30 mmol/L (ref 22–32)
CREATININE: 1.52 mg/dL — AB (ref 0.44–1.00)
Chloride: 103 mmol/L (ref 101–111)
GFR calc non Af Amer: 39 mL/min — ABNORMAL LOW (ref >60)
GFR, EST AFRICAN AMERICAN: 45 mL/min — AB (ref >60)
GLUCOSE: 132 mg/dL — AB (ref 65–99)
Potassium: 3.6 mmol/L (ref 3.5–5.1)
SODIUM: 139 mmol/L (ref 135–145)
Total Bilirubin: 0.6 mg/dL (ref 0.3–1.2)
Total Protein: 5.4 g/dL — ABNORMAL LOW (ref 6.5–8.1)

## 2016-12-28 LAB — GLUCOSE, CAPILLARY
GLUCOSE-CAPILLARY: 368 mg/dL — AB (ref 65–99)
Glucose-Capillary: 258 mg/dL — ABNORMAL HIGH (ref 65–99)

## 2016-12-28 MED ORDER — ONDANSETRON 4 MG PO TBDP
ORAL_TABLET | ORAL | Status: AC
  Administered 2016-12-28: 19:00:00
  Filled 2016-12-28: qty 1

## 2016-12-28 NOTE — ED Notes (Signed)
No epad signature for pt to sign for discharge.

## 2016-12-28 NOTE — ED Notes (Signed)
Pt became nauseated upon standing to go to bathroom, see med note

## 2016-12-28 NOTE — ED Provider Notes (Signed)
[facility] Emergency Department Provider Note  ____________________________________________   None    (approximate)  I have reviewed the triage vital signs and the nursing notes.   HISTORY  Chief Complaint Hypoglycemia   HPI Alyssa Floyd is a 51 y.o. female with a history of breast cancer as well as diabetes who is presenting after a hypoglycemic episode. She was in a gas station bathroom for about an hour. Apparently somebody opened the door and thought that they had "walked in on the patient." However, when they returned later she was still in the bathroom. At that time EMS was called and found her to have a blood sugar of 25. An IV was started and dextrose was given with return of the patient's mental status. The patient reports feeling weak but without any pain. She says that she does not remember if she skipped a meal or took more insulin than she was prescribed. She says that she takes only injection insulin and does not take any oral  diabetic agents. She says that she had chemotherapy one to 2 weeks ago and has been feeling slightly weak ever since with intermittent diarrhea which she says is normal for her. She does report some mild nausea at this time.    Past Medical History:  Diagnosis Date  . 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 Diabetes mellitus ([place])   . Diabetic nephropathy ([place])   . Hyperlipidemia   . Hypertension   . Nephrotic syndrome    diabetic nephropathy, biopsy on 01/06/16 at [facility]  . Normocytic anemia   . Subclinical hypothyroidism   . Tobacco use   . Vitamin D deficiency     Patient Active Problem List   Diagnosis Date Noted  . Dehydration   . Dysarthria   . HHNC (hyperglycemic hyperosmolar nonketotic coma) ([place])   . Acute encephalopathy   . Acute renal failure (ARF) ([place]) 01/29/2016  . [name], type 1 ([place]) 01/29/2016  . Prolonged Q-T interval on ECG 01/29/2016  . Hyperlipidemia 01/29/2016  .  Hypertension 01/29/2016  . Chronic diarrhea 01/29/2016  . Nephrotic syndrome 01/29/2016  . Tobacco use 01/29/2016  . 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 Normocytic anemia 01/29/2016  . Subclinical hypothyroidism 01/29/2016  . Encephalopathy acute     Past Surgical History:  Procedure Laterality Date  . PARTIAL HYSTERECTOMY      Prior to Admission medications   Medication Sig Start Date End Date Taking? Authorizing Provider  atorvastatin (LIPITOR) 40 MG tablet Take 20 mg by mouth daily. 01/18/16   Historical Provider, MD  ferrous sulfate 325 (65 FE) MG tablet Take 325 mg by mouth daily with breakfast.    Historical Provider, MD  insulin aspart (NOVOLOG) 100 UNIT/ML injection Inject 5 Units into the skin 3 (three) times daily with meals. 01/31/16   Norman Herrlich, MD  insulin glargine (LANTUS) 100 UNIT/ML injection Inject 0.15 mLs (15 Units total) into the skin daily. 01/31/16   Norman Herrlich, MD  lisinopril (PRINIVIL,ZESTRIL) 40 MG tablet Take 80 mg by mouth daily. 01/18/16 01/17/17  Historical Provider, MD  magnesium oxide (MAG-OX) 400 MG tablet Take 400 mg by mouth 2 (two) times daily. 01/18/16   Historical Provider, MD  Multiple Vitamin (MULTIVITAMIN WITH MINERALS) TABS tablet Take 1 tablet by mouth daily.    Historical Provider, MD  potassium chloride SA (K-DUR,KLOR-CON) 20 MEQ tablet Take 20 mEq by mouth daily. 01/12/16   Historical Provider, MD  Protein POWD  25 g by Does not apply route 2 (two) times daily. Patient not taking: Reported on 01/29/2016 12/20/15   Harvest Dark, MD  torsemide (DEMADEX) 100 MG tablet Take 50 mg by mouth 2 (two) times daily. 01/12/16   Historical Provider, MD  Vitamin D, Ergocalciferol, (DRISDOL) 50000 units CAPS capsule Take 50,000 Units by mouth once a week. 01/26/16   Historical Provider, MD  ZENPEP 5000 units CPEP Take 10,000 Units by mouth 3 (three) times daily with meals. 01/13/16   Historical Provider, MD    Allergies Penicillins  Family History  Problem  Relation Age of Onset  . Diabetes      Social History Social History  Substance Use Topics  . Smoking status: Current Every Day Smoker    Packs/day: 0.50    Types: Cigarettes  . Smokeless tobacco: Never Used  . Alcohol use No    Review of Systems Constitutional: No fever/chills Eyes: No visual changes. ENT: No sore throat. Cardiovascular: Denies chest pain. Respiratory: Denies shortness of breath. Gastrointestinal: No abdominal pain.   no vomiting.  no constipation. Genitourinary: Negative for dysuria. Musculoskeletal: Negative for back pain. Skin: Negative for rash. Neurological: Negative for headaches, focal weakness or numbness.  10-point ROS otherwise negative.  ____________________________________________   PHYSICAL EXAM:  VITAL SIGNS: ED Triage Vitals  Enc Vitals Group     BP      Pulse      Resp      Temp      Temp src      SpO2      Weight      Height      Head Circumference      Peak Flow      Pain Score      Pain Loc      Pain Edu?      Excl. in [place]?     Constitutional: Alert and oriented. Well appearing and in no acute distress. Eyes: Conjunctivae are normal. PERRL. EOMI. Head: Atraumatic. Nose: No congestion/rhinnorhea. Mouth/Throat: Mucous membranes are moist.   Neck: No stridor.   Cardiovascular: Normal rate, regular rhythm. Grossly normal heart sounds.   Respiratory: Normal respiratory effort.  No retractions. Lungs CTAB. Gastrointestinal: Soft and nontender. No distention.  Musculoskeletal: No lower extremity tenderness nor edema.  No joint effusions. Neurologic:  Normal speech and language. No gross focal neurologic deficits are appreciated.  Skin:  Skin is warm, dry and intact. No rash noted. Psychiatric: Mood and affect are normal. Speech and behavior are normal.  ____________________________________________   LABS (all labs ordered are listed, but only abnormal results are displayed)  Labs Reviewed  CBC WITH  DIFFERENTIAL/PLATELET - Abnormal; Notable for the following:       Result Value   WBC 2.9 (*)    RBC 2.89 (*)    Hemoglobin 8.7 (*)    HCT 26.5 (*)    RDW 16.8 (*)    Platelets 84 (*)    Lymphs Abs 0.5 (*)    All other components within normal limits  COMPREHENSIVE METABOLIC PANEL - Abnormal; Notable for the following:    Glucose, Bld 132 (*)    BUN 69 (*)    Creatinine, Ser 1.52 (*)    Calcium 8.1 (*)    Total Protein 5.4 (*)    Albumin 2.3 (*)    Alkaline Phosphatase 191 (*)    GFR calc non Af Amer 39 (*)    GFR calc Af Amer 45 (*)    All other  components within normal limits  URINALYSIS, COMPLETE (UACMP) WITH MICROSCOPIC - Abnormal; Notable for the following:    Color, Urine STRAW (*)    APPearance HAZY (*)    Glucose, UA 50 (*)    Protein, ur 100 (*)    Bacteria, UA FEW (*)    Squamous Epithelial / LPF 0-5 (*)    All other components within normal limits  GLUCOSE, CAPILLARY - Abnormal; Notable for the following:    Glucose-Capillary 258 (*)    All other components within normal limits  GLUCOSE, CAPILLARY - Abnormal; Notable for the following:    Glucose-Capillary 368 (*)    All other components within normal limits  CBG MONITORING, ED  CBG MONITORING, ED  CBG MONITORING, ED  CBG MONITORING, ED   ____________________________________________  EKG   ____________________________________________  RADIOLOGY   ____________________________________________   PROCEDURES  Procedure(s) performed:   Procedures  Critical Care performed:   ____________________________________________   INITIAL IMPRESSION / ASSESSMENT AND PLAN / ED COURSE  Pertinent labs & imaging results that were available during my care of the patient were reviewed by me and considered in my medical decision making (see chart for details).  ----------------------------------------- 10:32 PM on 12/28/2016 -----------------------------------------  Patient with repeat glucose of 368. Says  that she does take a nighttime insulin before she goes to bed. Was able tolerate by mouth here. Unclear exactly what precipitated the hypoglycemic episode today. However, the patient has maintained her blood sugar since being resuscitated with dextrose and then eating food here in the emergency department. We discussed not skipping meals as well as keeping hard candy or pain of her crackers on her person throughout the day. She still does not remember the preceding events or if she skipped any meals or missed dosed her insulin. However, we did discuss these possibilities. She'll be following up with her oncologist. We also discussed her low white blood cell count which she says her oncologist water may happen. We also discussed her low platelet count. Both these findings may be related to her chemotherapy. She'll be following up with her cancer doctor due for repeat blood testing.      ____________________________________________   FINAL CLINICAL IMPRESSION(S) / ED DIAGNOSES  Hypoglycemia. Leukopenia. Thrombocytopenia    NEW MEDICATIONS STARTED DURING THIS VISIT:  New Prescriptions   No medications on file     Note:  This document was prepared using Dragon voice recognition software and may include unintentional dictation errors.    Orbie Pyo, MD 12/28/16 [phone]

## 2017-10-18 DIAGNOSIS — E8809 Other disorders of plasma-protein metabolism, not elsewhere classified: Secondary | ICD-10-CM | POA: Insufficient documentation

## 2017-12-19 DIAGNOSIS — T148XXA Other injury of unspecified body region, initial encounter: Secondary | ICD-10-CM

## 2017-12-19 DIAGNOSIS — L089 Local infection of the skin and subcutaneous tissue, unspecified: Secondary | ICD-10-CM | POA: Insufficient documentation

## 2017-12-20 DIAGNOSIS — N185 Chronic kidney disease, stage 5: Secondary | ICD-10-CM | POA: Insufficient documentation

## 2017-12-25 DIAGNOSIS — L602 Onychogryphosis: Secondary | ICD-10-CM | POA: Insufficient documentation

## 2017-12-25 DIAGNOSIS — B351 Tinea unguium: Secondary | ICD-10-CM | POA: Insufficient documentation

## 2017-12-25 DIAGNOSIS — L97529 Non-pressure chronic ulcer of other part of left foot with unspecified severity: Secondary | ICD-10-CM | POA: Insufficient documentation

## 2018-01-02 DIAGNOSIS — M86179 Other acute osteomyelitis, unspecified ankle and foot: Secondary | ICD-10-CM | POA: Insufficient documentation

## 2018-04-04 DIAGNOSIS — M25611 Stiffness of right shoulder, not elsewhere classified: Secondary | ICD-10-CM | POA: Insufficient documentation

## 2018-07-07 ENCOUNTER — Inpatient Hospital Stay (HOSPITAL_COMMUNITY)
Admission: EM | Admit: 2018-07-07 | Discharge: 2018-07-11 | DRG: 638 | Disposition: A | Discharge status: Home / Self Care | Attending: Internal Medicine | Admitting: Internal Medicine

## 2018-07-07 DIAGNOSIS — E1022 Type 1 diabetes mellitus with diabetic chronic kidney disease: Secondary | ICD-10-CM | POA: Diagnosis present

## 2018-07-07 DIAGNOSIS — Z853 Personal history of malignant neoplasm of breast: Secondary | ICD-10-CM

## 2018-07-07 DIAGNOSIS — Z6828 Body mass index (BMI) 28.0-28.9, adult: Secondary | ICD-10-CM

## 2018-07-07 DIAGNOSIS — Z9010 Acquired absence of unspecified breast and nipple: Secondary | ICD-10-CM

## 2018-07-07 DIAGNOSIS — E875 Hyperkalemia: Secondary | ICD-10-CM | POA: Diagnosis present

## 2018-07-07 DIAGNOSIS — F329 Major depressive disorder, single episode, unspecified: Secondary | ICD-10-CM | POA: Diagnosis not present

## 2018-07-07 DIAGNOSIS — K529 Noninfective gastroenteritis and colitis, unspecified: Secondary | ICD-10-CM | POA: Diagnosis not present

## 2018-07-07 DIAGNOSIS — Z880 Allergy status to penicillin: Secondary | ICD-10-CM | POA: Diagnosis not present

## 2018-07-07 DIAGNOSIS — E1021 Type 1 diabetes mellitus with diabetic nephropathy: Secondary | ICD-10-CM | POA: Diagnosis not present

## 2018-07-07 DIAGNOSIS — E10649 Type 1 diabetes mellitus with hypoglycemia without coma: Secondary | ICD-10-CM | POA: Diagnosis not present

## 2018-07-07 DIAGNOSIS — Z833 Family history of diabetes mellitus: Secondary | ICD-10-CM | POA: Diagnosis not present

## 2018-07-07 DIAGNOSIS — D631 Anemia in chronic kidney disease: Secondary | ICD-10-CM | POA: Diagnosis present

## 2018-07-07 DIAGNOSIS — Z9221 Personal history of antineoplastic chemotherapy: Secondary | ICD-10-CM | POA: Diagnosis not present

## 2018-07-07 DIAGNOSIS — Z923 Personal history of irradiation: Secondary | ICD-10-CM | POA: Diagnosis not present

## 2018-07-07 DIAGNOSIS — Z794 Long term (current) use of insulin: Secondary | ICD-10-CM | POA: Diagnosis not present

## 2018-07-07 DIAGNOSIS — E669 Obesity, unspecified: Secondary | ICD-10-CM | POA: Diagnosis present

## 2018-07-07 DIAGNOSIS — Z9114 Patient's other noncompliance with medication regimen: Secondary | ICD-10-CM

## 2018-07-07 DIAGNOSIS — Z90711 Acquired absence of uterus with remaining cervical stump: Secondary | ICD-10-CM

## 2018-07-07 DIAGNOSIS — E039 Hypothyroidism, unspecified: Secondary | ICD-10-CM | POA: Diagnosis present

## 2018-07-07 DIAGNOSIS — E1010 Type 1 diabetes mellitus with ketoacidosis without coma: Secondary | ICD-10-CM | POA: Diagnosis not present

## 2018-07-07 DIAGNOSIS — E785 Hyperlipidemia, unspecified: Secondary | ICD-10-CM | POA: Diagnosis not present

## 2018-07-07 DIAGNOSIS — D649 Anemia, unspecified: Secondary | ICD-10-CM | POA: Diagnosis not present

## 2018-07-07 DIAGNOSIS — N179 Acute kidney failure, unspecified: Secondary | ICD-10-CM | POA: Diagnosis present

## 2018-07-07 DIAGNOSIS — J9690 Respiratory failure, unspecified, unspecified whether with hypoxia or hypercapnia: Secondary | ICD-10-CM

## 2018-07-07 DIAGNOSIS — Z9011 Acquired absence of right breast and nipple: Secondary | ICD-10-CM

## 2018-07-07 DIAGNOSIS — E878 Other disorders of electrolyte and fluid balance, not elsewhere classified: Secondary | ICD-10-CM | POA: Diagnosis present

## 2018-07-07 DIAGNOSIS — I129 Hypertensive chronic kidney disease with stage 1 through stage 4 chronic kidney disease, or unspecified chronic kidney disease: Secondary | ICD-10-CM | POA: Diagnosis present

## 2018-07-07 DIAGNOSIS — N184 Chronic kidney disease, stage 4 (severe): Secondary | ICD-10-CM | POA: Diagnosis not present

## 2018-07-07 DIAGNOSIS — E1110 Type 2 diabetes mellitus with ketoacidosis without coma: Secondary | ICD-10-CM | POA: Diagnosis present

## 2018-07-07 DIAGNOSIS — F17210 Nicotine dependence, cigarettes, uncomplicated: Secondary | ICD-10-CM | POA: Diagnosis present

## 2018-07-07 DIAGNOSIS — Z79899 Other long term (current) drug therapy: Secondary | ICD-10-CM | POA: Diagnosis not present

## 2018-07-07 DIAGNOSIS — R0682 Tachypnea, not elsewhere classified: Secondary | ICD-10-CM | POA: Diagnosis present

## 2018-07-08 ENCOUNTER — Emergency Department (HOSPITAL_COMMUNITY)

## 2018-07-08 ENCOUNTER — Encounter (HOSPITAL_COMMUNITY): Payer: Self-pay

## 2018-07-08 DIAGNOSIS — E039 Hypothyroidism, unspecified: Secondary | ICD-10-CM | POA: Diagnosis present

## 2018-07-08 DIAGNOSIS — Z79899 Other long term (current) drug therapy: Secondary | ICD-10-CM | POA: Diagnosis not present

## 2018-07-08 DIAGNOSIS — E0810 Diabetes mellitus due to underlying condition with ketoacidosis without coma: Secondary | ICD-10-CM | POA: Diagnosis not present

## 2018-07-08 DIAGNOSIS — E10649 Type 1 diabetes mellitus with hypoglycemia without coma: Secondary | ICD-10-CM | POA: Diagnosis present

## 2018-07-08 DIAGNOSIS — N184 Chronic kidney disease, stage 4 (severe): Secondary | ICD-10-CM | POA: Diagnosis present

## 2018-07-08 DIAGNOSIS — E875 Hyperkalemia: Secondary | ICD-10-CM | POA: Diagnosis present

## 2018-07-08 DIAGNOSIS — Z853 Personal history of malignant neoplasm of breast: Secondary | ICD-10-CM | POA: Diagnosis not present

## 2018-07-08 DIAGNOSIS — I10 Essential (primary) hypertension: Secondary | ICD-10-CM | POA: Diagnosis not present

## 2018-07-08 DIAGNOSIS — D631 Anemia in chronic kidney disease: Secondary | ICD-10-CM | POA: Diagnosis present

## 2018-07-08 DIAGNOSIS — R601 Generalized edema: Secondary | ICD-10-CM | POA: Diagnosis not present

## 2018-07-08 DIAGNOSIS — Z9011 Acquired absence of right breast and nipple: Secondary | ICD-10-CM | POA: Diagnosis not present

## 2018-07-08 DIAGNOSIS — Z90711 Acquired absence of uterus with remaining cervical stump: Secondary | ICD-10-CM | POA: Diagnosis not present

## 2018-07-08 DIAGNOSIS — Z880 Allergy status to penicillin: Secondary | ICD-10-CM | POA: Diagnosis not present

## 2018-07-08 DIAGNOSIS — E1110 Type 2 diabetes mellitus with ketoacidosis without coma: Secondary | ICD-10-CM | POA: Diagnosis present

## 2018-07-08 DIAGNOSIS — E1010 Type 1 diabetes mellitus with ketoacidosis without coma: Secondary | ICD-10-CM | POA: Diagnosis present

## 2018-07-08 DIAGNOSIS — N179 Acute kidney failure, unspecified: Secondary | ICD-10-CM | POA: Diagnosis present

## 2018-07-08 DIAGNOSIS — E785 Hyperlipidemia, unspecified: Secondary | ICD-10-CM | POA: Diagnosis present

## 2018-07-08 DIAGNOSIS — Z794 Long term (current) use of insulin: Secondary | ICD-10-CM | POA: Diagnosis not present

## 2018-07-08 DIAGNOSIS — D649 Anemia, unspecified: Secondary | ICD-10-CM | POA: Diagnosis present

## 2018-07-08 DIAGNOSIS — Z833 Family history of diabetes mellitus: Secondary | ICD-10-CM | POA: Diagnosis not present

## 2018-07-08 DIAGNOSIS — I129 Hypertensive chronic kidney disease with stage 1 through stage 4 chronic kidney disease, or unspecified chronic kidney disease: Secondary | ICD-10-CM | POA: Diagnosis present

## 2018-07-08 DIAGNOSIS — Z923 Personal history of irradiation: Secondary | ICD-10-CM | POA: Diagnosis not present

## 2018-07-08 DIAGNOSIS — K529 Noninfective gastroenteritis and colitis, unspecified: Secondary | ICD-10-CM | POA: Diagnosis present

## 2018-07-08 DIAGNOSIS — Z9221 Personal history of antineoplastic chemotherapy: Secondary | ICD-10-CM | POA: Diagnosis not present

## 2018-07-08 DIAGNOSIS — E1021 Type 1 diabetes mellitus with diabetic nephropathy: Secondary | ICD-10-CM | POA: Diagnosis present

## 2018-07-08 DIAGNOSIS — Z9010 Acquired absence of unspecified breast and nipple: Secondary | ICD-10-CM | POA: Diagnosis not present

## 2018-07-08 DIAGNOSIS — F329 Major depressive disorder, single episode, unspecified: Secondary | ICD-10-CM | POA: Diagnosis present

## 2018-07-08 LAB — BASIC METABOLIC PANEL
ANION GAP: 17 — AB (ref 5–15)
ANION GAP: 14 (ref 5–15)
ANION GAP: 8 (ref 5–15)
BUN: 55 mg/dL — AB (ref 6–20)
BUN: 57 mg/dL — ABNORMAL HIGH (ref 6–20)
BUN: 54 mg/dL — ABNORMAL HIGH (ref 6–20)
BUN: 50 mg/dL — ABNORMAL HIGH (ref 6–20)
BUN: 53 mg/dL — ABNORMAL HIGH (ref 6–20)
BUN: 49 mg/dL — AB (ref 6–20)
CHLORIDE: 101 mmol/L (ref 98–111)
CHLORIDE: 105 mmol/L (ref 98–111)
CHLORIDE: 108 mmol/L (ref 98–111)
CHLORIDE: 109 mmol/L (ref 98–111)
CO2: 12 mmol/L — AB (ref 22–32)
CO2: <7 mmol/L — ABNORMAL LOW (ref 22–32)
CO2: 17 mmol/L — ABNORMAL LOW (ref 22–32)
CO2: 19 mmol/L — AB (ref 22–32)
CREATININE: 4.31 mg/dL — AB (ref 0.44–1.00)
CREATININE: 4.71 mg/dL — AB (ref 0.44–1.00)
CREATININE: 4.72 mg/dL — AB (ref 0.44–1.00)
CREATININE: 4.87 mg/dL — AB (ref 0.44–1.00)
Calcium: 7 mg/dL — ABNORMAL LOW (ref 8.9–10.3)
Calcium: 6.8 mg/dL — ABNORMAL LOW (ref 8.9–10.3)
Calcium: 6.6 mg/dL — ABNORMAL LOW (ref 8.9–10.3)
Calcium: 6.3 mg/dL — CL (ref 8.9–10.3)
Calcium: 6.7 mg/dL — ABNORMAL LOW (ref 8.9–10.3)
Calcium: 6 mg/dL — CL (ref 8.9–10.3)
Chloride: 95 mmol/L — ABNORMAL LOW (ref 98–111)
Chloride: 110 mmol/L (ref 98–111)
Creatinine, Ser: 4.36 mg/dL — ABNORMAL HIGH (ref 0.44–1.00)
Creatinine, Ser: 3.98 mg/dL — ABNORMAL HIGH (ref 0.44–1.00)
GFR calc Af Amer: 11 mL/min — ABNORMAL LOW (ref >60)
GFR calc Af Amer: 14 mL/min — ABNORMAL LOW (ref >60)
GFR calc non Af Amer: 9 mL/min — ABNORMAL LOW (ref >60)
GFR calc non Af Amer: 10 mL/min — ABNORMAL LOW (ref >60)
GFR calc non Af Amer: 10 mL/min — ABNORMAL LOW (ref >60)
GFR calc non Af Amer: 11 mL/min — ABNORMAL LOW (ref >60)
GFR calc non Af Amer: 11 mL/min — ABNORMAL LOW (ref >60)
GFR calc non Af Amer: 12 mL/min — ABNORMAL LOW (ref >60)
GFR, EST AFRICAN AMERICAN: 11 mL/min — AB (ref >60)
GFR, EST AFRICAN AMERICAN: 11 mL/min — AB (ref >60)
GFR, EST AFRICAN AMERICAN: 13 mL/min — AB (ref >60)
GFR, EST AFRICAN AMERICAN: 13 mL/min — AB (ref >60)
GLUCOSE: 120 mg/dL — AB (ref 70–99)
Glucose, Bld: 1033 mg/dL (ref 70–99)
Glucose, Bld: 936 mg/dL (ref 70–99)
Glucose, Bld: 717 mg/dL (ref 70–99)
Glucose, Bld: 484 mg/dL — ABNORMAL HIGH (ref 70–99)
Glucose, Bld: 267 mg/dL — ABNORMAL HIGH (ref 70–99)
POTASSIUM: 4.2 mmol/L (ref 3.5–5.1)
POTASSIUM: 3.3 mmol/L — AB (ref 3.5–5.1)
Potassium: 7 mmol/L (ref 3.5–5.1)
Potassium: 6.6 mmol/L (ref 3.5–5.1)
Potassium: 5.2 mmol/L — ABNORMAL HIGH (ref 3.5–5.1)
Potassium: 4.4 mmol/L (ref 3.5–5.1)
SODIUM: 126 mmol/L — AB (ref 135–145)
Sodium: 130 mmol/L — ABNORMAL LOW (ref 135–145)
Sodium: 135 mmol/L (ref 135–145)
Sodium: 137 mmol/L (ref 135–145)
Sodium: 140 mmol/L (ref 135–145)
Sodium: 137 mmol/L (ref 135–145)

## 2018-07-08 LAB — GLUCOSE, CAPILLARY
GLUCOSE-CAPILLARY: 589 mg/dL — AB (ref 70–99)
GLUCOSE-CAPILLARY: 459 mg/dL — AB (ref 70–99)
GLUCOSE-CAPILLARY: 422 mg/dL — AB (ref 70–99)
GLUCOSE-CAPILLARY: 281 mg/dL — AB (ref 70–99)
GLUCOSE-CAPILLARY: 220 mg/dL — AB (ref 70–99)
GLUCOSE-CAPILLARY: 197 mg/dL — AB (ref 70–99)
GLUCOSE-CAPILLARY: 134 mg/dL — AB (ref 70–99)
GLUCOSE-CAPILLARY: 98 mg/dL (ref 70–99)
GLUCOSE-CAPILLARY: 100 mg/dL — AB (ref 70–99)
Glucose-Capillary: >600 mg/dL (ref 70–99)
Glucose-Capillary: 535 mg/dL (ref 70–99)
Glucose-Capillary: 493 mg/dL — ABNORMAL HIGH (ref 70–99)
Glucose-Capillary: 335 mg/dL — ABNORMAL HIGH (ref 70–99)
Glucose-Capillary: 176 mg/dL — ABNORMAL HIGH (ref 70–99)
Glucose-Capillary: 93 mg/dL (ref 70–99)

## 2018-07-08 LAB — CBC WITH DIFFERENTIAL/PLATELET
ABS IMMATURE GRANULOCYTES: 0.2 10*3/uL — AB (ref 0.0–0.1)
BASOS ABS: 0.1 10*3/uL (ref 0.0–0.1)
Basophils Relative: 0 %
EOS PCT: 0 %
Eosinophils Absolute: 0 10*3/uL (ref 0.0–0.7)
HCT: 32.5 % — ABNORMAL LOW (ref 36.0–46.0)
HEMOGLOBIN: 9 g/dL — AB (ref 12.0–15.0)
Immature Granulocytes: 2 %
LYMPHS PCT: 3 %
Lymphs Abs: 0.5 10*3/uL — ABNORMAL LOW (ref 0.7–4.0)
MCH: 29.1 pg (ref 26.0–34.0)
MCHC: 27.7 g/dL — AB (ref 30.0–36.0)
MCV: 105.2 fL — ABNORMAL HIGH (ref 78.0–100.0)
Monocytes Absolute: 1.5 10*3/uL — ABNORMAL HIGH (ref 0.1–1.0)
Monocytes Relative: 10 %
NEUTROS ABS: 12.4 10*3/uL — AB (ref 1.7–7.7)
NEUTROS PCT: 85 %
Platelets: 264 10*3/uL (ref 150–400)
RBC: 3.09 MIL/uL — AB (ref 3.87–5.11)
RDW: 15.2 % (ref 11.5–15.5)
WBC: 14.6 10*3/uL — AB (ref 4.0–10.5)

## 2018-07-08 LAB — CBG MONITORING, ED
Glucose-Capillary: 556 mg/dL (ref 70–99)
Glucose-Capillary: >600 mg/dL (ref 70–99)

## 2018-07-08 LAB — URINALYSIS, ROUTINE W REFLEX MICROSCOPIC
BILIRUBIN URINE: NEGATIVE
Bacteria, UA: NONE SEEN
Glucose, UA: >=500 mg/dL — AB
KETONES UR: 20 mg/dL — AB
LEUKOCYTES UA: NEGATIVE
NITRITE: NEGATIVE
PROTEIN: 100 mg/dL — AB
Specific Gravity, Urine: 1.013 (ref 1.005–1.030)
pH: 5 (ref 5.0–8.0)

## 2018-07-08 LAB — HIV ANTIBODY (ROUTINE TESTING W REFLEX): HIV SCREEN 4TH GENERATION: NONREACTIVE

## 2018-07-08 LAB — LACTIC ACID, PLASMA: Lactic Acid, Venous: 1.7 mmol/L (ref 0.5–1.9)

## 2018-07-08 LAB — PROCALCITONIN: Procalcitonin: 0.28 ng/mL

## 2018-07-08 LAB — CREATININE, URINE, RANDOM: Creatinine, Urine: 38.79 mg/dL

## 2018-07-08 LAB — BETA-HYDROXYBUTYRIC ACID

## 2018-07-08 LAB — SODIUM, URINE, RANDOM: Sodium, Ur: 43 mmol/L

## 2018-07-08 LAB — MRSA PCR SCREENING: MRSA BY PCR: NEGATIVE

## 2018-07-08 MED ORDER — AMLODIPINE BESYLATE 5 MG PO TABS
10.00 | ORAL_TABLET | ORAL | Status: DC
Start: 2018-07-07 — End: 2018-07-08

## 2018-07-08 MED ORDER — SODIUM CHLORIDE 0.9 % IV SOLN
INTRAVENOUS | Status: DC
  Administered 2018-07-08 (×2): via INTRAVENOUS

## 2018-07-08 MED ORDER — GLUCOSE 40 % PO GEL
15.00 | ORAL | Status: DC
Start: ? — End: 2018-07-08

## 2018-07-08 MED ORDER — CHLORHEXIDINE GLUCONATE 0.12 % MT SOLN
15.0000 mL | Freq: Two times a day (BID) | OROMUCOSAL | Status: DC
  Administered 2018-07-08 – 2018-07-09 (×3): 15 mL via OROMUCOSAL
  Filled 2018-07-08: qty 15

## 2018-07-08 MED ORDER — NICOTINE 14 MG/24HR TD PT24
1.00 | MEDICATED_PATCH | TRANSDERMAL | Status: DC
Start: 2018-07-06 — End: 2018-07-08

## 2018-07-08 MED ORDER — SODIUM CHLORIDE 0.9 % IV BOLUS
1000.0000 mL | Freq: Once | INTRAVENOUS | Status: AC
Start: 2018-07-08 — End: 2018-07-08
  Administered 2018-07-08: 1000 mL via INTRAVENOUS

## 2018-07-08 MED ORDER — INSULIN LISPRO 100 UNIT/ML ~~LOC~~ SOLN
.00 | SUBCUTANEOUS | Status: DC
Start: 2018-07-06 — End: 2018-07-08

## 2018-07-08 MED ORDER — SODIUM CHLORIDE 0.9 % IV SOLN
1.0000 g | Freq: Once | INTRAVENOUS | Status: AC
  Administered 2018-07-08: 1 g via INTRAVENOUS
  Filled 2018-07-08: qty 10

## 2018-07-08 MED ORDER — ATORVASTATIN CALCIUM 10 MG PO TABS
20.00 | ORAL_TABLET | ORAL | Status: DC
Start: 2018-07-06 — End: 2018-07-08

## 2018-07-08 MED ORDER — SODIUM CHLORIDE 0.9 % IV BOLUS
1000.0000 mL | Freq: Once | INTRAVENOUS | Status: AC
  Administered 2018-07-08: 1000 mL via INTRAVENOUS

## 2018-07-08 MED ORDER — HEPARIN SODIUM (PORCINE) 5000 UNIT/ML IJ SOLN
5000.0000 [IU] | Freq: Three times a day (TID) | INTRAMUSCULAR | Status: DC
  Administered 2018-07-08 – 2018-07-11 (×11): 5000 [IU] via SUBCUTANEOUS
  Filled 2018-07-08 (×11): qty 1

## 2018-07-08 MED ORDER — INSULIN LISPRO 100 UNIT/ML ~~LOC~~ SOLN
3.00 | SUBCUTANEOUS | Status: DC
Start: 2018-07-06 — End: 2018-07-08

## 2018-07-08 MED ORDER — POTASSIUM CHLORIDE 10 MEQ/50ML IV SOLN
10.0000 meq | INTRAVENOUS | Status: AC
  Administered 2018-07-08 (×2): 10 meq via INTRAVENOUS
  Filled 2018-07-08 (×2): qty 50

## 2018-07-08 MED ORDER — DEXTROSE-NACL 5-0.45 % IV SOLN
INTRAVENOUS | Status: DC
  Administered 2018-07-08 – 2018-07-09 (×3): via INTRAVENOUS

## 2018-07-08 MED ORDER — STERILE WATER FOR INJECTION IV SOLN
INTRAVENOUS | Status: DC
  Administered 2018-07-08: 06:00:00 via INTRAVENOUS
  Filled 2018-07-08 (×2): qty 850

## 2018-07-08 MED ORDER — HEPARIN SODIUM (PORCINE) PF 5000 UNIT/0.5ML IJ SOLN
5000.00 | INTRAMUSCULAR | Status: DC
Start: 2018-07-06 — End: 2018-07-08

## 2018-07-08 MED ORDER — ORAL CARE MOUTH RINSE
15.0000 mL | Freq: Two times a day (BID) | OROMUCOSAL | Status: DC
  Administered 2018-07-08 – 2018-07-09 (×3): 15 mL via OROMUCOSAL

## 2018-07-08 MED ORDER — INSULIN LISPRO 100 UNIT/ML ~~LOC~~ SOLN
3.00 | SUBCUTANEOUS | Status: DC
Start: 2018-07-07 — End: 2018-07-08

## 2018-07-08 MED ORDER — SODIUM CHLORIDE 0.9 % IV SOLN
INTRAVENOUS | Status: DC
  Administered 2018-07-08: 1.3 [IU]/h via INTRAVENOUS
  Administered 2018-07-08: 5.4 [IU]/h via INTRAVENOUS
  Administered 2018-07-08: 4.8 [IU]/h via INTRAVENOUS
  Filled 2018-07-08 (×3): qty 1

## 2018-07-08 MED ORDER — GENERIC EXTERNAL MEDICATION
1.00 | Status: DC
Start: ? — End: 2018-07-08

## 2018-07-08 MED ORDER — MELATONIN 1 MG PO TABS
1.00 | ORAL_TABLET | ORAL | Status: DC
Start: 2018-07-06 — End: 2018-07-08

## 2018-07-08 MED ORDER — CHOLECALCIFEROL 25 MCG (1000 UT) PO TABS
2000.00 | ORAL_TABLET | ORAL | Status: DC
Start: 2018-07-07 — End: 2018-07-08

## 2018-07-08 MED ORDER — SODIUM BICARBONATE 8.4 % IV SOLN
50.0000 meq | Freq: Once | INTRAVENOUS | Status: AC
  Administered 2018-07-08: 50 meq via INTRAVENOUS
  Filled 2018-07-08: qty 50

## 2018-07-08 MED ORDER — DEXTROSE 50 % IV SOLN
12.50 | INTRAVENOUS | Status: DC
Start: ? — End: 2018-07-08

## 2018-07-08 MED ORDER — CHOLESTYRAMINE 4 G PO PACK
4.0000 g | PACK | Freq: Three times a day (TID) | ORAL | Status: AC
  Administered 2018-07-08 – 2018-07-09 (×4): 4 g via ORAL
  Filled 2018-07-08 (×4): qty 1

## 2018-07-08 MED ORDER — INSULIN ASPART 100 UNIT/ML ~~LOC~~ SOLN
10.0000 [IU] | Freq: Once | SUBCUTANEOUS | Status: AC
  Administered 2018-07-08: 10 [IU] via INTRAVENOUS

## 2018-07-08 MED ORDER — PHENOL 1.4 % MT LIQD
OROMUCOSAL | Status: DC
Start: ? — End: 2018-07-08

## 2018-07-08 MED ORDER — SODIUM BICARBONATE 8.4 % IV SOLN
50.0000 meq | Freq: Once | INTRAVENOUS | Status: AC
  Administered 2018-07-08: 50 meq via INTRAVENOUS

## 2018-07-08 MED ORDER — ACETAMINOPHEN 325 MG PO TABS
650.00 | ORAL_TABLET | ORAL | Status: DC
Start: ? — End: 2018-07-08

## 2018-07-08 MED ORDER — INSULIN GLARGINE 100 UNIT/ML ~~LOC~~ SOLN
11.00 | SUBCUTANEOUS | Status: DC
Start: 2018-07-06 — End: 2018-07-08

## 2018-07-08 NOTE — ED Notes (Signed)
The pt temp has increased  bair hugger has been removed

## 2018-07-08 NOTE — ED Notes (Signed)
Pt CBG was 556, notified Chris(RN)

## 2018-07-08 NOTE — Progress Notes (Signed)
Missed call from pts mom. Attempted to return call, did not reach pts mom, unable to leave vm.

## 2018-07-08 NOTE — ED Notes (Signed)
Pt CBG was HI, notified Candy(RN)

## 2018-07-08 NOTE — ED Notes (Signed)
Unable to give report receiving rn in a contact room

## 2018-07-08 NOTE — ED Notes (Signed)
Report given to rn chris

## 2018-07-08 NOTE — ED Notes (Signed)
I stat venous blood gas is missing some values. Bad sample. Needs to be redrawn. RN Gerald Stabs informed bad sample.

## 2018-07-08 NOTE — ED Triage Notes (Signed)
Pt here via EMS with c/o "just not feeling right". Pt was just discharged from [facility] (yesterday) for tx of blood sugar greater than 1300. EMS glucometer read high. States that she is staying with her cousin. C/o stress that daughter just moved to [place] for school.Pt A&O x4. 256ml bolus with EMS.

## 2018-07-08 NOTE — Progress Notes (Signed)
PULMONARY / CRITICAL CARE MEDICINE   Name: Alyssa Floyd MRN: [id number] DOB: 1966/10/04    ADMISSION DATE:  07/07/2018 CONSULTATION DATE:  07/08/2018  REFERRING MD:  Baird Cancer, ED PA    CHIEF COMPLAINT:  [name]  HISTORY OF PRESENT ILLNESS:   52 year old female with Type 1 DM, CKD Stage 4, HTN, Right Breast Cancer s/p Right Partial Mastectomy 10/2016 followed by ChemoXRT, Anemia, HLD  Recent Admission 8/2-8/9 at [facility] for [name]. Believed to be secondary to patient taking Insulin incorrectly. Noted that patient has been depressed recently due to daughter leaving for college.   Presented to ED on 8/11 with hyperglycemia. Patient states that she has been taking her medications as prescribed, however was unable to pick up new medication yet. Has been staying with cousin since discharge. Denies Fever, Chills, Weakness. VBG pH 7.03, K 7.0, Glucose 1033. Given 3L NS and started on insulin gtt. PCCM asked to admit.     SUBJECTIVE:  Awake alert no acute distress VITAL SIGNS: BP 138/60   Pulse 82   Temp (!) 97.4 F (36.3 C) (Oral)   Resp (!) 27   Ht 5\' 5"  (1.651 m)   Wt 76.6 kg   SpO2 99%   BMI 28.10 kg/m   HEMODYNAMICS:    VENTILATOR SETTINGS:    INTAKE / OUTPUT: I/O last 3 completed shifts: In: 3979.8 [I.V.:2870.1; IV Piggyback:1109.7] Out: 350 [Urine:350]  PHYSICAL EXAMINATION: General: Obese female awake alert no acute distress HEENT: Dry oral mucosa, no JVD is appreciated. Neuro: Awake alert moves all extremities has a depressed affect CV: Sounds are regular PULM: even/non-labored, lungs bilaterally decreased in bases Port-A-Cath noted HU:DJSH, non-tender, bsx4 active  Extremities: warm/dry, 1+ edema  Skin: no rashes or lesions   LABS:  BMET Recent Labs  Lab 07/08/18 0013 07/08/18 0229 07/08/18 0650  NA 126* 130* 135  K 7.0* 6.6* 5.2*  CL 95* 101 105  CO2 <7* <7* <7*  BUN 57* 55* 54*  CREATININE 4.87* 4.71* 4.72*  GLUCOSE 1,033* 936* 717*     Electrolytes Recent Labs  Lab 07/08/18 0013 07/08/18 0229 07/08/18 0650  CALCIUM 7.0* 6.8* 6.6*    CBC Recent Labs  Lab 07/08/18 0013  WBC 14.6*  HGB 9.0*  HCT 32.5*  PLT 264    Coag's No results for input(s): APTT, INR in the last 168 hours.  Sepsis Markers Recent Labs  Lab 07/08/18 0229  LATICACIDVEN 1.7  PROCALCITON 0.28    ABG Recent Labs  Lab 07/08/18 0450  PHART 7.081*  PCO2ART PENDING  PO2ART 156*    Liver Enzymes No results for input(s): AST, ALT, ALKPHOS, BILITOT, ALBUMIN in the last 168 hours.  Cardiac Enzymes No results for input(s): TROPONINI, PROBNP in the last 168 hours.  Glucose Recent Labs  Lab 07/08/18 0011 07/08/18 0337 07/08/18 0442 07/08/18 0547 07/08/18 0629 07/08/18 0734  GLUCAP >600* >600* >600* 556* >600* 589*    Imaging Dg Chest Port 1 View  Result Date: 07/08/2018 CLINICAL DATA:  Diabetic ketoacidosis. EXAM: PORTABLE CHEST 1 VIEW COMPARISON:  None. FINDINGS: The lungs are well-aerated. Pulmonary vascularity is at the upper limits of normal. There is no evidence of focal opacification, pleural effusion or pneumothorax. The cardiomediastinal silhouette is borderline normal in size. A left-sided chest port is noted ending about the distal SVC. No acute osseous abnormalities are seen. Clips are seen at the right axilla. IMPRESSION: No acute cardiopulmonary process seen. Electronically Signed   By: Garald Balding M.D.   On: 07/08/2018  03:01     STUDIES:  CXR 8/11 > no acute   CULTURES: Blood 8/11 >> U/A 8/11 >>  ANTIBIOTICS: None.   SIGNIFICANT EVENTS: 8/11 > Presents to ED   LINES/TUBES: PIV Left Chest Port   DISCUSSION: 52 year old female with Type 1 DM Presents to ED in [name].  Started on bicarbonate drip insulin drip [name] protocol.  Is awake alert no acute distress 07/08/2018 0900 hrs.  ASSESSMENT / PLAN:  Tachypnea in setting of [name]  P:   O2 as needed Pulmonary toilet No acute respiratory distress  date 09/2018  H/O HTN, HLD  P:  Cardiac monitoring On antihypertensives  Anion Gap Metabolic Acidosis  Acute on Chronic Kidney Disease with Nephrotic Syndrome (Noted Crt Baseline 2.6-3.2 per [facility]) Lab Results  Component Value Date   CREATININE 4.72 (H) 07/08/2018   CREATININE 4.71 (H) 07/08/2018   CREATININE 4.87 (H) 07/08/2018  Fluid resuscitation per protocol Monitor creatinine   Pseudohyperkalemia  Recent Labs  Lab 07/08/18 0013 07/08/18 0229 07/08/18 0650  K 7.0* 6.6* 5.2*    P:   Trend potassium DC bicarbonate drip  Anemia of Chronic Disease Recent Labs    07/08/18 0013  HGB 9.0*   -Baseline Hbg 7-8  Right Breast Cancer s/p Partial Mastectomy -10/2016, Followed by ChemoXRT ending on 06/2017, Followed by [facility]  P:  Follow CBC Subcu  Leucocytosis (Likely Reactive)  P:   Trend white count fever curve No antibiotics at this time   [name] CBG (last 3)  Recent Labs    07/08/18 0547 07/08/18 0629 07/08/18 0734  GLUCAP 556* >600* 589*    H/O Type 1 DM   -Previous HA1C 14  -Beta-H > 8  P:   [name] protocol    FAMILY  - Updates: No family at bedside   - Inter-disciplinary family meet or Palliative Care meeting due by: 07/15/2018    Richardson Landry Mukhtar Shams ACNP Maryanna Shape PCCM Pager [phone] till 1 pm If no answer page [phone] 07/08/2018, 8:53 AM

## 2018-07-08 NOTE — Progress Notes (Addendum)
Received call from lab, pts Ca 6.3. Spoke w/ Dr. Elsworth Soho and relayed

## 2018-07-08 NOTE — ED Notes (Signed)
Phlebotomy aware of need for repeat vbg

## 2018-07-08 NOTE — ED Notes (Signed)
Purewick placed on pt. 

## 2018-07-08 NOTE — Progress Notes (Signed)
Criticial ABG results given to CCM.  Ph 7.08 CO2 10.5 HC03 3.0 P02 156

## 2018-07-08 NOTE — Progress Notes (Signed)
eLink Physician-Brief Progress Note Patient Name: Alyssa Floyd DOB: June 20, 1966 MRN: [id number]   Date of Service  07/08/2018  HPI/Events of Note  Diarrhea - The patient has had 5 loose BM over the course of the day. Patient requests Imodium.   eICU Interventions  Will order: 1. C. Difficile PCR panel.  2. Enteric precautions.  3. Questran 4 gm PO now and TID.      Intervention Category Major Interventions: Other:  Lysle Dingwall 07/08/2018, 10:12 PM

## 2018-07-08 NOTE — ED Notes (Signed)
Critical care at the bedside. 

## 2018-07-08 NOTE — ED Provider Notes (Signed)
[place] EMERGENCY DEPARTMENT Provider Note   CSN: [id number] Arrival date & time: 07/07/18  2359     History   Chief Complaint Chief Complaint  Patient presents with  . Hyperglycemia    HPI Alyssa Floyd is a 52 y.o. female.  The history is provided by the patient and medical records.     52 y.o. F with hx of diarrhea, DM1, neuropathy, HLP, HTN, nephrotic syndrome, vit d deficiency, presenting to the ED for hyperglycemia.  Patient just released from [facility] yesterday after admission for [name].  Patient states at admission her glucose was > 1300.  Reports since being released she has been taking her medications that she has at home as prescribed, however was unable to pick up her new medications today as pharmacy closed at 6 PM.  Reports she is currently staying with her cousin and has been feeling down because her daughter recently moved away to [place].  States she left for college and this is the first time she has been away from home like this.  States she feels like she probably has not been taking care of herself as much as she should.  She denies any chest pain or shortness of breath.  Does report excessive thirst and frequent urination.  Denies urinary symptoms.  No fevers.    Past Medical History:  Diagnosis Date  . 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 Diabetes mellitus ([place])   . Diabetic nephropathy ([place])   . Hyperlipidemia   . Hypertension   . Nephrotic syndrome    diabetic nephropathy, biopsy on 01/06/16 at [facility]  . Normocytic anemia   . Subclinical hypothyroidism   . Tobacco use   . Vitamin D deficiency     Patient Active Problem List   Diagnosis Date Noted  . Dehydration   . Dysarthria   . HHNC (hyperglycemic hyperosmolar nonketotic coma) ([place])   . Acute encephalopathy   . Acute renal failure (ARF) ([place]) 01/29/2016  . [name], type 1 ([place]) 01/29/2016  . Prolonged Q-T interval on ECG 01/29/2016  .  Hyperlipidemia 01/29/2016  . Hypertension 01/29/2016  . Chronic diarrhea 01/29/2016  . Nephrotic syndrome 01/29/2016  . Tobacco use 01/29/2016  . 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 Normocytic anemia 01/29/2016  . Subclinical hypothyroidism 01/29/2016  . Encephalopathy acute     Past Surgical History:  Procedure Laterality Date  . PARTIAL HYSTERECTOMY       OB History   None      Home Medications    Prior to Admission medications   Medication Sig Start Date End Date Taking? Authorizing Provider  atorvastatin (LIPITOR) 40 MG tablet Take 20 mg by mouth daily. 01/18/16   [provider]  ferrous sulfate 325 (65 FE) MG tablet Take 325 mg by mouth daily with breakfast.    [provider]  insulin aspart (NOVOLOG) 100 UNIT/ML injection Inject 5 Units into the skin 3 (three) times daily with meals. 01/31/16   Norman Herrlich, MD  insulin glargine (LANTUS) 100 UNIT/ML injection Inject 0.15 mLs (15 Units total) into the skin daily. 01/31/16   Norman Herrlich, MD  lisinopril (PRINIVIL,ZESTRIL) 40 MG tablet Take 80 mg by mouth daily. 01/18/16 01/17/17  [provider]  magnesium oxide (MAG-OX) 400 MG tablet Take 400 mg by mouth 2 (two) times daily. 01/18/16   [provider]  Multiple Vitamin (MULTIVITAMIN WITH MINERALS) TABS tablet Take 1 tablet by mouth daily.  [provider]  potassium chloride SA (K-DUR,KLOR-CON) 20 MEQ tablet Take 20 mEq by mouth daily. 01/12/16   [provider]  Protein POWD 25 g by Does not apply route 2 (two) times daily. Patient not taking: Reported on 01/29/2016 12/20/15   Harvest Dark, MD  torsemide (DEMADEX) 100 MG tablet Take 50 mg by mouth 2 (two) times daily. 01/12/16   [provider]  Vitamin D, Ergocalciferol, (DRISDOL) 50000 units CAPS capsule Take 50,000 Units by mouth once a week. 01/26/16   [provider]  ZENPEP 5000 units CPEP Take 10,000 Units by mouth 3 (three) times daily with meals.  01/13/16   [provider]    Family History Family History  Problem Relation Age of Onset  . Diabetes Unknown     Social History Social History   Tobacco Use  . Smoking status: Current Every Day Smoker    Packs/day: 0.50    Types: Cigarettes  . Smokeless tobacco: Never Used  Substance Use Topics  . Alcohol use: No  . Drug use: No     Allergies   Penicillins   Review of Systems Review of Systems  Endocrine: Positive for polydipsia and polyuria.       Hyperglycemia  All other systems reviewed and are negative.    Physical Exam Updated Vital Signs BP 136/69 (BP Location: Left Arm)   Pulse 87   Resp (!) 24   SpO2 100%   Physical Exam  Constitutional: She is oriented to person, place, and time. She appears well-developed and well-nourished.  Appears ill  HENT:  Head: Normocephalic and atraumatic.  Mouth/Throat: Oropharynx is clear and moist.  Dry mucous membranes  Eyes: Pupils are equal, round, and reactive to light. Conjunctivae and EOM are normal.  Neck: Normal range of motion.  Cardiovascular: Normal rate, regular rhythm and normal heart sounds.  Pulmonary/Chest: Effort normal and breath sounds normal. She has no wheezes. She has no rhonchi.  Kussmaul respirations  Abdominal: Soft. Bowel sounds are normal.  Musculoskeletal: Normal range of motion.  Neurological: She is alert and oriented to person, place, and time.  Awake, alert, oriented, able to answer questions and follow commands appropriately  Skin: Skin is warm and dry.  Psychiatric: She has a normal mood and affect.  Nursing note and vitals reviewed.    ED Treatments / Results  Labs (all labs ordered are listed, but only abnormal results are displayed) Labs Reviewed  CBC WITH DIFFERENTIAL/PLATELET - Abnormal; Notable for the following components:      Result Value   WBC 14.6 (*)    RBC 3.09 (*)    Hemoglobin 9.0 (*)    HCT 32.5 (*)    MCV 105.2 (*)    MCHC 27.7 (*)    Neutro  Abs 12.4 (*)    Lymphs Abs 0.5 (*)    Monocytes Absolute 1.5 (*)    Abs Immature Granulocytes 0.2 (*)    All other components within normal limits  BASIC METABOLIC PANEL - Abnormal; Notable for the following components:   Sodium 126 (*)    Potassium 7.0 (*)    Chloride 95 (*)    CO2 <7 (*)    Glucose, Bld 1,033 (*)    BUN 57 (*)    Creatinine, Ser 4.87 (*)    Calcium 7.0 (*)    GFR calc non Af Amer 9 (*)    GFR calc Af Amer 11 (*)    All other components within normal limits  BASIC METABOLIC PANEL - Abnormal; Notable for the following components:   Sodium 130 (*)    Potassium 6.6 (*)    CO2 <7 (*)    Glucose, Bld 936 (*)    BUN 55 (*)    Creatinine, Ser 4.71 (*)    Calcium 6.8 (*)    GFR calc non Af Amer 10 (*)    GFR calc Af Amer 11 (*)    All other components within normal limits  CBG MONITORING, ED - Abnormal; Notable for the following components:   Glucose-Capillary >600 (*)    All other components within normal limits  CBG MONITORING, ED - Abnormal; Notable for the following components:   Glucose-Capillary >600 (*)    All other components within normal limits  CBG MONITORING, ED - Abnormal; Notable for the following components:   Glucose-Capillary >600 (*)    All other components within normal limits  URINE CULTURE  CULTURE, BLOOD (ROUTINE X 2)  CULTURE, BLOOD (ROUTINE X 2)  LACTIC ACID, PLASMA  URINALYSIS, ROUTINE W REFLEX MICROSCOPIC  URINALYSIS, ROUTINE W REFLEX MICROSCOPIC  PROCALCITONIN  BASIC METABOLIC PANEL  BASIC METABOLIC PANEL  BASIC METABOLIC PANEL  BETA-HYDROXYBUTYRIC ACID  BLOOD GAS, ARTERIAL  HIV ANTIBODY (ROUTINE TESTING)  I-STAT VENOUS BLOOD GAS, ED    EKG None  Radiology Dg Chest Port 1 View  Result Date: 07/08/2018 CLINICAL DATA:  Diabetic ketoacidosis. EXAM: PORTABLE CHEST 1 VIEW COMPARISON:  None. FINDINGS: The lungs are well-aerated. Pulmonary vascularity is at the upper limits of normal. There is no evidence of focal opacification,  pleural effusion or pneumothorax. The cardiomediastinal silhouette is borderline normal in size. A left-sided chest port is noted ending about the distal SVC. No acute osseous abnormalities are seen. Clips are seen at the right axilla. IMPRESSION: No acute cardiopulmonary process seen. Electronically Signed   By: Garald Balding M.D.   On: 07/08/2018 03:01    Procedures Procedures (including critical care time)  CRITICAL CARE Performed by: Larene Pickett   Total critical care time: 45 minutes  Critical care time was exclusive of separately billable procedures and treating other patients.  Critical care was necessary to treat or prevent imminent or life-threatening deterioration.  Critical care was time spent personally by me on the following activities: development of treatment plan with patient and/or surrogate as well as nursing, discussions with consultants, evaluation of patient's response to treatment, examination of patient, obtaining history from patient or surrogate, ordering and performing treatments and interventions, ordering and review of laboratory studies, ordering and review of radiographic studies, pulse oximetry and re-evaluation of patient's condition.   Medications Ordered in ED Medications  dextrose 5 %-0.45 % sodium chloride infusion (has no administration in time range)  insulin regular (NOVOLIN R,HUMULIN R) 100 Units in sodium chloride 0.9 % 100 mL (1 Units/mL) infusion (16.2 Units/hr Intravenous Rate/Dose Change 07/08/18 0443)  sodium chloride 0.9 % bolus 1,000 mL (0 mLs Intravenous Stopped 07/08/18 0326)    And  0.9 %  sodium chloride infusion ( Intravenous New Bag/Given 07/08/18 0326)  sodium bicarbonate injection 50 mEq (has no administration in time range)  sodium bicarbonate 150 mEq in sterile water 1,000 mL infusion (has no administration in time range)  heparin injection 5,000 Units (has no administration in time range)  sodium chloride 0.9 % bolus 1,000 mL (0  mLs Intravenous Stopped 07/08/18 0144)  calcium gluconate 1 g in sodium chloride 0.9 % 100 mL IVPB (0 g Intravenous Stopped 07/08/18 0302)  sodium chloride  0.9 % bolus 1,000 mL (1,000 mLs Intravenous New Bag/Given 07/08/18 0257)  insulin aspart (novoLOG) injection 10 Units (10 Units Intravenous Given 07/08/18 0244)  sodium bicarbonate injection 50 mEq (50 mEq Intravenous Given 07/08/18 0250)     Initial Impression / Assessment and Plan / ED Course  I have reviewed the triage vital signs and the nursing notes.  Pertinent labs & imaging results that were available during my care of the patient were reviewed by me and considered in my medical decision making (see chart for details).  52 y.o. F here with hyperglycemia.  Left [facility] yesterday after admission for [name].  On my initial evaluation, appears to be back in [name].  CBG reading HIGH.  She does appear clinically dry with kussmaul respirations.  She denies any chest pain, shortness of breath.  No recent illness.  Labs consistent with [name].  Potassium is 7.0 without hemolysis noted.  Does have some peaked T waves on EKG.  Insulin drip ordered, also given calcium gluconate for correction of hyperkalemia.  PH 7.030 on VBG but with some abnormalities, suspect pH is actually lower than this.  Will try ABG.  Discussed with critical care, Dr. Jimmy Footman-- will send someone to ED to evaluate patient.  Critical care team has evaluated in the ED-- will admit for ongoing care.  Final Clinical Impressions(s) / ED Diagnoses   Final diagnoses:  Type 1 diabetes mellitus with ketoacidosis without coma [facility])  Hyperkalemia    ED Discharge Orders    None       Larene Pickett, PA-C 07/08/18 [place], MD 07/08/18 [phone]

## 2018-07-08 NOTE — H&P (Addendum)
PULMONARY / CRITICAL CARE MEDICINE   Name: Alyssa Floyd MRN: [id number] DOB: 08-24-1966    ADMISSION DATE:  07/07/2018 CONSULTATION DATE:  07/08/2018  REFERRING MD:  Baird Cancer, ED PA    CHIEF COMPLAINT:  [name]  HISTORY OF PRESENT ILLNESS:   52 year old female with Type 1 DM, CKD Stage 4, HTN, Right Breast Cancer s/p Right Partial Mastectomy 10/2016 followed by ChemoXRT, Anemia, HLD  Recent Admission 8/2-8/9 at [facility] for [name]. Believed to be secondary to patient taking Insulin incorrectly. Noted that patient has been depressed recently due to daughter leaving for college.   Presented to ED on 8/11 with hyperglycemia. Patient states that she has been taking her medications as prescribed, however was unable to pick up new medication yet. Has been staying with cousin since discharge. Denies Fever, Chills, Weakness. VBG pH 7.03, K 7.0, Glucose 1033. Given 3L NS and started on insulin gtt. PCCM asked to admit.   PAST MEDICAL HISTORY :  She  has a past medical history of Chronic diarrhea, Diabetes mellitus ([place]), Diabetic nephropathy ([place]), Hyperlipidemia, Hypertension, Nephrotic syndrome, Normocytic anemia, Subclinical hypothyroidism, Tobacco use, and Vitamin D deficiency.  PAST SURGICAL HISTORY: She  has a past surgical history that includes Partial hysterectomy.  Allergies  Allergen Reactions  . Penicillins Other (See Comments)    Pt reports hallucinations when taking penicillins. >Has patient had a PCN reaction causing immediate rash, facial/tongue/throat swelling, SOB or lightheadedness with hypotension: No Has patient had a PCN reaction causing severe rash involving mucus membranes or skin necrosis: No Has patient had a PCN reaction that required hospitalization No Has patient had a PCN reaction occurring within the last 10 years: No If all of the above answers are "NO", then may proceed with Cephalosporin use.    No current facility-administered medications on file prior to encounter.     Current Outpatient Medications on File Prior to Encounter  Medication Sig  . atorvastatin (LIPITOR) 40 MG tablet Take 20 mg by mouth daily.  . ferrous sulfate 325 (65 FE) MG tablet Take 325 mg by mouth daily with breakfast.  . insulin aspart (NOVOLOG) 100 UNIT/ML injection Inject 5 Units into the skin 3 (three) times daily with meals.  . insulin glargine (LANTUS) 100 UNIT/ML injection Inject 0.15 mLs (15 Units total) into the skin daily.  Marland Kitchen lisinopril (PRINIVIL,ZESTRIL) 40 MG tablet Take 80 mg by mouth daily.  . magnesium oxide (MAG-OX) 400 MG tablet Take 400 mg by mouth 2 (two) times daily.  . Multiple Vitamin (MULTIVITAMIN WITH MINERALS) TABS tablet Take 1 tablet by mouth daily.  . potassium chloride SA (K-DUR,KLOR-CON) 20 MEQ tablet Take 20 mEq by mouth daily.  . Protein POWD 25 g by Does not apply route 2 (two) times daily. (Patient not taking: Reported on 01/29/2016)  . torsemide (DEMADEX) 100 MG tablet Take 50 mg by mouth 2 (two) times daily.  . Vitamin D, Ergocalciferol, (DRISDOL) 50000 units CAPS capsule Take 50,000 Units by mouth once a week.  Marland Kitchen ZENPEP 5000 units CPEP Take 10,000 Units by mouth 3 (three) times daily with meals.    FAMILY HISTORY:  Her family history includes Diabetes in her unknown relative.  SOCIAL HISTORY: She  reports that she has been smoking cigarettes. She has been smoking about 0.50 packs per day. She has never used smokeless tobacco. She reports that she does not drink alcohol or use drugs.  REVIEW OF SYSTEMS:   All negative; except for those that are bolded, which indicate positives.  Constitutional: weight loss, weight gain, night sweats, fevers, chills, fatigue, weakness.  HEENT: headaches, sore throat, sneezing, nasal congestion, post nasal drip, difficulty swallowing, tooth/dental problems, visual complaints, visual changes, ear aches. Neuro: difficulty with speech, weakness, numbness, ataxia. CV:  chest pain, orthopnea, PND, swelling in lower  extremities, dizziness, palpitations, syncope.  Resp: cough, hemoptysis, dyspnea, wheezing. GI: heartburn, indigestion, abdominal pain, nausea, vomiting, diarrhea, constipation, change in bowel habits, loss of appetite, hematemesis, melena, hematochezia.  GU: dysuria, change in color of urine, urgency or frequency, flank pain, hematuria. MSK: joint pain or swelling, decreased range of motion. Psych: change in mood or affect, depression, anxiety, suicidal ideations, homicidal ideations. Skin: rash, itching, bruising.   SUBJECTIVE:   VITAL SIGNS: BP (!) 128/58   Pulse 87   Temp (!) 94.1 F (34.5 C) (Rectal)   Resp (!) 24   SpO2 100%   HEMODYNAMICS:    VENTILATOR SETTINGS:    INTAKE / OUTPUT: No intake/output data recorded.  PHYSICAL EXAMINATION: General:  Adult female, no distress  Neuro:  Alert, oriented, follows commands  HEENT:  Dry MM  Cardiovascular:  RRR, no MRG Lungs:  Tachypnea, Clear breath sounds, no wheeze/crackles  Abdomen:  Non-distended, soft, non-tender  Musculoskeletal:  -edema  Skin:  Warm, dry   LABS:  BMET Recent Labs  Lab 07/08/18 0013 07/08/18 0229  NA 126* 130*  K 7.0* 6.6*  CL 95* 101  CO2 <7* <7*  BUN 57* 55*  CREATININE 4.87* 4.71*  GLUCOSE 1,033* 936*    Electrolytes Recent Labs  Lab 07/08/18 0013 07/08/18 0229  CALCIUM 7.0* 6.8*    CBC Recent Labs  Lab 07/08/18 0013  WBC 14.6*  HGB 9.0*  HCT 32.5*  PLT 264    Coag's No results for input(s): APTT, INR in the last 168 hours.  Sepsis Markers Recent Labs  Lab 07/08/18 0229  LATICACIDVEN 1.7    ABG No results for input(s): PHART, PCO2ART, PO2ART in the last 168 hours.  Liver Enzymes No results for input(s): AST, ALT, ALKPHOS, BILITOT, ALBUMIN in the last 168 hours.  Cardiac Enzymes No results for input(s): TROPONINI, PROBNP in the last 168 hours.  Glucose Recent Labs  Lab 07/08/18 0011 07/08/18 0337  GLUCAP >600* >600*    Imaging Dg Chest Port 1  View  Result Date: 07/08/2018 CLINICAL DATA:  Diabetic ketoacidosis. EXAM: PORTABLE CHEST 1 VIEW COMPARISON:  None. FINDINGS: The lungs are well-aerated. Pulmonary vascularity is at the upper limits of normal. There is no evidence of focal opacification, pleural effusion or pneumothorax. The cardiomediastinal silhouette is borderline normal in size. A left-sided chest port is noted ending about the distal SVC. No acute osseous abnormalities are seen. Clips are seen at the right axilla. IMPRESSION: No acute cardiopulmonary process seen. Electronically Signed   By: Garald Balding M.D.   On: 07/08/2018 03:01     STUDIES:  CXR 8/11 > no acute   CULTURES: Blood 8/11 >> U/A 8/11 >>  ANTIBIOTICS: None.   SIGNIFICANT EVENTS: 8/11 > Presents to ED   LINES/TUBES: PIV Left Chest Port   DISCUSSION: 52 year old female with Type 1 DM Presents to ED in [name].   ASSESSMENT / PLAN:  Tachypnea in setting of [name]  P:   Maintain Saturation >92  Pulmonary Hygiene   H/O HTN, HLD  P:  Cardiac Monitoring  Hold home Torsemide, Lisinopril   Anion Gap Metabolic Acidosis  Acute on Chronic Kidney Disease with Nephrotic Syndrome (Noted Crt Baseline 2.6-3.2 per [facility]  Care Everywhere) -Crt 4.87 > 4.71 >  Pseudohyperkalemia  P:   Trend BMP q4h Monitor Electrolytes  Bicarb Push now, and Start Bicarb gtt @ 125 ml/hr  Anemia of Chronic Disease -Baseline Hbg 7-8  Right Breast Cancer s/p Partial Mastectomy -10/2016, Followed by ChemoXRT ending on 06/2017, Followed by [facility]  P:  Trend CBC  Heparin SQ for VTE   Leucocytosis (Likely Reactive)  P:   Trend WBC and Fever Curve Trend PCT PAN Culture  Monitor off Antibiotics    [name] H/O Type 1 DM   -Previous HA1C 14  -Beta-H > 8  P:   Trend Glucose q1h Insulin gtt   FAMILY  - Updates: No family at bedside   - Inter-disciplinary family meet or Palliative Care meeting due by: 07/15/2018    Hayden Pedro, AGACNP-BC [facility]  Pgr: [phone]  PCCM Pgr: [phone]

## 2018-07-08 NOTE — Progress Notes (Addendum)
Hold ABG draw until Bicarb runs (apporximately 30-45 min) per CCM at bedside.

## 2018-07-08 NOTE — ED Notes (Signed)
bair hugger applied.

## 2018-07-08 NOTE — ED Notes (Signed)
Nurse will draw labs. 

## 2018-07-08 NOTE — ED Notes (Addendum)
Three different samples run for venous blood gas. RN Freida Busman, RN Gerald Stabs and Phlebotomy Santiago Glad drew samples. Venous blood gas still missing some values. <> bad sample. Dr Leonette Monarch informed. Says to hold off getting another sample. Will not cross over. PA Baird Cancer made aware.

## 2018-07-08 NOTE — Progress Notes (Signed)
eLink Physician-Brief Progress Note Patient Name: Alyssa Floyd DOB: January 12, 1966 MRN: [id number]   Date of Service  07/08/2018  HPI/Events of Note  K+ = 3.3, Ca++ = 6.0 and Creatinine = 3.98. K+ has been trending lower d/t insulin IV infusion for [name].   eICU Interventions  Will cautiously replace K+ and Ca++.     Intervention Category Major Interventions: Electrolyte abnormality - evaluation and management  Sommer,Steven Eugene 07/08/2018, 8:03 PM

## 2018-07-08 NOTE — Progress Notes (Signed)
  Critical ABG results called to CCM ([facility])

## 2018-07-08 NOTE — ED Notes (Signed)
Warm blankets applied. Pt state that her meter resulted HIGH at 1700 tonight.

## 2018-07-08 NOTE — Progress Notes (Signed)
CRITICAL VALUE ALERT  Critical Value:  Ca= 6  Date & Time Notied: 07/08/18 @ 1938  Provider Notified: Dr Oletta Darter  Orders Received/Actions taken: orders received

## 2018-07-08 NOTE — ED Notes (Signed)
Delay in lab draw xray at bedside.

## 2018-07-09 ENCOUNTER — Inpatient Hospital Stay (HOSPITAL_COMMUNITY)

## 2018-07-09 DIAGNOSIS — R601 Generalized edema: Secondary | ICD-10-CM

## 2018-07-09 LAB — BLOOD GAS, ARTERIAL
ACID-BASE DEFICIT: 25.7 mmol/L — AB (ref 0.0–2.0)
Acid-base deficit: 16.4 mmol/L — ABNORMAL HIGH (ref 0.0–2.0)
BICARBONATE: 3 mmol/L — AB (ref 20.0–28.0)
BICARBONATE: 9.1 mmol/L — AB (ref 20.0–28.0)
DRAWN BY: 347621
DRAWN BY: 518061
FIO2: 21
O2 CONTENT: 2 L/min
O2 SAT: 98.1 %
O2 Saturation: 97.7 %
PATIENT TEMPERATURE: 98.6
PH ART: 7.288 — AB (ref 7.350–7.450)
Patient temperature: 98.6
pCO2 arterial: 10.5 mmHg — CL (ref 32.0–48.0)
pCO2 arterial: 19.6 mmHg — CL (ref 32.0–48.0)
pH, Arterial: 7.081 — CL (ref 7.350–7.450)
pO2, Arterial: 156 mmHg — ABNORMAL HIGH (ref 83.0–108.0)
pO2, Arterial: 102 mmHg (ref 83.0–108.0)

## 2018-07-09 LAB — BASIC METABOLIC PANEL
ANION GAP: 8 (ref 5–15)
Anion gap: 11 (ref 5–15)
BUN: 48 mg/dL — AB (ref 6–20)
BUN: 45 mg/dL — ABNORMAL HIGH (ref 6–20)
CALCIUM: 6.4 mg/dL — AB (ref 8.9–10.3)
CO2: 17 mmol/L — AB (ref 22–32)
CO2: 18 mmol/L — AB (ref 22–32)
CREATININE: 4.02 mg/dL — AB (ref 0.44–1.00)
Calcium: 6.3 mg/dL — CL (ref 8.9–10.3)
Chloride: 109 mmol/L (ref 98–111)
Chloride: 109 mmol/L (ref 98–111)
Creatinine, Ser: 3.85 mg/dL — ABNORMAL HIGH (ref 0.44–1.00)
GFR calc Af Amer: 14 mL/min — ABNORMAL LOW (ref >60)
GFR calc non Af Amer: 12 mL/min — ABNORMAL LOW (ref >60)
GFR, EST AFRICAN AMERICAN: 15 mL/min — AB (ref >60)
GFR, EST NON AFRICAN AMERICAN: 13 mL/min — AB (ref >60)
GLUCOSE: 204 mg/dL — AB (ref 70–99)
GLUCOSE: 181 mg/dL — AB (ref 70–99)
POTASSIUM: 3.6 mmol/L (ref 3.5–5.1)
Potassium: 3.6 mmol/L (ref 3.5–5.1)
Sodium: 137 mmol/L (ref 135–145)
Sodium: 135 mmol/L (ref 135–145)

## 2018-07-09 LAB — GLUCOSE, CAPILLARY
GLUCOSE-CAPILLARY: 153 mg/dL — AB (ref 70–99)
GLUCOSE-CAPILLARY: 183 mg/dL — AB (ref 70–99)
Glucose-Capillary: 102 mg/dL — ABNORMAL HIGH (ref 70–99)
Glucose-Capillary: 103 mg/dL — ABNORMAL HIGH (ref 70–99)
Glucose-Capillary: 125 mg/dL — ABNORMAL HIGH (ref 70–99)
Glucose-Capillary: 131 mg/dL — ABNORMAL HIGH (ref 70–99)
Glucose-Capillary: 149 mg/dL — ABNORMAL HIGH (ref 70–99)
Glucose-Capillary: 155 mg/dL — ABNORMAL HIGH (ref 70–99)
Glucose-Capillary: 156 mg/dL — ABNORMAL HIGH (ref 70–99)
Glucose-Capillary: 162 mg/dL — ABNORMAL HIGH (ref 70–99)
Glucose-Capillary: 168 mg/dL — ABNORMAL HIGH (ref 70–99)
Glucose-Capillary: 171 mg/dL — ABNORMAL HIGH (ref 70–99)
Glucose-Capillary: 172 mg/dL — ABNORMAL HIGH (ref 70–99)
Glucose-Capillary: 177 mg/dL — ABNORMAL HIGH (ref 70–99)
Glucose-Capillary: 184 mg/dL — ABNORMAL HIGH (ref 70–99)
Glucose-Capillary: 193 mg/dL — ABNORMAL HIGH (ref 70–99)
Glucose-Capillary: 199 mg/dL — ABNORMAL HIGH (ref 70–99)

## 2018-07-09 LAB — C DIFFICILE QUICK SCREEN W PCR REFLEX
C DIFFICILE (CDIFF) TOXIN: NEGATIVE
C DIFFICLE (CDIFF) ANTIGEN: NEGATIVE
C Diff interpretation: NOT DETECTED

## 2018-07-09 LAB — URINE CULTURE: Culture: NO GROWTH

## 2018-07-09 LAB — MAGNESIUM: Magnesium: 1.5 mg/dL — ABNORMAL LOW (ref 1.7–2.4)

## 2018-07-09 LAB — PHOSPHORUS: Phosphorus: 3.9 mg/dL (ref 2.5–4.6)

## 2018-07-09 LAB — PROCALCITONIN: Procalcitonin: 0.9 ng/mL

## 2018-07-09 MED ORDER — INSULIN GLARGINE 100 UNIT/ML ~~LOC~~ SOLN
15.0000 [IU] | Freq: Once | SUBCUTANEOUS | Status: AC
  Administered 2018-07-09: 15 [IU] via SUBCUTANEOUS
  Filled 2018-07-09: qty 0.15

## 2018-07-09 MED ORDER — CALCITRIOL 0.25 MCG PO CAPS
0.5000 ug | ORAL_CAPSULE | Freq: Every day | ORAL | Status: DC
  Administered 2018-07-09 – 2018-07-11 (×3): 0.5 ug via ORAL
  Filled 2018-07-09 (×2): qty 2
  Filled 2018-07-09: qty 1

## 2018-07-09 MED ORDER — SODIUM CHLORIDE 0.9 % IV SOLN
2.0000 g | Freq: Once | INTRAVENOUS | Status: AC
  Administered 2018-07-09: 2 g via INTRAVENOUS
  Filled 2018-07-09: qty 20

## 2018-07-09 MED ORDER — ATORVASTATIN CALCIUM 20 MG PO TABS
20.0000 mg | ORAL_TABLET | Freq: Every day | ORAL | Status: DC
  Administered 2018-07-09 – 2018-07-10 (×2): 20 mg via ORAL
  Filled 2018-07-09 (×2): qty 1

## 2018-07-09 MED ORDER — ORAL CARE MOUTH RINSE
15.0000 mL | Freq: Two times a day (BID) | OROMUCOSAL | Status: DC
  Administered 2018-07-09: 15 mL via OROMUCOSAL

## 2018-07-09 MED ORDER — MAGNESIUM SULFATE 2 GM/50ML IV SOLN
2.0000 g | Freq: Once | INTRAVENOUS | Status: AC
  Administered 2018-07-09: 2 g via INTRAVENOUS
  Filled 2018-07-09: qty 50

## 2018-07-09 MED ORDER — SODIUM CHLORIDE 0.9 % IV SOLN
INTRAVENOUS | Status: DC
  Administered 2018-07-09 (×2): via INTRAVENOUS

## 2018-07-09 MED ORDER — INSULIN ASPART 100 UNIT/ML ~~LOC~~ SOLN: 0.0000 [IU] | Freq: Every day | SUBCUTANEOUS | Status: DC

## 2018-07-09 MED ORDER — LOPERAMIDE HCL 2 MG PO CAPS
2.0000 mg | ORAL_CAPSULE | ORAL | Status: DC | PRN
  Administered 2018-07-09 (×2): 2 mg via ORAL
  Filled 2018-07-09 (×3): qty 1

## 2018-07-09 MED ORDER — INSULIN ASPART 100 UNIT/ML ~~LOC~~ SOLN
3.0000 [IU] | Freq: Three times a day (TID) | SUBCUTANEOUS | Status: DC
  Administered 2018-07-09 – 2018-07-10 (×4): 3 [IU] via SUBCUTANEOUS

## 2018-07-09 MED ORDER — TORSEMIDE 20 MG PO TABS
50.0000 mg | ORAL_TABLET | Freq: Two times a day (BID) | ORAL | Status: DC
  Administered 2018-07-09 – 2018-07-11 (×5): 50 mg via ORAL
  Filled 2018-07-09: qty 3
  Filled 2018-07-09: qty 1
  Filled 2018-07-09 (×2): qty 3
  Filled 2018-07-09: qty 1

## 2018-07-09 MED ORDER — INSULIN GLARGINE 100 UNIT/ML ~~LOC~~ SOLN: 15.0000 [IU] | Freq: Every day | SUBCUTANEOUS | Status: DC

## 2018-07-09 MED ORDER — MELATONIN 3 MG PO TABS
3.0000 mg | ORAL_TABLET | Freq: Every evening | ORAL | Status: DC | PRN
  Administered 2018-07-09 – 2018-07-10 (×2): 3 mg via ORAL
  Filled 2018-07-09 (×3): qty 1

## 2018-07-09 MED ORDER — INSULIN GLARGINE 100 UNIT/ML ~~LOC~~ SOLN
15.0000 [IU] | Freq: Every day | SUBCUTANEOUS | Status: DC
  Administered 2018-07-10: 15 [IU] via SUBCUTANEOUS
  Filled 2018-07-09: qty 0.15

## 2018-07-09 MED ORDER — INSULIN ASPART 100 UNIT/ML ~~LOC~~ SOLN
0.0000 [IU] | Freq: Three times a day (TID) | SUBCUTANEOUS | Status: DC
  Administered 2018-07-09: 1 [IU] via SUBCUTANEOUS
  Administered 2018-07-09: 2 [IU] via SUBCUTANEOUS
  Administered 2018-07-10: 1 [IU] via SUBCUTANEOUS
  Administered 2018-07-10: 2 [IU] via SUBCUTANEOUS

## 2018-07-09 MED ORDER — INSULIN ASPART 100 UNIT/ML ~~LOC~~ SOLN: 5.0000 [IU] | Freq: Three times a day (TID) | SUBCUTANEOUS | Status: DC

## 2018-07-09 NOTE — Progress Notes (Addendum)
[place] TEAM 1 - Stepdown/ICU TEAM  Annahi Short  ERD:[id number] DOB: September 30, 1966 DOA: 07/07/2018 PCP: Patient, No Pcp Per    Brief Narrative:  52yo F  with DM1, CKD Stage 4, HTN, Right Breast Cancer s/p Right Partial Mastectomy 10/2016 followed by Chemo + XRT, Anemia, and HLD w/ an admission 8/2-8/9 at [facility] for [name]. She presented to ED on 8/11 with hyperglycemia and was found to be in [name]. Patient reported she had been taking her medications as prescribed, but was unable to pick up her new meds.   Significant Events: 8/10 admit  8/12 insulin gtt stopped   Subjective: Very emotional about her daughter going away to school in [place].  Denies cp, sob, n/v, or abdom pain.    Assessment & Plan:  [name] - uncontrolled DM1 Anion gap closed, bicarb improved, and CBG controlled - transition off insulin gtt - appears to be a simple compliance issue - resume intended home regimen and follow   HTN Reasonably controlled at this time   HLD Continue home lipitor  Acute on CKD Stage 4 Baseline crt 2.6-3.2 - follow w/ resolution of [name] - no ACEi/ARB   Hypomagnesemia Replace and follow  Hypocalcemia Begin calcitriol - supplement w/ IV Ca gluconate - follow trend   Anemia of CKD No acute blood loss - follow trend   R breast CA s/p Partial mastectomy + Chemo + XRT Followed by [facility]  DVT prophylaxis: SQ heparin  Code Status: FULL CODE Family Communication: no family present at time of exam  Disposition Plan: transfer to tele if [name] does not rapidly recur   Consultants:  PCCM  Antimicrobials:  none   Objective: Blood pressure 131/67, pulse 66, temperature 97.8 F (36.6 C), temperature source Oral, resp. rate 16, height 5\' 5"  (1.651 m), weight 78.7 kg, SpO2 98 %.  Intake/Output Summary (Last 24 hours) at 07/09/2018 0945 Last data filed at 07/09/2018 0900 Gross per 24 hour  Intake 3518.97 ml  Output 894 ml  Net 2624.97 ml   Filed Weights   07/08/18 0645 07/09/18 0500    Weight: 76.6 kg 78.7 kg    Examination: General: No acute respiratory distress Lungs: Clear to auscultation bilaterally without wheezes or crackles Cardiovascular: Regular rate and rhythm without murmur gallop or rub normal S1 and S2 Abdomen: Nontender, nondistended, soft, bowel sounds positive, no rebound, no ascites, no appreciable mass Extremities: 2+ anasarca - no ischemia   CBC: Recent Labs  Lab 07/08/18 0013  WBC 14.6*  NEUTROABS 12.4*  HGB 9.0*  HCT 32.5*  MCV 105.2*  PLT 563   Basic Metabolic Panel: Recent Labs  Lab 07/08/18 1439 07/08/18 1831 07/09/18 0359  NA 140 137 137  K 4.2 3.3* 3.6  CL 109 110 109  CO2 17* 19* 17*  GLUCOSE 267* 120* 204*  BUN 53* 49* 48*  CREATININE 4.36* 3.98* 4.02*  CALCIUM 6.7* 6.0* 6.4*  MG  --   --  1.5*  PHOS  --   --  3.9   GFR: Estimated Creatinine Clearance: 17.2 mL/min (A) (by C-G formula based on SCr of 4.02 mg/dL (H)).  Liver Function Tests: No results for input(s): AST, ALT, ALKPHOS, BILITOT, PROT, ALBUMIN in the last 168 hours. No results for input(s): LIPASE, AMYLASE in the last 168 hours. No results for input(s): AMMONIA in the last 168 hours.  Coagulation Profile: No results for input(s): INR, PROTIME in the last 168 hours.  Cardiac Enzymes: No results for input(s): CKTOTAL, CKMB, CKMBINDEX, TROPONINI  in the last 168 hours.  HbA1C: No results found for: HGBA1C  CBG: Recent Labs  Lab 07/09/18 0456 07/09/18 0558 07/09/18 0655 07/09/18 0801 07/09/18 0858  GLUCAP 172* 162* 168* 153* 149*    Recent Results (from the past 240 hour(s))  MRSA PCR Screening     Status: None   Collection Time: 07/08/18  6:35 AM  Result Value Ref Range Status   MRSA by PCR NEGATIVE NEGATIVE Final    Comment:        The GeneXpert MRSA Assay (FDA approved for NASAL specimens only), is one component of a comprehensive MRSA colonization surveillance program. It is not intended to diagnose MRSA infection nor to guide  or monitor treatment for MRSA infections. Performed at [facility], [place] [address], [place], [place] [address]   Urine culture     Status: None   Collection Time: 07/08/18  6:41 AM  Result Value Ref Range Status   Specimen Description URINE, CATHETERIZED  Final   Special Requests NONE  Final   Culture   Final    NO GROWTH Performed at [facility], [address], [place], [place] [address]    Report Status 07/09/2018 FINAL  Final  C difficile quick scan w PCR reflex     Status: None   Collection Time: 07/08/18 10:33 PM  Result Value Ref Range Status   C Diff antigen NEGATIVE NEGATIVE Final   C Diff toxin NEGATIVE NEGATIVE Final   C Diff interpretation No C. difficile detected.  Final    Comment: Performed at [facility], [place] [address] [place], [place] [address]     Scheduled Meds: . chlorhexidine  15 mL Mouth Rinse BID  . cholestyramine  4 g Oral TID  . heparin  5,000 Units Subcutaneous Q8H  . insulin aspart  0-5 Units Subcutaneous QHS  . insulin aspart  0-9 Units Subcutaneous TID WC  . mouth rinse  15 mL Mouth Rinse q12n4p     LOS: 1 day   Cherene Altes, MD [facility] Office  [phone] Pager - Text Page per Amion  If 7PM-7AM, please contact night-coverage per Amion 07/09/2018, 9:45 AM

## 2018-07-09 NOTE — Progress Notes (Signed)
Inpatient Diabetes Program Recommendations  AACE/ADA: New Consensus Statement on Inpatient Glycemic Control (2019)  Target Ranges:  Prepandial:   less than 140 mg/dL      Peak postprandial:   less than 180 mg/dL (1-2 hours)      Critically ill patients:  140 - 180 mg/dL   Results for Alyssa Floyd, Alyssa Floyd (MRN [id number]) as of 07/09/2018 15:46  Ref. Range 07/09/2018 08:01 07/09/2018 08:58 07/09/2018 10:00 07/09/2018 10:56 07/09/2018 11:55  Glucose-Capillary Latest Ref Range: 70 - 99 mg/dL 153 (H) 149 (H) 155 (H) 131 (H) 183 (H)  Results for Alyssa Floyd, Alyssa Floyd (MRN [id number]) as of 07/09/2018 15:46  Ref. Range 07/09/2018 00:46 07/09/2018 01:51 07/09/2018 02:53 07/09/2018 03:59 07/09/2018 04:56 07/09/2018 05:58 07/09/2018 06:55  Glucose-Capillary Latest Ref Range: 70 - 99 mg/dL 177 (H) 171 (H) 199 (H) 193 (H) 172 (H) 162 (H) 168 (H)  Results for Alyssa Floyd, Alyssa Floyd (MRN [id number]) as of 07/09/2018 15:46  Ref. Range 07/08/2018 00:13  Glucose Latest Ref Range: 70 - 99 mg/dL 1,033 (HH)   Review of Glycemic Control  Diabetes history: DM1 Outpatient Diabetes medications: Lantus and Novolog insulin Current orders for Inpatient glycemic control: Lantus 15 units daily, Novolog 3 units TID with meals for meal coverage, Novolog 0-9 units TID with meals, Novolog 0-5 units QHS  NOTE: Spoke with patient about diabetes and home regimen for diabetes control. Patient reports that she is followed by PCP for diabetes management. Patient reports that was recently at [facility] for [name] and was discharged on 07/06/2018 and she went to stay with her cousin.  Patient reports that she does not recall if she took any insulin since she was discharged on 07/06/2018 from [facility].  She admits that had not had an opportunity to get prescriptions filled. Patient states that her PCP took her off of short acting insulin 6-7 months ago and she was only using Lantus for DM control and she can not recall the dose she was told to take at time of discharge from  [facility] on 07/06/18. Patient admits that her daughter recently left for college in [place] and she has been very depressed since then because her daughter lived with her and now there is no one home with her and she misses her daughter terrible. Patient was tearful when talking about her daughter.  Inquired about type of DM patient has and she stated that she was told she has Type 1 DM. Discussed DM1 and DM2 and explained that if she has DM1 she is not able to make insulin and her body requires insulin for glucose metabolism. Explained that without insulin, she will go into [name]. Discussed [name] in detail and how it happens.  Discussed Lantus and Novolog and how they work for DM control. Explained to patient that she will need to get Lantus and Humalog prescriptions filled and she will need to take the insulin as prescribed. Noted in the chart patient has an appointment with [facility] on 07/13/18 but patient was not aware of appointment.  Explained that she needed to keep the appointment (which should also be on her discharge summary from [facility] on 07/06/18) and be sure to keep a detailed log of glucose trends and exact insulin dosages so the doctor can use the information to make insulin adjustment to improve DM control.  Discussed A1C results from [facility] (14.3% on 06/29/18) and explained that her current A1C indicates an average glucose of 364 mg/dl over the past 2-3 months. Discussed glucose and A1C goals. Discussed importance  of checking CBGs and maintaining good CBG control to prevent long-term and short-term complications. Explained how hyperglycemia leads to damage within blood vessels which lead to the common complications seen with uncontrolled diabetes. Stressed to the patient the importance of improving glycemic control to prevent further complications from uncontrolled diabetes. Discussed impact of nutrition, exercise, stress, sickness, and medications on diabetes control.Encouraged patient to check her  glucose 4 times per day (before meals and at bedtime) and to keep a log book of glucose readings and insulin taken which she will need to take to doctor appointments. Discussed FreeStyle Libre and encouraged patient to discuss at initial appointment with Endocrinologist if she is interested in using it. Patient reports that she would like a prescription for Lantus and Humalog insulin pens.  Patient verbalized understanding of information discussed and she states that she has no further questions at this time related to diabetes.  Thanks, Barnie Alderman, RN, MSN, CDE Diabetes Coordinator Inpatient Diabetes Program [phone] (Team Pager)

## 2018-07-09 NOTE — Progress Notes (Addendum)
Pt has room on 5W, Report given to [place], Therapist, sports. Pt to be transported after shift change.

## 2018-07-09 NOTE — Progress Notes (Signed)
Messaged Dr. Thereasa Solo re:"Pts Anion Gap:11, has been closed since yesterday afternoon. CBGs have been <200 since 8/11@ 1500. Do we want to d/c insulin gtt?"

## 2018-07-09 NOTE — Progress Notes (Signed)
Received call from lab, pts Ca now 6.3. Relayed to Dr. Thereasa Solo

## 2018-07-10 DIAGNOSIS — E0810 Diabetes mellitus due to underlying condition with ketoacidosis without coma: Secondary | ICD-10-CM

## 2018-07-10 DIAGNOSIS — I10 Essential (primary) hypertension: Secondary | ICD-10-CM

## 2018-07-10 LAB — CBC
HCT: 26.3 % — ABNORMAL LOW (ref 36.0–46.0)
HEMOGLOBIN: 8.5 g/dL — AB (ref 12.0–15.0)
MCH: 28.7 pg (ref 26.0–34.0)
MCHC: 32.3 g/dL (ref 30.0–36.0)
MCV: 88.9 fL (ref 78.0–100.0)
Platelets: 204 10*3/uL (ref 150–400)
RBC: 2.96 MIL/uL — ABNORMAL LOW (ref 3.87–5.11)
RDW: 15.1 % (ref 11.5–15.5)
WBC: 8.1 10*3/uL (ref 4.0–10.5)

## 2018-07-10 LAB — COMPREHENSIVE METABOLIC PANEL
ALBUMIN: 1.4 g/dL — AB (ref 3.5–5.0)
ALT: 18 U/L (ref 0–44)
ANION GAP: 10 (ref 5–15)
AST: 14 U/L — AB (ref 15–41)
Alkaline Phosphatase: 97 U/L (ref 38–126)
BUN: 42 mg/dL — ABNORMAL HIGH (ref 6–20)
CHLORIDE: 108 mmol/L (ref 98–111)
CO2: 19 mmol/L — AB (ref 22–32)
Calcium: 6.8 mg/dL — ABNORMAL LOW (ref 8.9–10.3)
Creatinine, Ser: 3.84 mg/dL — ABNORMAL HIGH (ref 0.44–1.00)
GFR calc Af Amer: 15 mL/min — ABNORMAL LOW (ref >60)
GFR calc non Af Amer: 13 mL/min — ABNORMAL LOW (ref >60)
GLUCOSE: 135 mg/dL — AB (ref 70–99)
Potassium: 3.7 mmol/L (ref 3.5–5.1)
SODIUM: 137 mmol/L (ref 135–145)
TOTAL PROTEIN: 4.2 g/dL — AB (ref 6.5–8.1)
Total Bilirubin: 0.3 mg/dL (ref 0.3–1.2)

## 2018-07-10 LAB — GLUCOSE, CAPILLARY
GLUCOSE-CAPILLARY: 135 mg/dL — AB (ref 70–99)
GLUCOSE-CAPILLARY: 49 mg/dL — AB (ref 70–99)
GLUCOSE-CAPILLARY: 130 mg/dL — AB (ref 70–99)
Glucose-Capillary: 187 mg/dL — ABNORMAL HIGH (ref 70–99)
Glucose-Capillary: 40 mg/dL — CL (ref 70–99)
Glucose-Capillary: 100 mg/dL — ABNORMAL HIGH (ref 70–99)

## 2018-07-10 LAB — MAGNESIUM: Magnesium: 1.9 mg/dL (ref 1.7–2.4)

## 2018-07-10 MED ORDER — INSULIN ASPART 100 UNIT/ML ~~LOC~~ SOLN: 0.0000 [IU] | Freq: Every day | SUBCUTANEOUS | Status: DC

## 2018-07-10 MED ORDER — INSULIN ASPART 100 UNIT/ML ~~LOC~~ SOLN
0.0000 [IU] | Freq: Three times a day (TID) | SUBCUTANEOUS | Status: DC
  Administered 2018-07-11: 9 [IU] via SUBCUTANEOUS
  Administered 2018-07-11: 7 [IU] via SUBCUTANEOUS

## 2018-07-10 MED ORDER — DEXTROSE 50 % IV SOLN
INTRAVENOUS | Status: AC
  Administered 2018-07-10: 25 mL
  Filled 2018-07-10: qty 50

## 2018-07-10 MED ORDER — INSULIN GLARGINE 100 UNIT/ML ~~LOC~~ SOLN
11.0000 [IU] | Freq: Every day | SUBCUTANEOUS | Status: DC
  Filled 2018-07-10: qty 0.11

## 2018-07-10 NOTE — Progress Notes (Addendum)
[place] TEAM 1 - Stepdown/ICU TEAM  Alyssa Floyd  UUV:[id number] DOB: [birth year] DOA: 07/07/2018 PCP: Patient, No Pcp Per    Brief Narrative:  52yo F  with DM1, CKD Stage 4, HTN, Right Breast Cancer s/p Right Partial Mastectomy 10/2016 followed by Chemo + XRT, Anemia, and HLD w/ an admission 8/2-8/9 at [facility] for [name]. She presented to ED on 8/11 with hyperglycemia and was found to be in [name]. Patient reported she had been taking her medications as prescribed, but was unable to pick up her new meds.   Significant Events: 8/10 admit  8/12 insulin gtt stopped   Subjective: Very emotional about her daughter going away to school in [place].  Denies cp, sob, n/v, or abdom pain.    Assessment & Plan:  [name] - uncontrolled DM1 Anion gap closed, bicarb improved, and CBG controlled - transition off insulin gtt - appears to be a simple compliance issue - resume intended home regimen and follow  - pt thinks she got very upset when her only child (she is a single mother) went away to divinity school in [place] last week and forgot to take her insulin - no signs of infection; blood and urine cx's negative, asymptomatic at this time - BS low this afternoon > lower lantus 15u to 11u qd as she is taking at home - dc meal coverage 3u for now - cont SSI ac and hs - prob ok for dc tomorrow  HTN Reasonably controlled at this time   HLD Continue home lipitor  Acute on CKD Stage 4 Baseline crt 2.6-3.2 - creat at baseline   Anemia of CKD - most likely CKD related, Hb 8's, observe  Hypocalcemia - very low alb, adj Ca not that bad - stable, no symptoms  Anemia of CKD No acute blood loss - follow trend   Hist of R breast CA s/p Partial mastectomy + Chemo + XRT Followed by [facility]  DVT prophylaxis: SQ heparin  Code Status: FULL CODE Family Communication: no family present at time of exam  Disposition Plan: anticipate possible dc tomorrow if remains stable   Consultants:  PCCM  Antimicrobials:    none   Objective: Blood pressure 132/71, pulse 67, temperature 98.2 F (36.8 C), resp. rate 20, height 5\' 5"  (1.651 m), weight 82.1 kg, SpO2 100 %.  Intake/Output Summary (Last 24 hours) at 07/10/2018 1759 Last data filed at 07/10/2018 1600 Gross per 24 hour  Intake 397.71 ml  Output -  Net 397.71 ml   Filed Weights   07/09/18 0500 07/09/18 2024 07/10/18 0500  Weight: 78.7 kg 82.1 kg 82.1 kg    Examination: General: No acute respiratory distress Lungs: Clear to auscultation bilaterally without wheezes or crackles Cardiovascular: Regular rate and rhythm without murmur gallop or rub normal S1 and S2 Abdomen: Nontender, nondistended, soft, bowel sounds positive, no rebound, no ascites, no appreciable mass Extremities: no LE edema  CBC: Recent Labs  Lab 07/08/18 0013 07/10/18 0414  WBC 14.6* 8.1  NEUTROABS 12.4*  --   HGB 9.0* 8.5*  HCT 32.5* 26.3*  MCV 105.2* 88.9  PLT 264 474   Basic Metabolic Panel: Recent Labs  Lab 07/09/18 0359 07/09/18 1325 07/10/18 0414  NA 137 135 137  K 3.6 3.6 3.7  CL 109 109 108  CO2 17* 18* 19*  GLUCOSE 204* 181* 135*  BUN 48* 45* 42*  CREATININE 4.02* 3.85* 3.84*  CALCIUM 6.4* 6.3* 6.8*  MG 1.5*  --  1.9  PHOS 3.9  --   --  GFR: Estimated Creatinine Clearance: 18.3 mL/min (A) (by C-G formula based on SCr of 3.84 mg/dL (H)).  Liver Function Tests: Recent Labs  Lab 07/10/18 0414  AST 14*  ALT 18  ALKPHOS 97  BILITOT 0.3  PROT 4.2*  ALBUMIN 1.4*   No results for input(s): LIPASE, AMYLASE in the last 168 hours. No results for input(s): AMMONIA in the last 168 hours.  Coagulation Profile: No results for input(s): INR, PROTIME in the last 168 hours.  Cardiac Enzymes: No results for input(s): CKTOTAL, CKMB, CKMBINDEX, TROPONINI in the last 168 hours.  HbA1C: No results found for: HGBA1C  CBG: Recent Labs  Lab 07/09/18 1749 07/09/18 2029 07/10/18 0801 07/10/18 1158 07/10/18 1739  GLUCAP 184* 102* 187* 135* 40*     Recent Results (from the past 240 hour(s))  Culture, blood (routine x 2)     Status: None (Preliminary result)   Collection Time: 07/08/18  3:14 AM  Result Value Ref Range Status   Specimen Description BLOOD LEFT HAND  Final   Special Requests   Final    BOTTLES DRAWN AEROBIC AND ANAEROBIC Blood Culture results may not be optimal due to an inadequate volume of blood received in culture bottles   Culture   Final    NO GROWTH 2 DAYS Performed at [facility] [address], [place], [place] [address]    Report Status PENDING  Incomplete  Culture, blood (routine x 2)     Status: None (Preliminary result)   Collection Time: 07/08/18  4:10 AM  Result Value Ref Range Status   Specimen Description BLOOD LEFT HAND  Final   Special Requests   Final    BOTTLES DRAWN AEROBIC AND ANAEROBIC Blood Culture adequate volume   Culture   Final    NO GROWTH 2 DAYS Performed at [facility], [place] [address], [place], [place] [address]    Report Status PENDING  Incomplete  MRSA PCR Screening     Status: None   Collection Time: 07/08/18  6:35 AM  Result Value Ref Range Status   MRSA by PCR NEGATIVE NEGATIVE Final    Comment:        The GeneXpert MRSA Assay (FDA approved for NASAL specimens only), is one component of a comprehensive MRSA colonization surveillance program. It is not intended to diagnose MRSA infection nor to guide or monitor treatment for MRSA infections. Performed at [facility], [place] [address], [place], [place] [address]   Urine culture     Status: None   Collection Time: 07/08/18  6:41 AM  Result Value Ref Range Status   Specimen Description URINE, CATHETERIZED  Final   Special Requests NONE  Final   Culture   Final    NO GROWTH Performed at [facility], [address] [place], [place] [address]    Report Status 07/09/2018 FINAL  Final  C difficile quick scan w PCR reflex     Status: None   Collection Time: 07/08/18 10:33 PM  Result Value  Ref Range Status   C Diff antigen NEGATIVE NEGATIVE Final   C Diff toxin NEGATIVE NEGATIVE Final   C Diff interpretation No C. difficile detected.  Final    Comment: Performed at [facility], [place] [address], [place], [place] [address]     Scheduled Meds: . atorvastatin  20 mg Oral Daily  . calcitRIOL  0.5 mcg Oral Daily  . heparin  5,000 Units Subcutaneous Q8H  .  insulin aspart  0-5 Units Subcutaneous QHS  . insulin aspart  0-9 Units Subcutaneous TID WC  . insulin aspart  3 Units Subcutaneous TID WC  . insulin glargine  15 Units Subcutaneous Daily  . torsemide  50 mg Oral BID     LOS: 2 days   Cherene Altes, MD [facility] Office  [phone] Pager - Text Page per Amion  If 7PM-7AM, please contact night-coverage per Amion 07/10/2018, 5:59 PM

## 2018-07-10 NOTE — Progress Notes (Signed)
Hypoglycemic Event  CBG: 49  Treatment: D50 IV 25 mL  Symptoms: None  Follow-up CBG: Time:1840 CBG Result:100  Possible Reasons for Event: Unknown  Comments/MD notified:Will continue to monitor.    Holley Raring

## 2018-07-10 NOTE — Progress Notes (Signed)
Hypoglycemic Event  CBG: 40  Treatment: 15 GM carbohydrate snack  Symptoms: None  Follow-up CBG: Time:1812 CBG Result:49  Possible Reasons for Event: Unknown  Comments/MD notified:Will grive her dextrose instead.    Holley Raring

## 2018-07-11 LAB — GLUCOSE, CAPILLARY
Glucose-Capillary: 398 mg/dL — ABNORMAL HIGH (ref 70–99)
Glucose-Capillary: 301 mg/dL — ABNORMAL HIGH (ref 70–99)
Glucose-Capillary: 162 mg/dL — ABNORMAL HIGH (ref 70–99)
Glucose-Capillary: 105 mg/dL — ABNORMAL HIGH (ref 70–99)

## 2018-07-11 MED ORDER — HEPARIN SOD (PORK) LOCK FLUSH 100 UNIT/ML IV SOLN
500.0000 [IU] | INTRAVENOUS | Status: AC | PRN
  Administered 2018-07-11: 500 [IU]

## 2018-07-11 MED ORDER — ATORVASTATIN CALCIUM 40 MG PO TABS: 20.0000 mg | ORAL_TABLET | Freq: Every day | ORAL | 1 refills | Status: DC

## 2018-07-11 MED ORDER — INSULIN GLARGINE 100 UNIT/ML ~~LOC~~ SOLN
8.0000 [IU] | Freq: Every day | SUBCUTANEOUS | Status: DC
  Filled 2018-07-11: qty 0.08

## 2018-07-11 MED ORDER — INSULIN GLARGINE 100 UNIT/ML ~~LOC~~ SOLN
11.0000 [IU] | Freq: Every day | SUBCUTANEOUS | Status: DC
  Administered 2018-07-11: 11 [IU] via SUBCUTANEOUS
  Filled 2018-07-11: qty 0.11

## 2018-07-11 MED ORDER — INSULIN ASPART 100 UNIT/ML ~~LOC~~ SOLN: 4.0000 [IU] | Freq: Three times a day (TID) | SUBCUTANEOUS | 0 refills | Status: DC

## 2018-07-11 NOTE — Progress Notes (Signed)
Alyssa Floyd to be D/C'd Home per MD order.  Discussed with the patient and all questions fully answered.  VSS, Skin clean, dry and intact without evidence of skin break down, no evidence of skin tears noted. IV catheter discontinued intact. Site without signs and symptoms of complications. Dressing and pressure applied.  An After Visit Summary was printed and given to the patient. Patient received prescription.  D/c education completed with patient/family including follow up instructions, medication list, d/c activities limitations if indicated, with other d/c instructions as indicated by MD - patient able to verbalize understanding, all questions fully answered.   Patient instructed to return to ED, call 911, or call MD for any changes in condition.   Patient awaiting family member to take her home.  Alyssa Floyd 07/11/2018 6:24 PM

## 2018-07-11 NOTE — Progress Notes (Signed)
   07/11/18 1521  Vitals  BP (!) 142/82  BP Location Left Arm  BP Method Manual  ECG Heart Rate 78  Provider made aware, okay to discharge.

## 2018-07-11 NOTE — Discharge Summary (Signed)
Alyssa Floyd, is a 52 y.o. female  DOB 12/12/1965  MRN [id number].  Admission date:  07/07/2018  Admitting Physician  Reyne Dumas, MD  Discharge Date:  07/11/2018   Primary MD  Patient, No Pcp Per  Recommendations for primary care physician for things to follow:  -Check CBC, BMP during next visit to ensure stable renal function.  Admission Diagnosis  Hyperkalemia [E87.5] [name] (diabetic ketoacidoses) ([place]) [E13.10] Type 1 diabetes mellitus with ketoacidosis without coma ([place]) [E10.10]   Discharge Diagnosis  Hyperkalemia [E87.5] [name] (diabetic ketoacidoses) ([place]) [E13.10] Type 1 diabetes mellitus with ketoacidosis without coma ([place]) [E10.10]    Active Problems:   [name] (diabetic ketoacidoses) ([place])      Past Medical History:  Diagnosis Date  . 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 Diabetes mellitus ([place])   . Diabetic nephropathy ([place])   . Hyperlipidemia   . Hypertension   . Nephrotic syndrome    diabetic nephropathy, biopsy on 01/06/16 at [facility]  . Normocytic anemia   . Subclinical hypothyroidism   . Tobacco use   . Vitamin D deficiency     Past Surgical History:  Procedure Laterality Date  . PARTIAL HYSTERECTOMY         History of present illness and  Hospital Course:     Kindly see H&P for history of present illness and admission details, please review complete Labs, Consult reports and Test reports for all details in brief  HPI  from the history and physical done on the day of admission 07/08/2018 52 year old female with Type 1 DM, CKD Stage 4, HTN, Right Breast Cancer s/p Right Partial Mastectomy 10/2016 followed by ChemoXRT, Anemia, HLD  Recent Admission 8/2-8/9 at [facility] for [name]. Believed to be secondary to patient taking Insulin incorrectly. Noted that patient has been depressed recently due to daughter leaving for college.   Presented to ED on  8/11 with hyperglycemia. Patient states that she has been taking her medications as prescribed, however was unable to pick up new medication yet. Has been staying with cousin since discharge. Denies Fever, Chills, Weakness. VBG pH 7.03, K 7.0, Glucose 1033. Given 3L NS and started on insulin gtt. PCCM asked to admit.    Hospital Course   [id number] F  with DM1, CKD Stage 4, HTN, Right Breast Cancer s/p Right Partial Mastectomy 10/2016 followed by Chemo + XRT, Anemia, and HLD w/ an admission 8/2-8/9 at [facility] for [name]. She presented to ED on 8/11 with hyperglycemia and was found to be in [name]. Patient reported she had been taking her medications as prescribed, but was unable to pick up her new meds.    [name] - uncontrolled DM1 -Initially to ICU under  critical care, required insulin drip, anion gap closed, bicarb improved, and CBG controlled - transition off insulin gtt - appears to be a simple compliance issue , pt thinks she got very upset when her only child (she is a single mother) went away to divinity school in [place]  last week and forgot to take her insulin - no signs of infection; blood and urine cx's negative, asymptomatic at this time -Her CBG was labile during hospital stay, be discharged on her home dose Lantus 11 units subcu daily(she had hypoglycemia on 15 units), will will be discharged on NovoLog 4 units 3 times daily if her CBGs were above 200.  HTN Reasonably controlled at this time, ideally I would like to start her on ACE or arm, but cannot be done with her renal failure  HLD Continue home lipitor  Acute on CKD Stage 4 Baseline crt 2.6-3.2 -Was 4.8 on admission, improving, it is 3.8 today  Anemia of CKD - most likely CKD related, Hb 8's, observe  Hypocalcemia - very low alb, adj Ca not that bad - stable, no symptoms  Hist of R breast CA s/p Partial mastectomy + Chemo + XRT Followed by [facility]  Discharge Condition:  Stable   Follow UP He had a scheduled appointment  follow-up with her PCP on the 15th, and with [facility] on the 16th,      Discharge Instructions  and  Discharge Medications    Discharge Instructions    Discharge instructions   Complete by:  As directed    Follow with Primary MD Patient, you have an appointment scheduled by tomorrow  Get CBC, CMP,  checked  by Primary MD next visit.    Activity: As tolerated with Full fall precautions use walker/cane & assistance as needed   Disposition Home    Diet: Carbohydrate modified  For Heart failure patients - Check your Weight same time everyday, if you gain over 2 pounds, or you develop in leg swelling, experience more shortness of breath or chest pain, call your Primary MD immediately. Follow Cardiac Low Salt Diet and 1.5 lit/day fluid restriction.   On your next visit with your primary care physician please Get Medicines reviewed and adjusted.   Please request your Prim.MD to go over all Hospital Tests and Procedure/Radiological results at the follow up, please get all Hospital records sent to your Prim MD by signing hospital release before you go home.   If you experience worsening of your admission symptoms, develop shortness of breath, life threatening emergency, suicidal or homicidal thoughts you must seek medical attention immediately by calling 911 or calling your MD immediately  if symptoms less severe.  You Must read complete instructions/literature along with all the possible adverse reactions/side effects for all the Medicines you take and that have been prescribed to you. Take any new Medicines after you have completely understood and accpet all the possible adverse reactions/side effects.   Do not drive, operating heavy machinery, perform activities at heights, swimming or participation in water activities or provide baby sitting services if your were admitted for syncope or siezures until you have seen by Primary MD or a Neurologist and advised to do so  again.  Do not drive when taking Pain medications.    Do not take more than prescribed Pain, Sleep and Anxiety Medications  Special Instructions: If you have smoked or chewed Tobacco  in the last 2 yrs please stop smoking, stop any regular Alcohol  and or any Recreational drug use.  Wear Seat belts while driving.   Please note  You were cared for by a hospitalist during your hospital stay. If you have any questions about your discharge medications or the care you received while you were in the hospital after you are discharged, you can call  the unit and asked to speak with the hospitalist on call if the hospitalist that took care of you is not available. Once you are discharged, your primary care physician will handle any further medical issues. Please note that NO REFILLS for any discharge medications will be authorized once you are discharged, as it is imperative that you return to your primary care physician (or establish a relationship with a primary care physician if you do not have one) for your aftercare needs so that they can reassess your need for medications and monitor your lab values.   Increase activity slowly   Complete by:  As directed      Allergies as of 07/11/2018      Reactions   Penicillins Other (See Comments)   Pt reports hallucinations when taking penicillins. >Has patient had a PCN reaction causing immediate rash, facial/tongue/throat swelling, SOB or lightheadedness with hypotension: No Has patient had a PCN reaction causing severe rash involving mucus membranes or skin necrosis: No Has patient had a PCN reaction that required hospitalization No Has patient had a PCN reaction occurring within the last 10 years: No If all of the above answers are "NO", then may proceed with Cephalosporin use.      Medication List    TAKE these medications   atorvastatin 40 MG tablet Commonly known as:  LIPITOR Take 0.5 tablets (20 mg total) by mouth daily.   insulin aspart  100 UNIT/ML injection Commonly known as:  novoLOG Inject 4 Units into the skin 3 (three) times daily with meals. Please take fingersticks reading more than 200 What changed:    how much to take  additional instructions   insulin glargine 100 UNIT/ML injection Commonly known as:  LANTUS Inject 0.15 mLs (15 Units total) into the skin daily. What changed:  how much to take   Protein Powd 25 g by Does not apply route 2 (two) times daily.         Diet and Activity recommendation: See Discharge Instructions above   Consults obtained - None   Major procedures and Radiology Reports - PLEASE review detailed and final reports for all details, in brief -     Dg Chest Port 1 View  Result Date: 07/09/2018 CLINICAL DATA:  Respiratory failure and shortness of breath. EXAM: PORTABLE CHEST 1 VIEW COMPARISON:  07/08/2018 radiographs FINDINGS: Cardiomegaly and LEFT IJ Port-A-Cath with tip overlying the SUPERIOR cavoatrial junction again noted. Mild interstitial prominence is unchanged. No airspace disease, pleural effusion or pneumothorax. RIGHT breast and axillary surgical changes again noted. IMPRESSION: Cardiomegaly without evidence of acute abnormality. Electronically Signed   By: Margarette Canada M.D.   On: 07/09/2018 07:11   Dg Chest Port 1 View  Result Date: 07/08/2018 CLINICAL DATA:  Diabetic ketoacidosis. EXAM: PORTABLE CHEST 1 VIEW COMPARISON:  None. FINDINGS: The lungs are well-aerated. Pulmonary vascularity is at the upper limits of normal. There is no evidence of focal opacification, pleural effusion or pneumothorax. The cardiomediastinal silhouette is borderline normal in size. A left-sided chest port is noted ending about the distal SVC. No acute osseous abnormalities are seen. Clips are seen at the right axilla. IMPRESSION: No acute cardiopulmonary process seen. Electronically Signed   By: Garald Balding M.D.   On: 07/08/2018 03:01    Micro Results    Recent Results (from the past  240 hour(s))  Culture, blood (routine x 2)     Status: None (Preliminary result)   Collection Time: 07/08/18  3:14 AM  Result Value  Ref Range Status   Specimen Description BLOOD LEFT HAND  Final   Special Requests   Final    BOTTLES DRAWN AEROBIC AND ANAEROBIC Blood Culture results may not be optimal due to an inadequate volume of blood received in culture bottles   Culture   Final    NO GROWTH 3 DAYS Performed at [facility], [place] [address], [place], [place] [address]    Report Status PENDING  Incomplete  Culture, blood (routine x 2)     Status: None (Preliminary result)   Collection Time: 07/08/18  4:10 AM  Result Value Ref Range Status   Specimen Description BLOOD LEFT HAND  Final   Special Requests   Final    BOTTLES DRAWN AEROBIC AND ANAEROBIC Blood Culture adequate volume   Culture   Final    NO GROWTH 3 DAYS Performed at [facility], [place] [address], [place], [place] [address]    Report Status PENDING  Incomplete  MRSA PCR Screening     Status: None   Collection Time: 07/08/18  6:35 AM  Result Value Ref Range Status   MRSA by PCR NEGATIVE NEGATIVE Final    Comment:        The GeneXpert MRSA Assay (FDA approved for NASAL specimens only), is one component of a comprehensive MRSA colonization surveillance program. It is not intended to diagnose MRSA infection nor to guide or monitor treatment for MRSA infections. Performed at [facility], [place] [address] [place], [place] [address]   Urine culture     Status: None   Collection Time: 07/08/18  6:41 AM  Result Value Ref Range Status   Specimen Description URINE, CATHETERIZED  Final   Special Requests NONE  Final   Culture   Final    NO GROWTH Performed at [facility], [address], [place], [place] [address]    Report Status 07/09/2018 FINAL  Final  C difficile quick scan w PCR reflex     Status: None   Collection Time: 07/08/18 10:33 PM  Result Value Ref Range Status   C Diff antigen  NEGATIVE NEGATIVE Final   C Diff toxin NEGATIVE NEGATIVE Final   C Diff interpretation No C. difficile detected.  Final    Comment: Performed at [facility], [place] [address] [place], [place] [address]       Today   Subjective:   Maitlyn Penza today has no headache,no chest or  abdominal pain , feels much better today.   Objective:   Blood pressure (!) 187/92, pulse 79, temperature 98.1 F (36.7 C), temperature source Oral, resp. rate 17, height 5\' 5"  (1.651 m), weight 76.6 kg, SpO2 100 %.   Intake/Output Summary (Last 24 hours) at 07/11/2018 1501 Last data filed at 07/10/2018 1900 Gross per 24 hour  Intake 229.97 ml  Output -  Net 229.97 ml    Exam Awake Alert, Oriented x 3, No new F.N deficits, Normal affect Symmetrical Chest wall movement, Good air movement bilaterally, CTAB RRR,No Gallops,Rubs or new Murmurs, No Parasternal Heave +ve B.Sounds, Abd Soft, Non tender, , No rebound -guarding or rigidity. No Cyanosis, Clubbing or edema, No new Rash or bruise  Data Review   CBC w Diff:  Lab Results  Component Value Date   WBC 8.1 07/10/2018   HGB 8.5 (L) 07/10/2018   HCT 26.3 (L) 07/10/2018   PLT 204 07/10/2018   LYMPHOPCT 3 07/08/2018   BANDSPCT 1 12/28/2016  MONOPCT 10 07/08/2018   EOSPCT 0 07/08/2018   BASOPCT 0 07/08/2018    CMP:  Lab Results  Component Value Date   NA 137 07/10/2018   K 3.7 07/10/2018   CL 108 07/10/2018   CO2 19 (L) 07/10/2018   BUN 42 (H) 07/10/2018   CREATININE 3.84 (H) 07/10/2018   PROT 4.2 (L) 07/10/2018   ALBUMIN 1.4 (L) 07/10/2018   BILITOT 0.3 07/10/2018   ALKPHOS 97 07/10/2018   AST 14 (L) 07/10/2018   ALT 18 07/10/2018  .   Total Time in preparing paper work, data evaluation and todays exam - 47 minutes  Phillips Climes M.D on 07/11/2018 at 3:01 PM  [facility]   Office  [phone]

## 2018-07-13 LAB — CULTURE, BLOOD (ROUTINE X 2)
CULTURE: NO GROWTH
Culture: NO GROWTH
Special Requests: ADEQUATE

## 2018-08-11 ENCOUNTER — Other Ambulatory Visit: Payer: Self-pay

## 2018-08-11 ENCOUNTER — Inpatient Hospital Stay
Admission: EM | Admit: 2018-08-11 | Discharge: 2018-08-13 | DRG: 638 | Disposition: A | Discharge status: Home Health | Attending: Internal Medicine | Admitting: Internal Medicine

## 2018-08-11 ENCOUNTER — Emergency Department

## 2018-08-11 ENCOUNTER — Inpatient Hospital Stay

## 2018-08-11 ENCOUNTER — Encounter: Payer: Self-pay | Admitting: Internal Medicine

## 2018-08-11 DIAGNOSIS — Z79899 Other long term (current) drug therapy: Secondary | ICD-10-CM

## 2018-08-11 DIAGNOSIS — Z794 Long term (current) use of insulin: Secondary | ICD-10-CM | POA: Diagnosis not present

## 2018-08-11 DIAGNOSIS — R569 Unspecified convulsions: Secondary | ICD-10-CM

## 2018-08-11 DIAGNOSIS — E1110 Type 2 diabetes mellitus with ketoacidosis without coma: Secondary | ICD-10-CM | POA: Diagnosis present

## 2018-08-11 DIAGNOSIS — F17210 Nicotine dependence, cigarettes, uncomplicated: Secondary | ICD-10-CM | POA: Diagnosis present

## 2018-08-11 DIAGNOSIS — I10 Essential (primary) hypertension: Secondary | ICD-10-CM

## 2018-08-11 DIAGNOSIS — E785 Hyperlipidemia, unspecified: Secondary | ICD-10-CM | POA: Diagnosis not present

## 2018-08-11 DIAGNOSIS — Z853 Personal history of malignant neoplasm of breast: Secondary | ICD-10-CM | POA: Diagnosis not present

## 2018-08-11 DIAGNOSIS — N289 Disorder of kidney and ureter, unspecified: Secondary | ICD-10-CM | POA: Diagnosis present

## 2018-08-11 DIAGNOSIS — Z880 Allergy status to penicillin: Secondary | ICD-10-CM

## 2018-08-11 DIAGNOSIS — G4089 Other seizures: Secondary | ICD-10-CM | POA: Diagnosis not present

## 2018-08-11 DIAGNOSIS — E1121 Type 2 diabetes mellitus with diabetic nephropathy: Secondary | ICD-10-CM | POA: Diagnosis present

## 2018-08-11 DIAGNOSIS — N179 Acute kidney failure, unspecified: Secondary | ICD-10-CM | POA: Diagnosis present

## 2018-08-11 DIAGNOSIS — Z833 Family history of diabetes mellitus: Secondary | ICD-10-CM | POA: Diagnosis not present

## 2018-08-11 DIAGNOSIS — E039 Hypothyroidism, unspecified: Secondary | ICD-10-CM | POA: Diagnosis present

## 2018-08-11 DIAGNOSIS — E875 Hyperkalemia: Secondary | ICD-10-CM | POA: Diagnosis not present

## 2018-08-11 DIAGNOSIS — Z87441 Personal history of nephrotic syndrome: Secondary | ICD-10-CM | POA: Diagnosis not present

## 2018-08-11 DIAGNOSIS — E876 Hypokalemia: Secondary | ICD-10-CM | POA: Diagnosis not present

## 2018-08-11 DIAGNOSIS — N184 Chronic kidney disease, stage 4 (severe): Secondary | ICD-10-CM | POA: Diagnosis present

## 2018-08-11 DIAGNOSIS — E871 Hypo-osmolality and hyponatremia: Secondary | ICD-10-CM | POA: Diagnosis not present

## 2018-08-11 DIAGNOSIS — E873 Alkalosis: Secondary | ICD-10-CM | POA: Diagnosis not present

## 2018-08-11 DIAGNOSIS — E1122 Type 2 diabetes mellitus with diabetic chronic kidney disease: Secondary | ICD-10-CM | POA: Diagnosis not present

## 2018-08-11 DIAGNOSIS — E0810 Diabetes mellitus due to underlying condition with ketoacidosis without coma: Secondary | ICD-10-CM

## 2018-08-11 DIAGNOSIS — I129 Hypertensive chronic kidney disease with stage 1 through stage 4 chronic kidney disease, or unspecified chronic kidney disease: Secondary | ICD-10-CM | POA: Diagnosis present

## 2018-08-11 HISTORY — DX: Malignant neoplasm of unspecified site of unspecified female breast: C50.919

## 2018-08-11 LAB — BLOOD GAS, VENOUS
Acid-base deficit: 10.4 mmol/L — ABNORMAL HIGH (ref 0.0–2.0)
BICARBONATE: 17.2 mmol/L — AB (ref 20.0–28.0)
O2 SAT: 72.1 %
PCO2 VEN: 43 mmHg — AB (ref 44.0–60.0)
PO2 VEN: 47 mmHg — AB (ref 32.0–45.0)
Patient temperature: 37
pH, Ven: 7.21 — ABNORMAL LOW (ref 7.250–7.430)

## 2018-08-11 LAB — BASIC METABOLIC PANEL
Anion gap: 20 — ABNORMAL HIGH (ref 5–15)
Anion gap: 13 (ref 5–15)
Anion gap: 10 (ref 5–15)
BUN: 53 mg/dL — AB (ref 6–20)
BUN: 51 mg/dL — AB (ref 6–20)
BUN: 47 mg/dL — ABNORMAL HIGH (ref 6–20)
CALCIUM: 5.2 mg/dL — AB (ref 8.9–10.3)
CHLORIDE: 88 mmol/L — AB (ref 98–111)
CHLORIDE: 103 mmol/L (ref 98–111)
CO2: 19 mmol/L — AB (ref 22–32)
CO2: 24 mmol/L (ref 22–32)
CO2: 24 mmol/L (ref 22–32)
CREATININE: 4.71 mg/dL — AB (ref 0.44–1.00)
CREATININE: 4.25 mg/dL — AB (ref 0.44–1.00)
CREATININE: 3.83 mg/dL — AB (ref 0.44–1.00)
Calcium: 5.1 mg/dL — CL (ref 8.9–10.3)
Calcium: 5.2 mg/dL — CL (ref 8.9–10.3)
Chloride: 98 mmol/L (ref 98–111)
GFR calc Af Amer: 11 mL/min — ABNORMAL LOW (ref >60)
GFR calc Af Amer: 13 mL/min — ABNORMAL LOW (ref >60)
GFR calc Af Amer: 15 mL/min — ABNORMAL LOW (ref >60)
GFR calc non Af Amer: 10 mL/min — ABNORMAL LOW (ref >60)
GFR calc non Af Amer: 11 mL/min — ABNORMAL LOW (ref >60)
GFR calc non Af Amer: 13 mL/min — ABNORMAL LOW (ref >60)
GLUCOSE: 939 mg/dL — AB (ref 70–99)
GLUCOSE: 351 mg/dL — AB (ref 70–99)
GLUCOSE: 93 mg/dL (ref 70–99)
Potassium: 3.4 mmol/L — ABNORMAL LOW (ref 3.5–5.1)
Potassium: 2.7 mmol/L — CL (ref 3.5–5.1)
Potassium: 3 mmol/L — ABNORMAL LOW (ref 3.5–5.1)
Sodium: 127 mmol/L — ABNORMAL LOW (ref 135–145)
Sodium: 135 mmol/L (ref 135–145)
Sodium: 137 mmol/L (ref 135–145)

## 2018-08-11 LAB — CBC
HCT: 28.6 % — ABNORMAL LOW (ref 35.0–47.0)
Hemoglobin: 9.3 g/dL — ABNORMAL LOW (ref 12.0–16.0)
MCH: 29.8 pg (ref 26.0–34.0)
MCHC: 32.6 g/dL (ref 32.0–36.0)
MCV: 91.3 fL (ref 80.0–100.0)
PLATELETS: 243 10*3/uL (ref 150–440)
RBC: 3.13 MIL/uL — ABNORMAL LOW (ref 3.80–5.20)
RDW: 14.7 % — AB (ref 11.5–14.5)
WBC: 7.9 10*3/uL (ref 3.6–11.0)

## 2018-08-11 LAB — URINALYSIS, COMPLETE (UACMP) WITH MICROSCOPIC
Bilirubin Urine: NEGATIVE
Glucose, UA: >=500 mg/dL — AB
KETONES UR: NEGATIVE mg/dL
Nitrite: NEGATIVE
PH: 6 (ref 5.0–8.0)
Protein, ur: 100 mg/dL — AB
SPECIFIC GRAVITY, URINE: 1.013 (ref 1.005–1.030)

## 2018-08-11 LAB — URINE DRUG SCREEN, QUALITATIVE (ARMC ONLY)
Amphetamines, Ur Screen: NOT DETECTED
BARBITURATES, UR SCREEN: NOT DETECTED
Benzodiazepine, Ur Scrn: NOT DETECTED
CANNABINOID 50 NG, UR ~~LOC~~: NOT DETECTED
Cocaine Metabolite,Ur ~~LOC~~: NOT DETECTED
MDMA (ECSTASY) UR SCREEN: NOT DETECTED
Methadone Scn, Ur: NOT DETECTED
OPIATE, UR SCREEN: NOT DETECTED
Phencyclidine (PCP) Ur S: NOT DETECTED
Tricyclic, Ur Screen: NOT DETECTED

## 2018-08-11 LAB — MAGNESIUM: Magnesium: 1.5 mg/dL — ABNORMAL LOW (ref 1.7–2.4)

## 2018-08-11 LAB — GLUCOSE, CAPILLARY
GLUCOSE-CAPILLARY: 166 mg/dL — AB (ref 70–99)
GLUCOSE-CAPILLARY: 180 mg/dL — AB (ref 70–99)
Glucose-Capillary: >600 mg/dL (ref 70–99)
Glucose-Capillary: 541 mg/dL (ref 70–99)
Glucose-Capillary: 443 mg/dL — ABNORMAL HIGH (ref 70–99)
Glucose-Capillary: 340 mg/dL — ABNORMAL HIGH (ref 70–99)
Glucose-Capillary: 301 mg/dL — ABNORMAL HIGH (ref 70–99)
Glucose-Capillary: 101 mg/dL — ABNORMAL HIGH (ref 70–99)
Glucose-Capillary: 74 mg/dL (ref 70–99)
Glucose-Capillary: 98 mg/dL (ref 70–99)

## 2018-08-11 LAB — MRSA PCR SCREENING: MRSA by PCR: NEGATIVE

## 2018-08-11 MED ORDER — SODIUM CHLORIDE 0.9 % IV SOLN
200.0000 mg | Freq: Two times a day (BID) | INTRAVENOUS | Status: DC
  Administered 2018-08-11 – 2018-08-12 (×3): 200 mg via INTRAVENOUS
  Filled 2018-08-11 (×4): qty 20

## 2018-08-11 MED ORDER — POTASSIUM CHLORIDE 20 MEQ PO PACK: 40.0000 meq | PACK | Freq: Once | ORAL | Status: DC

## 2018-08-11 MED ORDER — AMLODIPINE BESYLATE 5 MG PO TABS
5.0000 mg | ORAL_TABLET | Freq: Every day | ORAL | Status: DC
  Administered 2018-08-12 – 2018-08-13 (×2): 5 mg via ORAL
  Filled 2018-08-11 (×2): qty 1

## 2018-08-11 MED ORDER — INSULIN DETEMIR 100 UNIT/ML ~~LOC~~ SOLN: 15.0000 [IU] | SUBCUTANEOUS | Status: DC

## 2018-08-11 MED ORDER — INSULIN ASPART 100 UNIT/ML ~~LOC~~ SOLN
3.0000 [IU] | Freq: Three times a day (TID) | SUBCUTANEOUS | Status: DC
  Administered 2018-08-12 (×3): 3 [IU] via SUBCUTANEOUS
  Filled 2018-08-11 (×3): qty 1

## 2018-08-11 MED ORDER — INSULIN ASPART 100 UNIT/ML ~~LOC~~ SOLN
10.0000 [IU] | Freq: Once | SUBCUTANEOUS | Status: AC
  Administered 2018-08-11: 10 [IU] via INTRAVENOUS

## 2018-08-11 MED ORDER — DEXTROSE-NACL 5-0.45 % IV SOLN
INTRAVENOUS | Status: DC
  Administered 2018-08-11: 21:00:00 via INTRAVENOUS

## 2018-08-11 MED ORDER — POTASSIUM CHLORIDE CRYS ER 20 MEQ PO TBCR
30.0000 meq | EXTENDED_RELEASE_TABLET | ORAL | Status: AC
  Administered 2018-08-12 (×3): 30 meq via ORAL
  Filled 2018-08-11 (×3): qty 2

## 2018-08-11 MED ORDER — TAMOXIFEN CITRATE 10 MG PO TABS
20.0000 mg | ORAL_TABLET | Freq: Every day | ORAL | Status: DC
  Administered 2018-08-12 – 2018-08-13 (×2): 20 mg via ORAL
  Filled 2018-08-11 (×2): qty 2

## 2018-08-11 MED ORDER — SODIUM CHLORIDE 0.9 % IV SOLN
1.0000 g | Freq: Once | INTRAVENOUS | Status: AC
  Administered 2018-08-12: 1 g via INTRAVENOUS
  Filled 2018-08-11: qty 10

## 2018-08-11 MED ORDER — ACETAMINOPHEN 325 MG PO TABS: 650.0000 mg | ORAL_TABLET | Freq: Four times a day (QID) | ORAL | Status: DC | PRN

## 2018-08-11 MED ORDER — POTASSIUM CHLORIDE 20 MEQ PO PACK: 40.0000 meq | PACK | ORAL | Status: DC

## 2018-08-11 MED ORDER — POTASSIUM CHLORIDE 10 MEQ/100ML IV SOLN
10.0000 meq | INTRAVENOUS | Status: AC
  Administered 2018-08-11 (×3): 10 meq via INTRAVENOUS
  Filled 2018-08-11 (×3): qty 100

## 2018-08-11 MED ORDER — INSULIN ASPART 100 UNIT/ML ~~LOC~~ SOLN
SUBCUTANEOUS | Status: AC
  Filled 2018-08-11: qty 1

## 2018-08-11 MED ORDER — SODIUM CHLORIDE 0.9 % IV SOLN
1.0000 g | INTRAVENOUS | Status: DC
  Administered 2018-08-11 – 2018-08-12 (×2): 1 g via INTRAVENOUS
  Filled 2018-08-11: qty 1
  Filled 2018-08-11 (×2): qty 10
  Filled 2018-08-11: qty 1

## 2018-08-11 MED ORDER — POTASSIUM CHLORIDE IN NACL 20-0.9 MEQ/L-% IV SOLN
INTRAVENOUS | Status: DC
  Administered 2018-08-11: 18:00:00 via INTRAVENOUS
  Filled 2018-08-11 (×2): qty 1000

## 2018-08-11 MED ORDER — LORAZEPAM 2 MG/ML IJ SOLN
INTRAMUSCULAR | Status: AC
  Administered 2018-08-11: 2 mg via INTRAVENOUS
  Filled 2018-08-11: qty 1

## 2018-08-11 MED ORDER — POTASSIUM CHLORIDE CRYS ER 20 MEQ PO TBCR
30.0000 meq | EXTENDED_RELEASE_TABLET | ORAL | Status: AC
  Administered 2018-08-11: 30 meq via ORAL
  Filled 2018-08-11: qty 2

## 2018-08-11 MED ORDER — ATORVASTATIN CALCIUM 20 MG PO TABS
20.0000 mg | ORAL_TABLET | Freq: Every day | ORAL | Status: DC
  Administered 2018-08-12 – 2018-08-13 (×2): 20 mg via ORAL
  Filled 2018-08-11 (×2): qty 1

## 2018-08-11 MED ORDER — INSULIN ASPART 100 UNIT/ML ~~LOC~~ SOLN: 0.0000 [IU] | Freq: Every day | SUBCUTANEOUS | Status: DC

## 2018-08-11 MED ORDER — INSULIN ASPART 100 UNIT/ML ~~LOC~~ SOLN
0.0000 [IU] | Freq: Three times a day (TID) | SUBCUTANEOUS | Status: DC
  Administered 2018-08-12: 2 [IU] via SUBCUTANEOUS
  Administered 2018-08-12: 1 [IU] via SUBCUTANEOUS
  Administered 2018-08-13: 5 [IU] via SUBCUTANEOUS
  Filled 2018-08-11 (×3): qty 1

## 2018-08-11 MED ORDER — LORAZEPAM 2 MG/ML IJ SOLN
2.0000 mg | Freq: Once | INTRAMUSCULAR | Status: AC
  Administered 2018-08-11: 2 mg via INTRAVENOUS

## 2018-08-11 MED ORDER — HEPARIN SODIUM (PORCINE) 5000 UNIT/ML IJ SOLN
5000.0000 [IU] | Freq: Three times a day (TID) | INTRAMUSCULAR | Status: DC
  Administered 2018-08-11 – 2018-08-13 (×6): 5000 [IU] via SUBCUTANEOUS
  Filled 2018-08-11 (×6): qty 1

## 2018-08-11 MED ORDER — SODIUM CHLORIDE 0.9 % IV SOLN
INTRAVENOUS | Status: DC
  Administered 2018-08-11: 5.4 [IU]/h via INTRAVENOUS
  Filled 2018-08-11 (×2): qty 1

## 2018-08-11 MED ORDER — SODIUM CHLORIDE 0.9 % IV BOLUS
1000.0000 mL | Freq: Once | INTRAVENOUS | Status: AC
  Administered 2018-08-11: 1000 mL via INTRAVENOUS

## 2018-08-11 MED ORDER — MAGNESIUM SULFATE 4 GM/100ML IV SOLN
4.0000 g | Freq: Once | INTRAVENOUS | Status: AC
  Administered 2018-08-12: 4 g via INTRAVENOUS
  Filled 2018-08-11: qty 100

## 2018-08-11 MED ORDER — ACETAMINOPHEN 650 MG RE SUPP: 650.0000 mg | Freq: Four times a day (QID) | RECTAL | Status: DC | PRN

## 2018-08-11 MED ORDER — MELATONIN 5 MG PO TABS
5.0000 mg | ORAL_TABLET | Freq: Every day | ORAL | Status: DC
  Administered 2018-08-11 – 2018-08-12 (×2): 5 mg via ORAL
  Filled 2018-08-11 (×3): qty 1

## 2018-08-11 MED ORDER — SODIUM CHLORIDE 0.9 % IV SOLN
INTRAVENOUS | Status: AC
  Administered 2018-08-12: 1.7 [IU]/h via INTRAVENOUS
  Filled 2018-08-11: qty 1

## 2018-08-11 NOTE — ED Notes (Signed)
Pt back from ct. bloodwork previously sent.

## 2018-08-11 NOTE — ED Triage Notes (Signed)
While triage pt had a seizure. Shortly afterwards pt was answering questions. Pt medicated with ativan for the seizure. Pt states no prior seizure disorder.

## 2018-08-11 NOTE — Consult Note (Signed)
Reason for Consult:[name] / Seizure Referring Physician: Dr. Tiney Floyd is an 52 y.o. female.  HPI: Alyssa Floyd is a very pleasant 52 year old African-American female with a past medical history of hypertension, hyperlipidemia, hypothyroidism, breast cancer, diabetes mellitus with diabetic nephropathy, presented with seizure both in EMS and in the emergency department. She had been given 2 mg of Ativan by ED. Blood sugar was noted to be elevated at 939, CO2 was reduced at 19 with an elevated anion gap consistent with a diagnosis of [name]. Case discussed with neurology, patient was given Vimpat, CT scan of the head was negative, pending MRI without contrast and was transferred to the intensive care unit for fluid resuscitation and insulin. Patient's renal function was also noted to be elevated with a BUN of 53 and creatinine 4.71. Other pertinent labs are sodium 127 and potassium 3.4  Past Medical History:  Diagnosis Date  . Breast cancer ([place])   . 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 Diabetes mellitus ([place])   . Diabetic nephropathy ([place])   . Hyperlipidemia   . Hypertension   . Nephrotic syndrome    diabetic nephropathy, biopsy on 01/06/16 at [facility]  . Normocytic anemia   . Subclinical hypothyroidism   . Tobacco use   . Vitamin D deficiency     Past Surgical History:  Procedure Laterality Date  . BREAST SURGERY    . PARTIAL HYSTERECTOMY      Family History  Problem Relation Age of Onset  . Diabetes Unknown     Social History:  reports that she has been smoking cigarettes. She has been smoking about 0.50 packs per day. She has never used smokeless tobacco. She reports that she does not drink alcohol or use drugs.  Allergies:  Allergies  Allergen Reactions  . Penicillins Other (See Comments)    Pt reports hallucinations when taking penicillins. >Has patient had a PCN reaction causing immediate rash, facial/tongue/throat swelling, SOB or  lightheadedness with hypotension: No Has patient had a PCN reaction causing severe rash involving mucus membranes or skin necrosis: No Has patient had a PCN reaction that required hospitalization No Has patient had a PCN reaction occurring within the last 10 years: No If all of the above answers are "NO", then may proceed with Cephalosporin use.    Medications: I have reviewed the patient's current medications.  Results for orders placed or performed during the hospital encounter of 08/11/18 (from the past 48 hour(s))  Glucose, capillary     Status: Abnormal   Collection Time: 08/11/18 12:04 PM  Result Value Ref Range   Glucose-Capillary >600 (HH) 70 - 99 mg/dL  Glucose, capillary     Status: Abnormal   Collection Time: 08/11/18 12:05 PM  Result Value Ref Range   Glucose-Capillary >600 (HH) 70 - 99 mg/dL  Blood gas, venous     Status: Abnormal   Collection Time: 08/11/18 12:14 PM  Result Value Ref Range   pH, Ven 7.21 (L) 7.250 - 7.430   pCO2, Ven 43 (L) 44.0 - 60.0 mmHg   pO2, Ven 47.0 (H) 32.0 - 45.0 mmHg   Bicarbonate 17.2 (L) 20.0 - 28.0 mmol/L   Acid-base deficit 10.4 (H) 0.0 - 2.0 mmol/L   O2 Saturation 72.1 %   Patient temperature 37.0    Collection site VEIN    Sample type VEIN     Comment: Performed at [facility], [address] [place], [place] [address]  Basic metabolic  panel     Status: Abnormal   Collection Time: 08/11/18 12:17 PM  Result Value Ref Range   Sodium 127 (L) 135 - 145 mmol/L   Potassium 3.4 (L) 3.5 - 5.1 mmol/L   Chloride 88 (L) 98 - 111 mmol/L   CO2 19 (L) 22 - 32 mmol/L   Glucose, Bld 939 (HH) 70 - 99 mg/dL    Comment: CRITICAL RESULT CALLED TO, READ BACK BY AND VERIFIED WITH SUSAN NEAL 08/11/18 @ 1253  MLK    BUN 53 (H) 6 - 20 mg/dL   Creatinine, Ser 4.71 (H) 0.44 - 1.00 mg/dL   Calcium 5.1 (LL) 8.9 - 10.3 mg/dL    Comment: CRITICAL RESULT CALLED TO, READ BACK BY AND VERIFIED WITH SUSAN NEAL 08/11/18 @ 1253  MLK    GFR calc non  Af Amer 10 (L) >60 mL/min   GFR calc Af Amer 11 (L) >60 mL/min    Comment: (NOTE) The eGFR has been calculated using the CKD EPI equation. This calculation has not been validated in all clinical situations. eGFR's persistently <60 mL/min signify possible Chronic Kidney Disease.    Anion gap 20 (H) 5 - 15    Comment: Performed at [facility], [address] [place], [place] [address]  CBC     Status: Abnormal   Collection Time: 08/11/18 12:17 PM  Result Value Ref Range   WBC 7.9 3.6 - 11.0 K/uL   RBC 3.13 (L) 3.80 - 5.20 MIL/uL   Hemoglobin 9.3 (L) 12.0 - 16.0 g/dL   HCT 28.6 (L) 35.0 - 47.0 %   MCV 91.3 80.0 - 100.0 fL   MCH 29.8 26.0 - 34.0 pg   MCHC 32.6 32.0 - 36.0 g/dL   RDW 14.7 (H) 11.5 - 14.5 %   Platelets 243 150 - 440 K/uL    Comment: Performed at [facility], [address] [place], [place] [address]  Urinalysis, Complete w Microscopic     Status: Abnormal   Collection Time: 08/11/18 12:17 PM  Result Value Ref Range   Color, Urine STRAW (A) YELLOW   APPearance HAZY (A) CLEAR   Specific Gravity, Urine 1.013 1.005 - 1.030   pH 6.0 5.0 - 8.0   Glucose, UA >=500 (A) NEGATIVE mg/dL   Hgb urine dipstick SMALL (A) NEGATIVE   Bilirubin Urine NEGATIVE NEGATIVE   Ketones, ur NEGATIVE NEGATIVE mg/dL   Protein, ur 100 (A) NEGATIVE mg/dL   Nitrite NEGATIVE NEGATIVE   Leukocytes, UA TRACE (A) NEGATIVE   RBC / HPF 0-5 0 - 5 RBC/hpf   WBC, UA 21-50 0 - 5 WBC/hpf   Bacteria, UA RARE (A) NONE SEEN   Squamous Epithelial / LPF 6-10 0 - 5   Hyaline Casts, UA PRESENT     Comment: Performed at [facility], [address] [place], [place] [address]  Glucose, capillary     Status: Abnormal   Collection Time: 08/11/18  1:47 PM  Result Value Ref Range   Glucose-Capillary >600 (HH) 70 - 99 mg/dL  Glucose, capillary     Status: Abnormal   Collection Time: 08/11/18  2:44 PM  Result Value Ref Range   Glucose-Capillary 541 (HH) 70 - 99 mg/dL   Comment  1 Notify RN     Ct Head Wo Contrast  Result Date: 08/11/2018 CLINICAL DATA:  Recent seizure activity, history of breast carcinoma EXAM: CT HEAD WITHOUT CONTRAST TECHNIQUE: Contiguous axial images were obtained from the base of the skull  through the vertex without intravenous contrast. COMPARISON:  01/29/2016 FINDINGS: Brain: Mild atrophic changes are noted. No findings to suggest acute hemorrhage, acute infarction or space-occupying mass lesion are noted. Vascular: No hyperdense vessel or unexpected calcification. Skull: Normal. Negative for fracture or focal lesion. Sinuses/Orbits: No acute finding. Other: None. IMPRESSION: Chronic atrophic changes without acute abnormality. Electronically Signed   By: Inez Catalina M.D.   On: 08/11/2018 12:40    ROS  Patient states she did not have any fever but stays cold. She denies any cough or dysuria symptoms please see history of present illness for pertinent review of systems  Blood pressure (!) 159/83, pulse 76, temperature 98 F (36.7 C), temperature source Oral, resp. rate (!) 26, height 5' 5" (1.651 m), weight 69.9 kg, SpO2 98 %. Physical Exam  Patient is awake, alert, no acute distress Vital signs: Please see the above listed vital signs HEENT: Extraocular muscles intact, no oral lesions appreciated, trachea midline, no thyromegaly appreciated, some facial asymmetry with left-sided droop noted Cardiovascular: Regular rate and rhythm Pulmonary: Clear to auscultation Abdominal: Positive bowel sounds, soft exam Extremities: No clubbing, cyanosis or edema noted Neurologic: Cranial nerves grossly intact, moves all extremities with adequate strength, only abnormality noted is some subtle left-sided facial changes Psychiatric: Normal affect Cutaneous: No rashes or lesions noted  Assessment/Plan:  [name]. Patient with typical findings of elevated blood sugar, anion gap metabolic acidosis,with an anion gap of 20, delta gap of 8, calculated starting  bicarbonate 27 consistent with an initial metabolic alkalosis and subsequent anion gap metabolic acidosis, arterial blood gas not obtained for complete interpretation. Patient was also hyponatremic secondary to elevated blood sugar. Is being admitted for [name] protocol, fluid resuscitation, insulin infusion, close follow-up electrolytes to include potassium, magnesium and phosphorus  Seizure. Patient was started on Vimpat after was given Ativan bolus per neurology, pending EEG. Has had an initial negative CT scan of the head other than chronic atrophic changes. Pending MRI without contrast. Will monitor closely in the intensive care unit  [facility], DO  CONFORTI,JOHN 08/11/2018, 3:07 PM

## 2018-08-11 NOTE — ED Notes (Signed)
Pt report given to icu

## 2018-08-11 NOTE — H&P (Signed)
[facility] [place] NAME: Alyssa Floyd    MR#:  [id number]  DATE OF BIRTH:  1966/08/22  DATE OF ADMISSION:  08/11/2018  PRIMARY CARE PHYSICIAN: Dr Alyssa Floyd  REQUESTING/REFERRING PHYSICIAN: Dr Lisa Roca  CHIEF COMPLAINT:  seizure  HISTORY OF PRESENT ILLNESS:  Alyssa Floyd  is a 52 y.o. female with a known history of diabetes, chronic kidney disease and breast cancer.  Patient presented with seizure.  She had a seizure for EMS.  She improved so quickly they did not give her anything.  She had a another seizure in the ER with her right arm shaking and then total body seizure.  She was talking to the nursing staff after the seizure.  She was given 2 mg of IV Ativan and is now sleepy.  ER physician spoke with neurology and they recommended Vimpat for her seizure with her chronic kidney disease.  She was found to be in diabetic ketoacidosis and hospitalist services were contacted for further evaluation.  PAST MEDICAL HISTORY:   Past Medical History:  Diagnosis Date  . Breast cancer ([place])   . 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 Diabetes mellitus ([place])   . Diabetic nephropathy ([place])   . Hyperlipidemia   . Hypertension   . Nephrotic syndrome    diabetic nephropathy, biopsy on 01/06/16 at [facility]  . Normocytic anemia   . Subclinical hypothyroidism   . Tobacco use   . Vitamin D deficiency     PAST SURGICAL HISTORY:   Past Surgical History:  Procedure Laterality Date  . BREAST SURGERY    . PARTIAL HYSTERECTOMY      SOCIAL HISTORY:   Social History   Tobacco Use  . Smoking status: Current Every Day Smoker    Packs/day: 0.50    Types: Cigarettes  . Smokeless tobacco: Never Used  Substance Use Topics  . Alcohol use: No    FAMILY HISTORY:   Family History  Problem Relation Age of Onset  . Diabetes Unknown     DRUG ALLERGIES:   Allergies  Allergen Reactions  . Penicillins  Other (See Comments)    Pt reports hallucinations when taking penicillins. >Has patient had a PCN reaction causing immediate rash, facial/tongue/throat swelling, SOB or lightheadedness with hypotension: No Has patient had a PCN reaction causing severe rash involving mucus membranes or skin necrosis: No Has patient had a PCN reaction that required hospitalization No Has patient had a PCN reaction occurring within the last 10 years: No If all of the above answers are "NO", then may proceed with Cephalosporin use.    REVIEW OF SYSTEMS:   Very limited secondary to Ativan that was given prior to me seeing her.  Patient kept falling asleep.  CONSTITUTIONAL: No fever.  positive for weakness and weight loss. EYES: Positive for blurred vision  Patient fell asleep after this review of systems.  MEDICATIONS AT HOME:   Prior to Admission medications   Medication Sig Start Date End Date Taking? Authorizing Provider  amLODipine (NORVASC) 5 MG tablet Take 5 mg by mouth daily. 08/01/18  Yes [provider]  atorvastatin (LIPITOR) 40 MG tablet Take 0.5 tablets (20 mg total) by mouth daily. 07/11/18  Yes Elgergawy, Silver Huguenin, MD  insulin aspart (NOVOLOG) 100 UNIT/ML injection Inject 4 Units into the skin 3 (three) times daily with meals. Please take fingersticks reading more than 200 07/11/18  Yes Elgergawy, Silver Huguenin, MD  insulin glargine (  LANTUS) 100 UNIT/ML injection Inject 0.15 mLs (15 Units total) into the skin daily. Patient taking differently: Inject 11 Units into the skin daily.  01/31/16  Yes Norman Herrlich, MD  potassium chloride SA (K-DUR,KLOR-CON) 20 MEQ tablet Take 20 mEq by mouth daily. 08/01/18  Yes [provider]  tamoxifen (NOLVADEX) 20 MG tablet Take 20 mg by mouth daily. 08/01/18  Yes [provider]  torsemide (DEMADEX) 100 MG tablet Take 100 mg by mouth daily. 07/25/18  Yes [provider]  Vitamin D, Ergocalciferol, (DRISDOL) 50000 units CAPS capsule Take  50,000 Units by mouth every 7 (seven) days. 07/26/18  Yes [provider]      VITAL SIGNS:  Blood pressure (!) 147/87, pulse 84, temperature 97.7 F (36.5 C), resp. rate 17, height 5\' 6"  (1.676 m), weight 72.6 kg, SpO2 100 %.  PHYSICAL EXAMINATION:  GENERAL:  52 y.o.-year-old patient lying in the bed with no acute distress.  EYES: Pupils equal, round, reactive to light and accommodation.  HEENT: Head atraumatic, normocephalic. Oropharynx and nasopharynx clear.  NECK:  Supple, no jugular venous distention. No thyroid enlargement, no tenderness.  LUNGS: Normal breath sounds bilaterally, no wheezing, rales,rhonchi or crepitation. No use of accessory muscles of respiration.  CARDIOVASCULAR: S1, S2 normal. No murmurs, rubs, or gallops.  ABDOMEN: Soft, nontender, nondistended. Bowel sounds present. No organomegaly or mass.  EXTREMITIES: No pedal edema, cyanosis, or clubbing.  NEUROLOGIC: As per nursing staff was moving all extremities prior to me coming into the room PSYCHIATRIC: The patient is lethargic and falls asleep easily after me asking her questions.  SKIN: No rash, lesion, or ulcer.   LABORATORY PANEL:   CBC Recent Labs  Lab 08/11/18 1217  WBC 7.9  HGB 9.3*  HCT 28.6*  PLT 243   ------------------------------------------------------------------------------------------------------------------  Chemistries  Recent Labs  Lab 08/11/18 1217  NA 127*  K 3.4*  CL 88*  CO2 19*  GLUCOSE 939*  BUN 53*  CREATININE 4.71*  CALCIUM 5.1*   ------------------------------------------------------------------------------------------------------------------    RADIOLOGY:  Ct Head Wo Contrast  Result Date: 08/11/2018 CLINICAL DATA:  Recent seizure activity, history of breast carcinoma EXAM: CT HEAD WITHOUT CONTRAST TECHNIQUE: Contiguous axial images were obtained from the base of the skull through the vertex without intravenous contrast. COMPARISON:  01/29/2016 FINDINGS:  Brain: Mild atrophic changes are noted. No findings to suggest acute hemorrhage, acute infarction or space-occupying mass lesion are noted. Vascular: No hyperdense vessel or unexpected calcification. Skull: Normal. Negative for fracture or focal lesion. Sinuses/Orbits: No acute finding. Other: None. IMPRESSION: Chronic atrophic changes without acute abnormality. Electronically Signed   By: Inez Catalina M.D.   On: 08/11/2018 12:40    EKG:   Normal sinus rhythm 89 bpm no acute ST-T wave changes  IMPRESSION AND PLAN:   1.  Diabetic ketoacidosis.  As per the ER physician the patient did not take her insulin this morning.  Patient was placed on insulin drip [name] protocol in the ED.  Need to watch sugars closely because of her chronic kidney disease. 2.  New onset seizure.  MRI of the brain will have to be without contrast secondary to her chronic kidney disease.  Order EEG.  ER physician consulted Dr. Doy Mince neurology and Vimpat was ordered. 3.  Acute kidney injury on chronic kidney disease stage IV.  Watch closely with IV fluid hydration. 4.  Essential hypertension continue usual medications tomorrow 5.  Hyperlipidemia unspecified on Lipitor.  Start tomorrow. 6.  History of breast cancer  on tamoxifen. 7.  Hyponatremia secondary to diabetic ketoacidosis 8.  Urinalysis with 20-51 white blood cells and trace leukocyte esterase.  Send off urine culture and empiric Rocephin  All the records are reviewed and case discussed with ED provider. Management plans discussed with the patient, family and they are in agreement.  CODE STATUS: full code  TOTAL TIME TAKING CARE OF THIS PATIENT: 50 minutes, patient will be admitted to the ICU stepdown unit for closer critical monitoring.    Loletha Grayer M.D on 08/11/2018 at 1:58 PM  Between 7am to 6pm - Pager - [phone]  After 6pm call admission pager [phone]  [facility] Office  [phone]  CC: Primary care physician; Alyssa Floyd

## 2018-08-11 NOTE — Consult Note (Signed)
[place] for Electrolyte Monitoring and Replacement  Indication: [name]   Labs: Recent Labs    08/11/18 1217 08/11/18 1732  WBC 7.9  --   HGB 9.3*  --   HCT 28.6*  --   PLT 243  --   CREATININE 4.71* 4.25*   Potassium (mmol/L)  Date Value  08/11/2018 2.7 (LL)  ] Potassium (mmol/L)  Date Value  08/11/2018 2.7 (LL)   Magnesium (mg/dL)  Date Value  07/10/2018 1.9   Calcium (mg/dL)  Date Value  08/11/2018 5.2 (LL)   Albumin (g/dL)  Date Value  07/10/2018 1.4 (L)   Phosphorus (mg/dL)  Date Value  07/09/2018 3.9  ] Estimated Creatinine Clearance: 15.4 mL/min (A) (by C-G formula based on SCr of 4.25 mg/dL (H)).    Assessment: Pharmacy consulted for electrolyte monitoring and replacement in 52 yo female admitted to ICU for [name]. Patient has PMH of diabetic nephropathy. K+ on admission 3.4. Last K level 2.7.   Goal of Therapy:  K: 3.5-5  Plan:  9/14 Will not replace too aggressively due to patient's renal function.  Will order KCL10mg  IV x 3 doses and KCL 30 mEq PO x 2 doses.  Will recheck K with AM labs and continue to replace as needed.  Pernell Dupre, PharmD, BCPS Clinical Pharmacist 08/11/2018 6:29 PM

## 2018-08-11 NOTE — ED Provider Notes (Signed)
[facility] Emergency Department Provider Note ____________________________________________   I have reviewed the triage vital signs and the triage nursing note.  HISTORY  Chief Complaint No chief complaint on file. seizure, hyperglycemia  Historian Patient  HPI Alyssa Floyd is a 52 y.o. female with a history of insulin dependent diabetes, hypertension, renal dysfunction/nephropathy, and no history of known prior seizure or stroke, presents to the emergency department with a chief complaint of right arm shaking intermittently a few times yesterday and today.  This morning she checked her blood sugar and it was apparently above 300 and EMS checked and was above the upper limit of their glucometer.  Reportedly she had a witnessed seizure that was very short-lived and patient had no postictal period after it.  She is complaining of a mild headache.  Tells me that she has had arm shaking on the right a few times yesterday as well as today.  She also thinks that her right leg may have been weak yesterday.  Positive history for [name] in the past.     Past Medical History:  Diagnosis Date  . 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 Diabetes mellitus ([place])   . Diabetic nephropathy ([place])   . Hyperlipidemia   . Hypertension   . Nephrotic syndrome    diabetic nephropathy, biopsy on 01/06/16 at [facility]  . Normocytic anemia   . Subclinical hypothyroidism   . Tobacco use   . Vitamin D deficiency     Patient Active Problem List   Diagnosis Date Noted  . [name] (diabetic ketoacidoses) ([place]) 07/08/2018  . Dehydration   . Dysarthria   . HHNC (hyperglycemic hyperosmolar nonketotic coma) ([place])   . Acute encephalopathy   . Acute renal failure (ARF) ([place]) 01/29/2016  . [name], type 1 ([place]) 01/29/2016  . Prolonged Q-T interval on ECG 01/29/2016  . Hyperlipidemia 01/29/2016  . Hypertension 01/29/2016  . Chronic diarrhea 01/29/2016  . Nephrotic  syndrome 01/29/2016  . Tobacco use 01/29/2016  . 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 Normocytic anemia 01/29/2016  . Subclinical hypothyroidism 01/29/2016  . Encephalopathy acute     Past Surgical History:  Procedure Laterality Date  . PARTIAL HYSTERECTOMY      Prior to Admission medications   Medication Sig Start Date End Date Taking? Authorizing Provider  amLODipine (NORVASC) 5 MG tablet Take 5 mg by mouth daily. 08/01/18  Yes [provider]  atorvastatin (LIPITOR) 40 MG tablet Take 0.5 tablets (20 mg total) by mouth daily. 07/11/18  Yes Elgergawy, Silver Huguenin, MD  insulin aspart (NOVOLOG) 100 UNIT/ML injection Inject 4 Units into the skin 3 (three) times daily with meals. Please take fingersticks reading more than 200 07/11/18  Yes Elgergawy, Silver Huguenin, MD  insulin glargine (LANTUS) 100 UNIT/ML injection Inject 0.15 mLs (15 Units total) into the skin daily. Patient taking differently: Inject 11 Units into the skin daily.  01/31/16  Yes Norman Herrlich, MD  potassium chloride SA (K-DUR,KLOR-CON) 20 MEQ tablet Take 20 mEq by mouth daily. 08/01/18  Yes [provider]  tamoxifen (NOLVADEX) 20 MG tablet Take 20 mg by mouth daily. 08/01/18  Yes [provider]  torsemide (DEMADEX) 100 MG tablet Take 100 mg by mouth daily. 07/25/18  Yes [provider]  Vitamin D, Ergocalciferol, (DRISDOL) 50000 units CAPS capsule Take 50,000 Units by mouth every 7 (seven) days. 07/26/18  Yes [provider]  Protein POWD 25 g by Does not apply route 2 (two) times  daily. Patient not taking: Reported on 01/29/2016 12/20/15   Harvest Dark, MD    Allergies  Allergen Reactions  . Penicillins Other (See Comments)    Pt reports hallucinations when taking penicillins. >Has patient had a PCN reaction causing immediate rash, facial/tongue/throat swelling, SOB or lightheadedness with hypotension: No Has patient had a PCN reaction causing severe rash involving mucus membranes or skin  necrosis: No Has patient had a PCN reaction that required hospitalization No Has patient had a PCN reaction occurring within the last 10 years: No If all of the above answers are "NO", then may proceed with Cephalosporin use.    Family History  Problem Relation Age of Onset  . Diabetes Unknown     Social History Social History   Tobacco Use  . Smoking status: Current Every Day Smoker    Packs/day: 0.50    Types: Cigarettes  . Smokeless tobacco: Never Used  Substance Use Topics  . Alcohol use: No  . Drug use: No    Review of Systems  Constitutional: Negative for fever. Eyes: Negative for visual changes. ENT: Negative for sore throat. Cardiovascular: Negative for chest pain. Respiratory: Negative for shortness of breath. Gastrointestinal: Negative for abdominal pain, vomiting and diarrhea. Genitourinary: Negative for dysuria. Musculoskeletal: Negative for back pain. Skin: Negative for rash. Neurological: Positive as per HPI for headache.  ____________________________________________   PHYSICAL EXAM:  VITAL SIGNS: ED Triage Vitals  Enc Vitals Group     BP 08/11/18 1205 (!) 184/80     Pulse Rate 08/11/18 1205 87     Resp 08/11/18 1205 20     Temp 08/11/18 1205 97.7 F (36.5 C)     Temp src --      SpO2 08/11/18 1205 100 %     Weight 08/11/18 1236 160 lb (72.6 kg)     Height 08/11/18 1236 5\' 6"  (1.676 m)     Head Circumference --      Peak Flow --      Pain Score --      Pain Loc --      Pain Edu? --      Excl. in [place]? --      Constitutional: Alert and oriented, cooperative.  HEENT      Head: Normocephalic and atraumatic.      Eyes: Conjunctivae are normal. Pupils equal and round.       Ears:         Nose: No congestion/rhinnorhea.      Mouth/Throat: Mucous membranes are moist.      Neck: No stridor. Cardiovascular/Chest: Normal rate, regular rhythm.  No murmurs, rubs, or gallops. Respiratory: Normal respiratory effort without tachypnea nor  retractions. Breath sounds are clear and equal bilaterally. No wheezes/rales/rhonchi. Gastrointestinal: Soft. No distention, no guarding, no rebound. Nontender.    Genitourinary/rectal:Deferred Musculoskeletal: Nontender with normal range of motion in all extremities. No joint effusions.  No lower extremity tenderness.  No edema. Neurologic: No facial droop.  Normal speech and language.  Right lower externally 4 out of 5, other 3 extremities 5 out of 5 strength.  Denies sensory changes. Skin:  Skin is warm, dry and intact. No rash noted. Psychiatric: Mood and affect are normal. Speech and behavior are normal. Patient exhibits appropriate insight and judgment.   ____________________________________________  LABS (pertinent positives/negatives) I, Lisa Roca, MD the attending physician have reviewed the labs noted below.  Labs Reviewed  GLUCOSE, CAPILLARY - Abnormal; Notable for the following components:      Result  Value   Glucose-Capillary >600 (*)    All other components within normal limits  GLUCOSE, CAPILLARY - Abnormal; Notable for the following components:   Glucose-Capillary >600 (*)    All other components within normal limits  BASIC METABOLIC PANEL - Abnormal; Notable for the following components:   Sodium 127 (*)    Potassium 3.4 (*)    Chloride 88 (*)    CO2 19 (*)    Glucose, Bld 939 (*)    BUN 53 (*)    Creatinine, Ser 4.71 (*)    Calcium 5.1 (*)    GFR calc non Af Amer 10 (*)    GFR calc Af Amer 11 (*)    Anion gap 20 (*)    All other components within normal limits  CBC - Abnormal; Notable for the following components:   RBC 3.13 (*)    Hemoglobin 9.3 (*)    HCT 28.6 (*)    RDW 14.7 (*)    All other components within normal limits  BLOOD GAS, VENOUS - Abnormal; Notable for the following components:   pH, Ven 7.21 (*)    pCO2, Ven 43 (*)    pO2, Ven 47.0 (*)    Bicarbonate 17.2 (*)    Acid-base deficit 10.4 (*)    All other components within normal limits   URINALYSIS, COMPLETE (UACMP) WITH MICROSCOPIC  CBG MONITORING, ED    ____________________________________________    EKG I, Lisa Roca, MD, the attending physician have personally viewed and interpreted all ECGs.  89 beats minute.  Normal sinus rhythm.  Narrow QRS.  Normal axis.  Normal ST and T wave ____________________________________________  RADIOLOGY   CT head without contrast, viewed by me and I reviewed the radiologist report:  IMPRESSION: Chronic atrophic changes without acute abnormality. __________________________________________  PROCEDURES  Procedure(s) performed: None  Procedures  Critical Care performed: CRITICAL CARE Performed by: Lisa Roca   Total critical care time: 45 minutes  Critical care time was exclusive of separately billable procedures and treating other patients.  Critical care was necessary to treat or prevent imminent or life-threatening deterioration.  Critical care was time spent personally by me on the following activities: development of treatment plan with patient and/or surrogate as well as nursing, discussions with consultants, evaluation of patient's response to treatment, examination of patient, obtaining history from patient or surrogate, ordering and performing treatments and interventions, ordering and review of laboratory studies, ordering and review of radiographic studies, pulse oximetry and re-evaluation of patient's condition.    ____________________________________________  ED COURSE / ASSESSMENT AND PLAN  Pertinent labs & imaging results that were available during my care of the patient were reviewed by me and considered in my medical decision making (see chart for details).    Patient arrived with a complaint initially for hyperglycemia, then it was drawn out that she did have a seizure with EMS, and she reports shaking of her right arm yesterday and today.  She did have shaking of the right arm which looked like  possible partial seizure activity and then after several minutes she did go into a generalized tonic-clonic seizure.  It did extinguish on its own and probably less than 30 seconds, patient was given 2 mill grams of IV Ativan.  Sugar on fingerstick was too high.  She is hypertensive here, so out of concern about potential intracranial emergency, did not initiate IV fluid bolus at that time, but patient missed her insulin dose this morning so she was given IV insulin.  Patient was sent immediately to the CT scan of her head.  CT of the head was negative for an obvious stroke or bleed or mass lesion.  Laboratory studies came back indicating [name].  At this point glucose stabilizer will be started.  At this point I am going to also start IV fluids as she is likely dehydrated as a component of her [name].  She has chronic kidney dysfunction, may be slightly worse than baseline with a GFR from 13 to 14-10.  I consulted Dr. Doy Mince with neurology by phone with regard to new onset seizure, and she recommends Vimpat 200 mg IV now and 100 mg twice daily and seizure work-up.  Patient is going to need additional new onset stroke work-up especially with the partial component including MRI and likely EEG in the hospital.    CONSULTATIONS: Dr. Doy Mince, neurology by phone.  Patient will be seen in the hospital.  Hospitalist for admission.   Patient / Family / Caregiver informed of clinical course, medical decision-making process, and agree with plan.    ___________________________________________   FINAL CLINICAL IMPRESSION(S) / ED DIAGNOSES   Final diagnoses:  Seizure ([place])  Diabetic ketoacidosis without coma associated with diabetes mellitus due to underlying condition ([place])  Hypertension, unspecified type      ___________________________________________         Note: This dictation was prepared with Dragon dictation. Any transcriptional errors that result from this process are  unintentional    Lisa Roca, MD 08/11/18 1339

## 2018-08-12 ENCOUNTER — Other Ambulatory Visit: Payer: Self-pay

## 2018-08-12 DIAGNOSIS — R569 Unspecified convulsions: Secondary | ICD-10-CM

## 2018-08-12 LAB — BASIC METABOLIC PANEL
ANION GAP: 13 (ref 5–15)
ANION GAP: 11 (ref 5–15)
ANION GAP: 8 (ref 5–15)
ANION GAP: 8 (ref 5–15)
BUN: 47 mg/dL — ABNORMAL HIGH (ref 6–20)
BUN: 44 mg/dL — ABNORMAL HIGH (ref 6–20)
BUN: 44 mg/dL — ABNORMAL HIGH (ref 6–20)
BUN: 42 mg/dL — AB (ref 6–20)
CALCIUM: 5.4 mg/dL — AB (ref 8.9–10.3)
CALCIUM: 5.9 mg/dL — AB (ref 8.9–10.3)
CALCIUM: 6.1 mg/dL — AB (ref 8.9–10.3)
CALCIUM: 5.9 mg/dL — AB (ref 8.9–10.3)
CHLORIDE: 104 mmol/L (ref 98–111)
CO2: 19 mmol/L — AB (ref 22–32)
CO2: 22 mmol/L (ref 22–32)
CO2: 22 mmol/L (ref 22–32)
CO2: 21 mmol/L — ABNORMAL LOW (ref 22–32)
CREATININE: 3.83 mg/dL — AB (ref 0.44–1.00)
CREATININE: 3.6 mg/dL — AB (ref 0.44–1.00)
CREATININE: 3.55 mg/dL — AB (ref 0.44–1.00)
Chloride: 102 mmol/L (ref 98–111)
Chloride: 102 mmol/L (ref 98–111)
Chloride: 104 mmol/L (ref 98–111)
Creatinine, Ser: 3.43 mg/dL — ABNORMAL HIGH (ref 0.44–1.00)
GFR calc non Af Amer: 14 mL/min — ABNORMAL LOW (ref >60)
GFR, EST AFRICAN AMERICAN: 15 mL/min — AB (ref >60)
GFR, EST AFRICAN AMERICAN: 16 mL/min — AB (ref >60)
GFR, EST AFRICAN AMERICAN: 17 mL/min — AB (ref >60)
GFR, EST AFRICAN AMERICAN: 16 mL/min — AB (ref >60)
GFR, EST NON AFRICAN AMERICAN: 13 mL/min — AB (ref >60)
GFR, EST NON AFRICAN AMERICAN: 14 mL/min — AB (ref >60)
GFR, EST NON AFRICAN AMERICAN: 14 mL/min — AB (ref >60)
GLUCOSE: 150 mg/dL — AB (ref 70–99)
Glucose, Bld: 222 mg/dL — ABNORMAL HIGH (ref 70–99)
Glucose, Bld: 104 mg/dL — ABNORMAL HIGH (ref 70–99)
Glucose, Bld: 136 mg/dL — ABNORMAL HIGH (ref 70–99)
POTASSIUM: 4.6 mmol/L (ref 3.5–5.1)
Potassium: 2.5 mmol/L — CL (ref 3.5–5.1)
Potassium: 3.5 mmol/L (ref 3.5–5.1)
Potassium: 4.6 mmol/L (ref 3.5–5.1)
SODIUM: 134 mmol/L — AB (ref 135–145)
SODIUM: 135 mmol/L (ref 135–145)
SODIUM: 134 mmol/L — AB (ref 135–145)
Sodium: 133 mmol/L — ABNORMAL LOW (ref 135–145)

## 2018-08-12 LAB — GLUCOSE, CAPILLARY
GLUCOSE-CAPILLARY: 156 mg/dL — AB (ref 70–99)
GLUCOSE-CAPILLARY: 125 mg/dL — AB (ref 70–99)
GLUCOSE-CAPILLARY: 139 mg/dL — AB (ref 70–99)
Glucose-Capillary: 104 mg/dL — ABNORMAL HIGH (ref 70–99)
Glucose-Capillary: 125 mg/dL — ABNORMAL HIGH (ref 70–99)
Glucose-Capillary: 185 mg/dL — ABNORMAL HIGH (ref 70–99)
Glucose-Capillary: 190 mg/dL — ABNORMAL HIGH (ref 70–99)
Glucose-Capillary: 224 mg/dL — ABNORMAL HIGH (ref 70–99)
Glucose-Capillary: 229 mg/dL — ABNORMAL HIGH (ref 70–99)
Glucose-Capillary: 83 mg/dL (ref 70–99)

## 2018-08-12 LAB — URINE CULTURE: SPECIAL REQUESTS: NORMAL

## 2018-08-12 LAB — CBC
HCT: 24.9 % — ABNORMAL LOW (ref 35.0–47.0)
Hemoglobin: 8.4 g/dL — ABNORMAL LOW (ref 12.0–16.0)
MCH: 29.7 pg (ref 26.0–34.0)
MCHC: 34 g/dL (ref 32.0–36.0)
MCV: 87.3 fL (ref 80.0–100.0)
PLATELETS: 221 10*3/uL (ref 150–440)
RBC: 2.85 MIL/uL — ABNORMAL LOW (ref 3.80–5.20)
RDW: 14 % (ref 11.5–14.5)
WBC: 8.2 10*3/uL (ref 3.6–11.0)

## 2018-08-12 LAB — PHOSPHORUS: PHOSPHORUS: 4.1 mg/dL (ref 2.5–4.6)

## 2018-08-12 LAB — ALBUMIN: ALBUMIN: 1.8 g/dL — AB (ref 3.5–5.0)

## 2018-08-12 LAB — MAGNESIUM: Magnesium: 2.7 mg/dL — ABNORMAL HIGH (ref 1.7–2.4)

## 2018-08-12 MED ORDER — POTASSIUM CHLORIDE 10 MEQ/100ML IV SOLN
10.0000 meq | INTRAVENOUS | Status: AC
  Administered 2018-08-12 (×4): 10 meq via INTRAVENOUS
  Filled 2018-08-12 (×4): qty 100

## 2018-08-12 MED ORDER — SODIUM CHLORIDE 0.9 % IV SOLN
1.0000 g | Freq: Once | INTRAVENOUS | Status: AC
  Administered 2018-08-12: 1 g via INTRAVENOUS
  Filled 2018-08-12: qty 10

## 2018-08-12 MED ORDER — INFLUENZA VAC SPLIT QUAD 0.5 ML IM SUSY
0.5000 mL | PREFILLED_SYRINGE | INTRAMUSCULAR | Status: DC
  Filled 2018-08-12: qty 0.5

## 2018-08-12 MED ORDER — LACOSAMIDE 50 MG PO TABS
100.0000 mg | ORAL_TABLET | Freq: Two times a day (BID) | ORAL | Status: DC
  Administered 2018-08-12 – 2018-08-13 (×3): 100 mg via ORAL
  Filled 2018-08-12 (×3): qty 2

## 2018-08-12 MED ORDER — PNEUMOCOCCAL VAC POLYVALENT 25 MCG/0.5ML IJ INJ
0.5000 mL | INJECTION | INTRAMUSCULAR | Status: DC
  Filled 2018-08-12: qty 0.5

## 2018-08-12 MED ORDER — INSULIN DETEMIR 100 UNIT/ML ~~LOC~~ SOLN
15.0000 [IU] | SUBCUTANEOUS | Status: DC
  Administered 2018-08-12 – 2018-08-13 (×2): 15 [IU] via SUBCUTANEOUS
  Filled 2018-08-12 (×2): qty 0.15

## 2018-08-12 MED ORDER — KCL IN DEXTROSE-NACL 20-5-0.45 MEQ/L-%-% IV SOLN
INTRAVENOUS | Status: DC
  Administered 2018-08-12 (×2): via INTRAVENOUS
  Filled 2018-08-12 (×3): qty 1000

## 2018-08-12 MED ORDER — SODIUM CHLORIDE 0.9 % IV SOLN
INTRAVENOUS | Status: DC | PRN
  Administered 2018-08-12: 20 mL via INTRAVENOUS
  Administered 2018-08-12: 250 mL via INTRAVENOUS

## 2018-08-12 NOTE — Progress Notes (Signed)
Pharmacy notified that Levemir ordered previously is needed now per [name] protocol. Pharmacy states they will send up asap.

## 2018-08-12 NOTE — Progress Notes (Signed)
Notified Maggie, NP of critical calcium level at 5.2. She states that she will review labs and enter orders.

## 2018-08-12 NOTE — Progress Notes (Signed)
Report given to [facility], RN- patient being transferred with Tele to 1A room 153 via bed.

## 2018-08-12 NOTE — Progress Notes (Signed)
Maggie, NP notified of critical calcium level of 5.9, acknowledged. New order entered for calcium gluconate at 0645 prior to 0630 lab draw.

## 2018-08-12 NOTE — Progress Notes (Signed)
Pharmacy Electrolyte Monitoring Consult:  Pharmacy consulted to assist in monitoring and replacing electrolytes in this 52 y.o. female admitted on 08/11/2018 with seizures and found to be in [name].  Labs:  Sodium (mmol/L)  Date Value  08/12/2018 135   Potassium (mmol/L)  Date Value  08/12/2018 3.5   Magnesium (mg/dL)  Date Value  08/12/2018 2.7 (H)   Phosphorus (mg/dL)  Date Value  08/12/2018 4.1   Calcium (mg/dL)  Date Value  08/12/2018 5.9 (LL)   Albumin (g/dL)  Date Value  07/10/2018 1.4 (L)    Assessment/Plan: Patient currently on insulin drip transitioned from [name] to [place].  Will replace potassium via fluids: NS + 20 mEq @ 75 ml/hr d5 + 1/2ns + 20 mEq @ 50 ml/mhr And will give KCI 10 mEq IV x 4.  Will recheck K+ w/ q4h BMP and replace as needed. Mg level pending.  ADD: electrolytes wnl except Magnesium slightly above goal range. Will follow BMP q4 hours (last check @ 14:15).    Larene Beach, PharmD  Clinical Pharmacist 08/12/2018

## 2018-08-12 NOTE — Progress Notes (Signed)
[facility] [place] NAME: Alyssa Floyd    MR#:  [id number]  DATE OF BIRTH:  March 28, 1966  SUBJECTIVE:   Patient presented with [name] and seizures.  No seizures overnight.  REVIEW OF SYSTEMS:    Review of Systems  Constitutional: Negative for fever, chills weight loss HENT: Negative for ear pain, nosebleeds, congestion, facial swelling, rhinorrhea, neck pain, neck stiffness and ear discharge.   Respiratory: Negative for cough, shortness of breath, wheezing  Cardiovascular: Negative for chest pain, palpitations and leg swelling.  Gastrointestinal: Negative for heartburn, abdominal pain, vomiting, diarrhea or consitpation Genitourinary: Negative for dysuria, urgency, frequency, hematuria Musculoskeletal: Negative for back pain or joint pain Neurological: Negative for dizziness, seizures, syncope, focal weakness,  numbness and headaches.  Hematological: Does not bruise/bleed easily.  Psychiatric/Behavioral: Negative for hallucinations, confusion, dysphoric mood    Tolerating Diet:yes      DRUG ALLERGIES:   Allergies  Allergen Reactions  . Penicillins Other (See Comments)    Pt reports hallucinations when taking penicillins. >Has patient had a PCN reaction causing immediate rash, facial/tongue/throat swelling, SOB or lightheadedness with hypotension: No Has patient had a PCN reaction causing severe rash involving mucus membranes or skin necrosis: No Has patient had a PCN reaction that required hospitalization No Has patient had a PCN reaction occurring within the last 10 years: No If all of the above answers are "NO", then may proceed with Cephalosporin use.    VITALS:  Blood pressure (!) 154/89, pulse 60, temperature 97.6 F (36.4 C), temperature source Oral, resp. rate 15, height 5\' 5"  (1.651 m), weight 69.9 kg, SpO2 100 %.  PHYSICAL EXAMINATION:  Constitutional: Appears well-developed and well-nourished. No distress. HENT:  Normocephalic. Marland Kitchen Oropharynx is clear and moist.  Eyes: Conjunctivae and EOM are normal. PERRLA, no scleral icterus.  Neck: Normal ROM. Neck supple. No JVD. No tracheal deviation. CVS: RRR, S1/S2 +, no murmurs, no gallops, no carotid bruit.  Pulmonary: Effort and breath sounds normal, no stridor, rhonchi, wheezes, rales.  Abdominal: Soft. BS +,  no distension, tenderness, rebound or guarding.  Musculoskeletal: Normal range of motion. No edema and no tenderness.  Neuro: Alert. CN 2-12 grossly intact. No focal deficits. Skin: Skin is warm and dry. No rash noted. Psychiatric: Normal mood and affect.      LABORATORY PANEL:   CBC Recent Labs  Lab 08/12/18 0205  WBC 8.2  HGB 8.4*  HCT 24.9*  PLT 221   ------------------------------------------------------------------------------------------------------------------  Chemistries  Recent Labs  Lab 08/12/18 0630  NA 135  K 3.5  CL 102  CO2 22  GLUCOSE 104*  BUN 44*  CREATININE 3.60*  CALCIUM 5.9*  MG 2.7*   ------------------------------------------------------------------------------------------------------------------  Cardiac Enzymes No results for input(s): TROPONINI in the last 168 hours. ------------------------------------------------------------------------------------------------------------------  RADIOLOGY:  Ct Head Wo Contrast  Result Date: 08/11/2018 CLINICAL DATA:  Recent seizure activity, history of breast carcinoma EXAM: CT HEAD WITHOUT CONTRAST TECHNIQUE: Contiguous axial images were obtained from the base of the skull through the vertex without intravenous contrast. COMPARISON:  01/29/2016 FINDINGS: Brain: Mild atrophic changes are noted. No findings to suggest acute hemorrhage, acute infarction or space-occupying mass lesion are noted. Vascular: No hyperdense vessel or unexpected calcification. Skull: Normal. Negative for fracture or focal lesion. Sinuses/Orbits: No acute finding. Other: None. IMPRESSION:  Chronic atrophic changes without acute abnormality. Electronically Signed   By: Inez Catalina M.D.   On: 08/11/2018 12:40   Mr Brain Wo Contrast  Result Date: 08/11/2018  CLINICAL DATA:  Acute presentation with seizure. Focal degenerate lie eyes. EXAM: MRI HEAD WITHOUT CONTRAST TECHNIQUE: Multiplanar, multiecho pulse sequences of the brain and surrounding structures were obtained without intravenous contrast. COMPARISON:  CT same day.  MRI 01/29/2016 FINDINGS: Brain: Mild generalized atrophy. No evidence of old or acute focal small or large vessel infarction. No mass lesion, hemorrhage, hydrocephalus or extra-axial collection. Hippocampus on the right is somewhat atrophic compared to the left. Vascular: Major vessels at the base of the brain show flow. Skull and upper cervical spine: Negative Sinuses/Orbits: Clear/normal Other: None IMPRESSION: No acute intracranial finding. 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 Electronically Signed   By: Nelson Chimes M.D.   On: 08/11/2018 17:08     ASSESSMENT AND PLAN:   52 year old female with a history of diabetes who presented to the emergency room in [name] and witnessed tonic-clonic seizures.    1.  [name]: Patient's [name] has resolved. Continue current management with ADA diet Diabetes coordinator consultation  2.  New onset seizure: Head CT and MRI of the brain shows no acute findings or etiology for seizures There is some hippocampal atrophy Seizure likely provoked due to multiple medical abnormalities from [name] EEG for a.m. Continue Vimpat 100 mg p.o. twice daily as per neurology Continue PRN Ativan and seizure precautions  3.  Acute kidney injury on chronic kidney disease stage IV: Creatinine remains near baseline It has improved with IV fluids Nephrology consult 4.  Hyperlipidemia: Continue atorvastatin  5.  History of breast cancer on tamoxifen  6.  Essential hypertension: Continue Norvasc  7.  Hypocalcemia: Treated with calcium  gluconate this morning Management plans discussed with the patient and she is in agreement.  CODE STATUS: Full  TOTAL TIME TAKING CARE OF THIS PATIENT: 30 minutes.     POSSIBLE D/C tomorrow, DEPENDING ON CLINICAL CONDITION.   Natiya Seelinger M.D on 08/12/2018 at 11:13 AM  Between 7am to 6pm - Pager - [phone] After 6pm go to [email or web address] - password [email or web address] [place] [facility]  Office  [phone]  CC: Primary care physician; Patient, No Pcp Per  Note: This dictation was prepared with Dragon dictation along with smaller phrase technology. Any transcriptional errors that result from this process are unintentional.

## 2018-08-12 NOTE — Progress Notes (Signed)
Maggie, NP notified of critical potassium level of 2.5 and critical calcium level of 5.4, acknowledged. Orders to be entered.

## 2018-08-12 NOTE — Plan of Care (Signed)
  Problem: Education: Goal: Knowledge of General Education information will improve Description Including pain rating scale, medication(s)/side effects and non-pharmacologic comfort measures Outcome: Progressing   Problem: Health Behavior/Discharge Planning: Goal: Ability to manage health-related needs will improve Outcome: Progressing   Problem: Clinical Measurements: Goal: Ability to maintain clinical measurements within normal limits will improve Outcome: Progressing Goal: Diagnostic test results will improve Outcome: Progressing Goal: Cardiovascular complication will be avoided Outcome: Progressing   Problem: Activity: Goal: Risk for activity intolerance will decrease Outcome: Progressing   Problem: Nutrition: Goal: Adequate nutrition will be maintained Outcome: Progressing   Problem: Pain Managment: Goal: General experience of comfort will improve Outcome: Progressing   Problem: Education: Goal: Ability to describe self-care measures that may prevent or decrease complications (Diabetes Survival Skills Education) will improve Outcome: Progressing   Problem: Metabolic: Goal: Ability to maintain appropriate glucose levels will improve Outcome: Progressing   Problem: Nutritional: Goal: Maintenance of adequate nutrition will improve Outcome: Progressing

## 2018-08-12 NOTE — Progress Notes (Addendum)
Pharmacy Electrolyte Monitoring Consult:  Pharmacy consulted to assist in monitoring and replacing electrolytes in this 52 y.o. female admitted on 08/11/2018 with seizures and found to be in [name].  Labs:  Sodium (mmol/L)  Date Value  08/12/2018 134 (L)   Potassium (mmol/L)  Date Value  08/12/2018 2.5 (LL)   Magnesium (mg/dL)  Date Value  08/11/2018 1.5 (L)   Phosphorus (mg/dL)  Date Value  08/12/2018 4.1   Calcium (mg/dL)  Date Value  08/12/2018 5.4 (LL)   Albumin (g/dL)  Date Value  07/10/2018 1.4 (L)    Assessment/Plan: Patient currently on insulin drip transitioned from [name] to [place].  Will replace potassium via fluids: NS + 20 mEq @ 75 ml/hr d5 + 1/2ns + 20 mEq @ 50 ml/mhr And will give KCI 10 mEq IV x 4.  Will recheck K+ w/ q4h BMP and replace as needed. Mg level pending.  Tobie Lords, PharmD, BCPS Clinical Pharmacist 08/12/2018

## 2018-08-12 NOTE — Progress Notes (Signed)
Follow up - Critical Care Medicine Note  Patient Details:    Alyssa Floyd is an 52 y.o. female.with a past medical history of hypertension, hyperlipidemia, hypothyroidism, breast cancer, diabetes mellitus with diabetic nephropathy, presented with seizure both in EMS and in the emergency department. She had been given 2 mg of Ativan by ED. Blood sugar was noted to be elevated at 939, CO2 was reduced at 19 with an elevated anion gap consistent with a diagnosis of [name]. Started on Vimpat  Lines, Airways, Drains: Implanted Port Left Chest (Active)     External Urinary Catheter (Active)  Collection Container Dedicated Suction Canister 08/12/2018  6:00 AM  Securement Method Other (Comment) 08/12/2018  6:00 AM  Intervention Equipment Changed 08/12/2018  6:00 AM  Output (mL) 150 mL 08/12/2018  3:59 AM    Anti-infectives:  Anti-infectives (From admission, onward)   Start     Dose/Rate Route Frequency Ordered Stop   08/11/18 1500  cefTRIAXone (ROCEPHIN) 1 g in sodium chloride 0.9 % 100 mL IVPB     1 g 200 mL/hr over 30 Minutes Intravenous Every 24 hours 08/11/18 1414        Microbiology: Results for orders placed or performed during the hospital encounter of 08/11/18  MRSA PCR Screening     Status: None   Collection Time: 08/11/18  2:52 PM  Result Value Ref Range Status   MRSA by PCR NEGATIVE NEGATIVE Final    Comment:        The GeneXpert MRSA Assay (FDA approved for NASAL specimens only), is one component of a comprehensive MRSA colonization surveillance program. It is not intended to diagnose MRSA infection nor to guide or monitor treatment for MRSA infections. Performed at [facility], [address] [place], [place] [address]    Studies: Ct Head Wo Contrast  Result Date: 08/11/2018 CLINICAL DATA:  Recent seizure activity, history of breast carcinoma EXAM: CT HEAD WITHOUT CONTRAST TECHNIQUE: Contiguous axial images were obtained from the base of the skull through the  vertex without intravenous contrast. COMPARISON:  01/29/2016 FINDINGS: Brain: Mild atrophic changes are noted. No findings to suggest acute hemorrhage, acute infarction or space-occupying mass lesion are noted. Vascular: No hyperdense vessel or unexpected calcification. Skull: Normal. Negative for fracture or focal lesion. Sinuses/Orbits: No acute finding. Other: None. IMPRESSION: Chronic atrophic changes without acute abnormality. Electronically Signed   By: Inez Catalina M.D.   On: 08/11/2018 12:40   Mr Brain Wo Contrast  Result Date: 08/11/2018 CLINICAL DATA:  Acute presentation with seizure. Focal degenerate lie eyes. EXAM: MRI HEAD WITHOUT CONTRAST TECHNIQUE: Multiplanar, multiecho pulse sequences of the brain and surrounding structures were obtained without intravenous contrast. COMPARISON:  CT same day.  MRI 01/29/2016 FINDINGS: Brain: Mild generalized atrophy. No evidence of old or acute focal small or large vessel infarction. No mass lesion, hemorrhage, hydrocephalus or extra-axial collection. Hippocampus on the right is somewhat atrophic compared to the left. Vascular: Major vessels at the base of the brain show flow. Skull and upper cervical spine: Negative Sinuses/Orbits: Clear/normal Other: None IMPRESSION: No acute intracranial finding. 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 Electronically Signed   By: Nelson Chimes M.D.   On: 08/11/2018 17:08    Consults: Treatment Team:  Catarina Hartshorn, MD Alexis Goodell, MD   Subjective:    Overnight Issues:  Did well overnight no further evidence of seizures noted  Objective:  Vital signs for last 24 hours: Temp:  [97.6 F (36.4 C)-98 F (36.7 C)]  97.6 F (36.4 C) (09/15 0800) Pulse Rate:  [60-87] 60 (09/15 0500) Resp:  [0-26] 15 (09/15 0644) BP: (117-184)/(71-99) 164/90 (09/15 0644) SpO2:  [98 %-100 %] 100 % (09/15 0500) Weight:  [69.9 kg-72.6 kg] 69.9 kg (09/14 1442)  Hemodynamic parameters for last 24 hours:     Intake/Output from previous day: 09/14 0701 - 09/15 0700 In: 2256.5 [I.V.:605.9; IV Piggyback:1650.6] Out: 500 [Urine:500]  Intake/Output this shift: Total I/O In: 135.3 [I.V.:46.8; IV Piggyback:88.5] Out: -   Vent settings for last 24 hours:    Physical Exam:   Vital signs:       Please see the above listed vital signs HEENT:           Extraocular muscles intact, no oral lesions appreciated, trachea midline, no thyromegaly appreciated, some facial asymmetry with left-sided droop noted Cardiovascular:           Regular rate and rhythm Pulmonary:      Clear to auscultation Abdominal:      Positive bowel sounds, soft exam Extremities:     No clubbing, cyanosis or edema noted Neurologic:      Cranial nerves grossly intact, moves all extremities with adequate strength, only abnormality noted is some subtle left-sided facial changes Psychiatric:      Normal affect Cutaneous:      No rashes or lesions noted   Assessment/Plan:  [name]. Anion gap has closed 11, blood sugar is 104, transition to diet and insulin regimen  Hypokalemia being replaced  Hypocalcemia being replaced  Seizure. Patient was started on Vimpat after was given Ativan bolus per neurology, pending EEG. Has had an initial negative CT scan of the head other than chronic atrophic changes. MRI of the brain was negative Pending neurology  Stable for floor transfer   Kruz Chiu 08/12/2018  *Care during the described time interval was provided by me and/or other providers on the critical care team.  I have reviewed this patient's available data, including medical history, events of note, physical examination and test results as part of my evaluation.

## 2018-08-12 NOTE — Progress Notes (Signed)
Insulin gtt discontinued 2 hours post Levemir being administered. NS prn switched to Left arm PIV for 2nd IV potassium dose. Pt tolerating well. VSS, Denies pain.

## 2018-08-12 NOTE — Progress Notes (Signed)
Notified Alyssa Floyd in pharmacy that [place] is needed to give now per [name] protocol. He states that he will send it up asap.

## 2018-08-12 NOTE — Consult Note (Signed)
Reason for Consult:Seizure Referring Physician: [place]  CC: Seizure  HPI: Alyssa Floyd is an 52 y.o. female with a history of DM who reports that for the past few days has had intermittent episodes of right arm jerking.  On yesterday was noted to have elevated blood sugar at home and presented for evaluation.  While being work up in ED had a witnessed tonic-clonic seizure.  Found to be in [name] and admitted at that time.    Past Medical History:  Diagnosis Date  . Breast cancer ([place])   . 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 Diabetes mellitus ([place])   . Diabetic nephropathy ([place])   . Hyperlipidemia   . Hypertension   . Nephrotic syndrome    diabetic nephropathy, biopsy on 01/06/16 at [facility]  . Normocytic anemia   . Subclinical hypothyroidism   . Tobacco use   . Vitamin D deficiency     Past Surgical History:  Procedure Laterality Date  . BREAST SURGERY    . PARTIAL HYSTERECTOMY      Family History  Problem Relation Age of Onset  . Diabetes Unknown     Social History:  reports that she has been smoking cigarettes. She has been smoking about 0.50 packs per day. She has never used smokeless tobacco. She reports that she does not drink alcohol or use drugs.  Allergies  Allergen Reactions  . Penicillins Other (See Comments)    Pt reports hallucinations when taking penicillins. >Has patient had a PCN reaction causing immediate rash, facial/tongue/throat swelling, SOB or lightheadedness with hypotension: No Has patient had a PCN reaction causing severe rash involving mucus membranes or skin necrosis: No Has patient had a PCN reaction that required hospitalization No Has patient had a PCN reaction occurring within the last 10 years: No If all of the above answers are "NO", then may proceed with Cephalosporin use.    Medications:  I have reviewed the patient's current medications. Prior to Admission:  Medications Prior to Admission  Medication Sig  Dispense Refill Last Dose  . amLODipine (NORVASC) 5 MG tablet Take 5 mg by mouth daily.   08/10/2018 at Unknown time  . atorvastatin (LIPITOR) 40 MG tablet Take 0.5 tablets (20 mg total) by mouth daily.  1 08/10/2018 at Unknown time  . insulin aspart (NOVOLOG) 100 UNIT/ML injection Inject 4 Units into the skin 3 (three) times daily with meals. Please take fingersticks reading more than 200 10 mL 0 08/09/2018 at Unknown time  . insulin glargine (LANTUS) 100 UNIT/ML injection Inject 0.15 mLs (15 Units total) into the skin daily. (Patient taking differently: Inject 11 Units into the skin daily. ) 10 mL 11 08/10/2018 at 2100  . potassium chloride SA (K-DUR,KLOR-CON) 20 MEQ tablet Take 20 mEq by mouth daily.   08/10/2018 at Unknown time  . tamoxifen (NOLVADEX) 20 MG tablet Take 20 mg by mouth daily.   08/10/2018 at Unknown time  . torsemide (DEMADEX) 100 MG tablet Take 100 mg by mouth daily.  11 08/10/2018 at Unknown time  . Vitamin D, Ergocalciferol, (DRISDOL) 50000 units CAPS capsule Take 50,000 Units by mouth every 7 (seven) days.  1 08/04/2018 at Unknown   Scheduled: . amLODipine  5 mg Oral Daily  . atorvastatin  20 mg Oral q1800  . heparin  5,000 Units Subcutaneous Q8H  . [START ON 08/13/2018] Influenza vac split quadrivalent PF  0.5 mL Intramuscular Tomorrow-1000  . insulin aspart  0-15 Units Subcutaneous TID WC  .  insulin aspart  0-5 Units Subcutaneous QHS  . insulin aspart  3 Units Subcutaneous TID WC  . insulin detemir  15 Units Subcutaneous Q24H  . Melatonin  5 mg Oral QHS  . [START ON 08/13/2018] pneumococcal 23 valent vaccine  0.5 mL Intramuscular Tomorrow-1000  . tamoxifen  20 mg Oral Daily    ROS: History obtained from the patient  General ROS: negative for - chills, fatigue, fever, night sweats, weight gain or weight loss Psychological ROS: negative for - behavioral disorder, hallucinations, memory difficulties, mood swings or suicidal ideation Ophthalmic ROS: negative for - blurry  vision, double vision, eye pain or loss of vision ENT ROS: negative for - epistaxis, nasal discharge, oral lesions, sore throat, tinnitus or vertigo Allergy and Immunology ROS: negative for - hives or itchy/watery eyes Hematological and Lymphatic ROS: negative for - bleeding problems, bruising or swollen lymph nodes Endocrine ROS: negative for - galactorrhea, hair pattern changes, polydipsia/polyuria or temperature intolerance Respiratory ROS: negative for - cough, hemoptysis, shortness of breath or wheezing Cardiovascular ROS: negative for - chest pain, dyspnea on exertion, edema or irregular heartbeat Gastrointestinal ROS: negative for - abdominal pain, diarrhea, hematemesis, nausea/vomiting or stool incontinence Genito-Urinary ROS: negative for - dysuria, hematuria, incontinence or urinary frequency/urgency Musculoskeletal ROS: right leg pain Neurological ROS: as noted in HPI Dermatological ROS: negative for rash and skin lesion changes  Physical Examination: Blood pressure (!) 164/90, pulse 60, temperature 97.6 F (36.4 C), temperature source Oral, resp. rate 15, height 5\' 5"  (1.651 m), weight 69.9 kg, SpO2 100 %.  HEENT-  Normocephalic, no lesions, without obvious abnormality.  Normal external eye and conjunctiva.  Normal TM's bilaterally.  Normal auditory canals and external ears. Normal external nose, mucus membranes and septum.  Normal pharynx. Cardiovascular- S1, S2 normal, pulses palpable throughout   Lungs- chest clear, no wheezing, rales, normal symmetric air entry Abdomen- soft, non-tender; bowel sounds normal; no masses,  no organomegaly Extremities- no edema Lymph-no adenopathy palpable Musculoskeletal-no joint tenderness, deformity or swelling Skin-warm and dry, no hyperpigmentation, vitiligo, or suspicious lesions  Neurological Examination   Mental Status: Alert, oriented, thought content appropriate.  Speech fluent without evidence of aphasia.  Able to follow 3 step  commands without difficulty. Cranial Nerves: II: Discs flat bilaterally; Visual fields grossly normal, pupils equal, round, reactive to light and accommodation III,IV, VI: mild left ptosis, extra-ocular motions intact bilaterally V,VII: smile symmetric, facial light touch sensation normal bilaterally VIII: hearing normal bilaterally IX,X: gag reflex present XI: bilateral shoulder shrug XII: midline tongue extension Motor: Right : Upper extremity   5/5    Left:     Upper extremity   5/5  Lower extremity   5/5     Lower extremity   5/5 Tone and bulk:normal tone throughout; no atrophy noted Sensory: Pinprick and light touch intact throughout, bilaterally Deep Tendon Reflexes: 1+ and symmetric with absent AJ's bilaterally Plantars: Right: mute   Left: mute Cerebellar: Normal finger-to-nose and normal heel-to-shin testing bilaterally with some RLE limitation due to pain Gait: not tested due to safety concerns    Laboratory Studies:   Basic Metabolic Panel: Recent Labs  Lab 08/11/18 1217 08/11/18 1732 08/11/18 2215 08/12/18 0205 08/12/18 0630  NA 127* 135 137 134* 135  K 3.4* 2.7* 3.0* 2.5* 3.5  CL 88* 98 103 102 102  CO2 19* 24 24 19* 22  GLUCOSE 939* 351* 93 222* 104*  BUN 53* 51* 47* 47* 44*  CREATININE 4.71* 4.25* 3.83* 3.83* 3.60*  CALCIUM  5.1* 5.2* 5.2* 5.4* 5.9*  MG  --   --  1.5*  --  2.7*  PHOS  --   --   --  4.1  --     Liver Function Tests: No results for input(s): AST, ALT, ALKPHOS, BILITOT, PROT, ALBUMIN in the last 168 hours. No results for input(s): LIPASE, AMYLASE in the last 168 hours. No results for input(s): AMMONIA in the last 168 hours.  CBC: Recent Labs  Lab 08/11/18 1217 08/12/18 0205  WBC 7.9 8.2  HGB 9.3* 8.4*  HCT 28.6* 24.9*  MCV 91.3 87.3  PLT 243 221    Cardiac Enzymes: No results for input(s): CKTOTAL, CKMB, CKMBINDEX, TROPONINI in the last 168 hours.  BNP: Invalid input(s): POCBNP  CBG: Recent Labs  Lab 08/12/18 0153  08/12/18 0251 08/12/18 0354 08/12/18 0502 08/12/18 0744  GLUCAP 224* 185* 156* 125* 47    Microbiology: Results for orders placed or performed during the hospital encounter of 08/11/18  MRSA PCR Screening     Status: None   Collection Time: 08/11/18  2:52 PM  Result Value Ref Range Status   MRSA by PCR NEGATIVE NEGATIVE Final    Comment:        The GeneXpert MRSA Assay (FDA approved for NASAL specimens only), is one component of a comprehensive MRSA colonization surveillance program. It is not intended to diagnose MRSA infection nor to guide or monitor treatment for MRSA infections. Performed at [facility], [address] [place], [place] [address]     Coagulation Studies: No results for input(s): LABPROT, INR in the last 72 hours.  Urinalysis:  Recent Labs  Lab 08/11/18 1217  COLORURINE STRAW*  LABSPEC 1.013  PHURINE 6.0  GLUCOSEU >=500*  HGBUR SMALL*  BILIRUBINUR NEGATIVE  KETONESUR NEGATIVE  PROTEINUR 100*  NITRITE NEGATIVE  LEUKOCYTESUR TRACE*    Lipid Panel:  No results found for: CHOL, TRIG, HDL, CHOLHDL, VLDL, LDLCALC  HgbA1C: No results found for: HGBA1C  Urine Drug Screen:      Component Value Date/Time   LABOPIA NONE DETECTED 08/11/2018 1217   LABOPIA NONE DETECTED 01/29/2016 2316   COCAINSCRNUR NONE DETECTED 08/11/2018 1217   LABBENZ NONE DETECTED 08/11/2018 1217   LABBENZ NONE DETECTED 01/29/2016 2316   AMPHETMU NONE DETECTED 08/11/2018 1217   AMPHETMU NONE DETECTED 01/29/2016 2316   THCU NONE DETECTED 08/11/2018 1217   THCU NONE DETECTED 01/29/2016 2316   LABBARB NONE DETECTED 08/11/2018 1217   LABBARB NONE DETECTED 01/29/2016 2316    Alcohol Level: No results for input(s): ETH in the last 168 hours.  Other results: EKG: sinus rhythm at 89 bpm.  Imaging: Ct Head Wo Contrast  Result Date: 08/11/2018 CLINICAL DATA:  Recent seizure activity, history of breast carcinoma EXAM: CT HEAD WITHOUT CONTRAST TECHNIQUE:  Contiguous axial images were obtained from the base of the skull through the vertex without intravenous contrast. COMPARISON:  01/29/2016 FINDINGS: Brain: Mild atrophic changes are noted. No findings to suggest acute hemorrhage, acute infarction or space-occupying mass lesion are noted. Vascular: No hyperdense vessel or unexpected calcification. Skull: Normal. Negative for fracture or focal lesion. Sinuses/Orbits: No acute finding. Other: None. IMPRESSION: Chronic atrophic changes without acute abnormality. Electronically Signed   By: Inez Catalina M.D.   On: 08/11/2018 12:40   Mr Brain Wo Contrast  Result Date: 08/11/2018 CLINICAL DATA:  Acute presentation with seizure. Focal degenerate lie eyes. EXAM: MRI HEAD WITHOUT CONTRAST TECHNIQUE: Multiplanar, multiecho pulse sequences of the brain and surrounding structures were  obtained without intravenous contrast. COMPARISON:  CT same day.  MRI 01/29/2016 FINDINGS: Brain: Mild generalized atrophy. No evidence of old or acute focal small or large vessel infarction. No mass lesion, hemorrhage, hydrocephalus or extra-axial collection. Hippocampus on the right is somewhat atrophic compared to the left. Vascular: Major vessels at the base of the brain show flow. Skull and upper cervical spine: Negative Sinuses/Orbits: Clear/normal Other: None IMPRESSION: No acute intracranial finding. 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 Electronically Signed   By: Nelson Chimes M.D.   On: 08/11/2018 17:08     Assessment/Plan: 52 year old female presenting in [name].  Noted to have GTC seizure in the ED.  Per her description may have been having some focal seizures at home prior to presentation.  No previous history of seizures.  Head CT and MRI of the brain reviewed and neither shows any acute changes or etiology for seizures.otehr than some hippocampal atrophy.  Seizures likely provoked due to multiple metabolic abnormalities noted in presentation.  Patient loaded and  started on maintenance Vimpat.  If remaining work up unremarkable would continue Vimpat for the short term but anticipate that patient will be able to be weaned off in the future.  Recommendations: 1. EEG 2. Continue Vimpat at 100mg  BID.  May change to po. Should be on maintenance of 100mg  BID  3. Seizure precautions 4. Ativan prn seizure activity  Alexis Goodell, MD Neurology [phone] 08/12/2018, 9:55 AM

## 2018-08-13 ENCOUNTER — Inpatient Hospital Stay

## 2018-08-13 LAB — BASIC METABOLIC PANEL
ANION GAP: 10 (ref 5–15)
BUN: 41 mg/dL — ABNORMAL HIGH (ref 6–20)
CALCIUM: 6 mg/dL — AB (ref 8.9–10.3)
CO2: 17 mmol/L — ABNORMAL LOW (ref 22–32)
Chloride: 103 mmol/L (ref 98–111)
Creatinine, Ser: 3.64 mg/dL — ABNORMAL HIGH (ref 0.44–1.00)
GFR calc Af Amer: 16 mL/min — ABNORMAL LOW (ref >60)
GFR, EST NON AFRICAN AMERICAN: 13 mL/min — AB (ref >60)
GLUCOSE: 430 mg/dL — AB (ref 70–99)
Potassium: 5 mmol/L (ref 3.5–5.1)
Sodium: 130 mmol/L — ABNORMAL LOW (ref 135–145)

## 2018-08-13 LAB — GLUCOSE, CAPILLARY
GLUCOSE-CAPILLARY: 413 mg/dL — AB (ref 70–99)
GLUCOSE-CAPILLARY: 412 mg/dL — AB (ref 70–99)
GLUCOSE-CAPILLARY: 373 mg/dL — AB (ref 70–99)
GLUCOSE-CAPILLARY: 106 mg/dL — AB (ref 70–99)
GLUCOSE-CAPILLARY: 215 mg/dL — AB (ref 70–99)
Glucose-Capillary: >600 mg/dL (ref 70–99)
Glucose-Capillary: 216 mg/dL — ABNORMAL HIGH (ref 70–99)
Glucose-Capillary: 156 mg/dL — ABNORMAL HIGH (ref 70–99)
Glucose-Capillary: 107 mg/dL — ABNORMAL HIGH (ref 70–99)

## 2018-08-13 LAB — PHOSPHORUS: Phosphorus: 3.5 mg/dL (ref 2.5–4.6)

## 2018-08-13 LAB — HEMOGLOBIN A1C
Hgb A1c MFr Bld: 13.6 % — ABNORMAL HIGH (ref 4.8–5.6)
MEAN PLASMA GLUCOSE: 344 mg/dL

## 2018-08-13 LAB — MAGNESIUM: Magnesium: 2.5 mg/dL — ABNORMAL HIGH (ref 1.7–2.4)

## 2018-08-13 MED ORDER — INSULIN DETEMIR 100 UNIT/ML ~~LOC~~ SOLN
15.0000 [IU] | Freq: Every day | SUBCUTANEOUS | Status: DC
  Filled 2018-08-13: qty 0.15

## 2018-08-13 MED ORDER — CEPHALEXIN 250 MG PO CAPS: 250.0000 mg | ORAL_CAPSULE | Freq: Two times a day (BID) | ORAL | 0 refills | Status: DC

## 2018-08-13 MED ORDER — HYDRALAZINE HCL 20 MG/ML IJ SOLN
10.0000 mg | Freq: Four times a day (QID) | INTRAMUSCULAR | Status: DC | PRN
  Administered 2018-08-13: 10 mg via INTRAVENOUS
  Filled 2018-08-13: qty 1

## 2018-08-13 MED ORDER — LACTATED RINGERS IV SOLN
INTRAVENOUS | Status: DC
  Administered 2018-08-13: 03:00:00 via INTRAVENOUS

## 2018-08-13 MED ORDER — INSULIN ASPART 100 UNIT/ML ~~LOC~~ SOLN
4.0000 [IU] | Freq: Three times a day (TID) | SUBCUTANEOUS | Status: DC
  Administered 2018-08-13 (×2): 4 [IU] via SUBCUTANEOUS
  Filled 2018-08-13 (×3): qty 1

## 2018-08-13 MED ORDER — INSULIN ASPART 100 UNIT/ML ~~LOC~~ SOLN
10.0000 [IU] | Freq: Once | SUBCUTANEOUS | Status: AC
  Administered 2018-08-13: 10 [IU] via SUBCUTANEOUS
  Filled 2018-08-13: qty 1

## 2018-08-13 MED ORDER — INSULIN GLARGINE 100 UNIT/ML ~~LOC~~ SOLN: 15.0000 [IU] | Freq: Every day | SUBCUTANEOUS | 11 refills | Status: DC

## 2018-08-13 MED ORDER — INSULIN ASPART 100 UNIT/ML ~~LOC~~ SOLN
10.0000 [IU] | Freq: Once | SUBCUTANEOUS | Status: AC
  Administered 2018-08-13: 10 [IU] via INTRAVENOUS
  Filled 2018-08-13 (×2): qty 0.1

## 2018-08-13 MED ORDER — LACOSAMIDE 100 MG PO TABS: 100.0000 mg | ORAL_TABLET | Freq: Two times a day (BID) | ORAL | 0 refills | Status: DC

## 2018-08-13 MED ORDER — INSULIN DETEMIR 100 UNIT/ML ~~LOC~~ SOLN
20.0000 [IU] | SUBCUTANEOUS | Status: DC
  Filled 2018-08-13: qty 0.2

## 2018-08-13 NOTE — Progress Notes (Signed)
CBG 412 MD notified. Received new order

## 2018-08-13 NOTE — Progress Notes (Signed)
PT Cancellation Note  Patient Details Name: Alyssa Floyd MRN: [id number] DOB: 1966-09-16   Cancelled Treatment:    Reason Eval/Treat Not Completed: Patient at procedure or test/unavailable.  Try again at another time.     Ramond Dial 08/13/2018, 1:55 PM   Mee Hives, PT MS Acute Rehab Dept. Number: [place]

## 2018-08-13 NOTE — Progress Notes (Addendum)
Inpatient Diabetes Program Recommendations  AACE/ADA: New Consensus Statement on Inpatient Glycemic Control (2019)  Target Ranges:  Prepandial:   less than 140 mg/dL      Peak postprandial:   less than 180 mg/dL (1-2 hours)      Critically ill patients:  140 - 180 mg/dL   Results for Alyssa Floyd, Alyssa Floyd (MRN [id number]) as of 08/13/2018 07:35  Ref. Range 08/12/2018 07:44 08/12/2018 10:11 08/12/2018 12:14 08/12/2018 17:02 08/12/2018 20:57 08/13/2018 02:03 08/13/2018 03:53 08/13/2018 05:49  Glucose-Capillary Latest Ref Range: 70 - 99 mg/dL 83 104 (H) 139 (H) 125 (H) 190 (H) 413 (H) 412 (H) 373 (H)   Review of Glycemic Control  Diabetes history: DM1 Outpatient Diabetes medications: Lantus 11 units daily, Novolog 4 units TID with meals Current orders for Inpatient glycemic control: Lantus 15 units Q24H, Novolog 3 units TID with meals for meal coverage, Novolog 0-15 units TID with meals, Novolog 0-5 units QHS   NOTE: Noted consult; chart reviewed. Diabetes Coordinator talked with patient at length on 07/09/18 regarding DM control (see note for details) during last hospitalization. Patient has DM1 and is followed by PCP for DM management. Patient had a scheduled appointment with Endocrinologist on 07/13/18; however patient did NOT make it to the appointment (per Dr. Oliva Bustard office note on 07/25/18) and has a new appointment scheduled on 08/22/18@2pm  with Endocrine Fellow at [facility]. Patient's most current A1C per Care Everywhere is 14.3% indicating very poor glycemic control. Patient was admitted with seizures and [name] this admission and was on insulin drip but has transitioned to IV insulin on 08/12/18. Glucose up to 413 mg/dl today at 2:03 am. Unclear why glucose increased so significantly overnight. Will follow with current orders and make further recommendations if needed as more data is collected.  Addendum 08/13/18@11 :45-Spoke with patient about diabetes and home regimen for diabetes control.  Patient reports that she is followed by PCP for diabetes management. Inquired about seeing an Endocrinologist and patient states she missed the last appointment but it was rescheduled but she did not know the date.  Informed patient that per PCP office notes, she has an appointment on 08/22/18 at 2pm with Endocrine Fellow. Encouraged patient to be sure to go to the appointment so she can get assistance with improving DM control.   Patient reports that she has Lantus and Novolog insulin at home and she has been taking Lantus 11 units daily and Novolog 3 units with meals.  Discussed current inpatient glycemic control orders and asked patient to pay attention to discharge instructions in case the insulin dosages were changed at time of discharge. Patient reports that her glucose at home over the past 2 weeks has been high, but not able to provide values when asked to elaborate on glucose trends.   Reviewed glucose and A1C goals. Discussed importance of checking CBGs and maintaining good CBG control to prevent long-term and short-term complications.  Encouraged patient to check her glucose 4 times per day (before meals and at bedtime), to keep a log book of glucose readings and insulin taken which she will need to take to doctor appointments. Explained how the doctor she follows up with can use the log book to continue to make insulin adjustments if needed. Patient verbalized understanding of information discussed and she states that she has no further questions at this time related to diabetes.  Thanks, Barnie Alderman, RN, MSN, CDE Diabetes Coordinator Inpatient Diabetes Program [phone] (Team Pager from 8am to 5pm)

## 2018-08-13 NOTE — Progress Notes (Signed)
EEG completed, results pending. 

## 2018-08-13 NOTE — Progress Notes (Signed)
[facility] [place] NAME: Alyssa Floyd    MR#:  [id number]  DATE OF BIRTH:  08/20/1966  SUBJECTIVE:     No seizures overnight.  REVIEW OF SYSTEMS:    Review of Systems  Constitutional: Negative for fever, chills weight loss HENT: Negative for ear pain, nosebleeds, congestion, facial swelling, rhinorrhea, neck pain, neck stiffness and ear discharge.   Respiratory: Negative for cough, shortness of breath, wheezing  Cardiovascular: Negative for chest pain, palpitations and leg swelling.  Gastrointestinal: Negative for heartburn, abdominal pain, vomiting, diarrhea or consitpation Genitourinary: Negative for dysuria, urgency, frequency, hematuria Musculoskeletal: Negative for back pain or joint pain Neurological: Negative for dizziness, seizures, syncope, focal weakness,  numbness and headaches.  Hematological: Does not bruise/bleed easily.  Psychiatric/Behavioral: Negative for hallucinations, confusion, dysphoric mood    Tolerating Diet:yes      DRUG ALLERGIES:   Allergies  Allergen Reactions  . Penicillins Other (See Comments)    Pt reports hallucinations when taking penicillins. >Has patient had a PCN reaction causing immediate rash, facial/tongue/throat swelling, SOB or lightheadedness with hypotension: No Has patient had a PCN reaction causing severe rash involving mucus membranes or skin necrosis: No Has patient had a PCN reaction that required hospitalization No Has patient had a PCN reaction occurring within the last 10 years: No If all of the above answers are "NO", then may proceed with Cephalosporin use.    VITALS:  Blood pressure 133/77, pulse 79, temperature 97.7 F (36.5 C), temperature source Oral, resp. rate 20, height 5\' 5"  (1.651 m), weight 70.7 kg, SpO2 99 %.  PHYSICAL EXAMINATION:  Constitutional: Appears well-developed and well-nourished. No distress. HENT: Normocephalic. Marland Kitchen Oropharynx is clear and moist.   Eyes: Conjunctivae and EOM are normal. PERRLA, no scleral icterus.  Neck: Normal ROM. Neck supple. No JVD. No tracheal deviation. CVS: RRR, S1/S2 +, no murmurs, no gallops, no carotid bruit.  Pulmonary: Effort and breath sounds normal, no stridor, rhonchi, wheezes, rales.  Abdominal: Soft. BS +,  no distension, tenderness, rebound or guarding.  Musculoskeletal: Normal range of motion. No edema and no tenderness.  Neuro: Alert. CN 2-12 grossly intact. No focal deficits. Skin: Skin is warm and dry. No rash noted. Psychiatric: Normal mood and affect.      LABORATORY PANEL:   CBC Recent Labs  Lab 08/12/18 0205  WBC 8.2  HGB 8.4*  HCT 24.9*  PLT 221   ------------------------------------------------------------------------------------------------------------------  Chemistries  Recent Labs  Lab 08/13/18 0220  NA 130*  K 5.0  CL 103  CO2 17*  GLUCOSE 430*  BUN 41*  CREATININE 3.64*  CALCIUM 6.0*  MG 2.5*   ------------------------------------------------------------------------------------------------------------------  Cardiac Enzymes No results for input(s): TROPONINI in the last 168 hours. ------------------------------------------------------------------------------------------------------------------  RADIOLOGY:  Ct Head Wo Contrast  Result Date: 08/11/2018 CLINICAL DATA:  Recent seizure activity, history of breast carcinoma EXAM: CT HEAD WITHOUT CONTRAST TECHNIQUE: Contiguous axial images were obtained from the base of the skull through the vertex without intravenous contrast. COMPARISON:  01/29/2016 FINDINGS: Brain: Mild atrophic changes are noted. No findings to suggest acute hemorrhage, acute infarction or space-occupying mass lesion are noted. Vascular: No hyperdense vessel or unexpected calcification. Skull: Normal. Negative for fracture or focal lesion. Sinuses/Orbits: No acute finding. Other: None. IMPRESSION: Chronic atrophic changes without acute  abnormality. Electronically Signed   By: Inez Catalina M.D.   On: 08/11/2018 12:40   Mr Brain Wo Contrast  Result Date: 08/11/2018 CLINICAL DATA:  Acute presentation with  seizure. Focal degenerate lie eyes. EXAM: MRI HEAD WITHOUT CONTRAST TECHNIQUE: Multiplanar, multiecho pulse sequences of the brain and surrounding structures were obtained without intravenous contrast. COMPARISON:  CT same day.  MRI 01/29/2016 FINDINGS: Brain: Mild generalized atrophy. No evidence of old or acute focal small or large vessel infarction. No mass lesion, hemorrhage, hydrocephalus or extra-axial collection. Hippocampus on the right is somewhat atrophic compared to the left. Vascular: Major vessels at the base of the brain show flow. Skull and upper cervical spine: Negative Sinuses/Orbits: Clear/normal Other: None IMPRESSION: No acute intracranial finding. 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 Electronically Signed   By: Nelson Chimes M.D.   On: 08/11/2018 17:08     ASSESSMENT AND PLAN:   52 year old female with a history of diabetes who presented to the emergency room in [name] and witnessed tonic-clonic seizures.    1. [name] with uncontrolled diabetes: Patient's [name] has resolved. Patient has endocrinology appointment scheduled September 25 at 2:00 which she will need to follow-up on.  She will continue with Lantus, NovoLog and ADA diet Her last A1c was 14.3  2. 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 twice daily as per neurology She was instructed no driving or operating heavy machinery until cleared by neurology  3. Acute kidney injury on chronic kidney disease stage IV: Acute kidney injury was due to prerenal azotemia from [name] which has resolved.  Her creatinine is now at baseline.   She will have follow-up with her nephrologist. 4. Hyperlipidemia: Continue  atorvastatin  5. History of breast cancer on tamoxifen  6. Essential hypertension: Continue Norvasc  7. Hypocalcemia: He was treated with calcium.  She will need close follow-up for her low calcium level.  Hypocalcemia is likely due to chronic renal diseaseManagement plans discussed with the patient and she is in agreement.  CODE STATUS: Full  TOTAL TIME TAKING CARE OF THIS PATIENT: 28 minutes.     POSSIBLE D/C today, DEPENDING ON CLINICAL CONDITION.   Casten Floren M.D on 08/13/2018 at 11:16 AM  Between 7am to 6pm - Pager - [phone] After 6pm go to [email or web address] - password [email or web address] [facility]  Office  [phone]  CC: Primary care physician; Hemming, Claudette Laws, MD  Note: This dictation was prepared with Dragon dictation along with smaller phrase technology. Any transcriptional errors that result from this process are unintentional.

## 2018-08-13 NOTE — Progress Notes (Signed)
Dr Jodell Cipro notified of CBG 413. Received new order for labs to be drawn and insulin order.

## 2018-08-13 NOTE — Progress Notes (Signed)
PT Cancellation Note  Patient Details Name: Alyssa Floyd MRN: [id number] DOB: 1966/10/30   Cancelled Treatment:    Reason Eval/Treat Not Completed: Other (comment).  Pt is back from EEG and while was not normal was not showing seizure activity.  Pt is declining to walk with PT, states she is able to get up alone.  Informed nursing and nursing reports she is leaving today.  Will have rehab staff recheck tomorrow if pt is not released for some reason, and will dc order if she is still not interested.   Ramond Dial 08/13/2018, 3:15 PM   Mee Hives, PT MS Acute Rehab Dept. Number: [place] and [place]

## 2018-08-13 NOTE — Discharge Summary (Signed)
[facility] at [place] NAME: Alyssa Floyd    MR#:  [id number]  DATE OF BIRTH:  14-Jun-1966  DATE OF ADMISSION:  08/11/2018 ADMITTING PHYSICIAN: Loletha Grayer, MD  DATE OF DISCHARGE: 08/13/2018  PRIMARY CARE PHYSICIAN: Hemming, Claudette Laws, MD    ADMISSION DIAGNOSIS:  Seizure ([place]) [R56.9] Diabetic ketoacidosis without coma associated with diabetes mellitus due to underlying condition ([place]) [E08.10] Hypertension, unspecified type [I10]  DISCHARGE DIAGNOSIS:  Active Problems:   [name] (diabetic ketoacidoses) ([place])   SECONDARY DIAGNOSIS:   Past Medical History:  Diagnosis Date  . Breast cancer ([place])   . 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 Diabetes mellitus ([place])   . Diabetic nephropathy ([place])   . Hyperlipidemia   . Hypertension   . Nephrotic syndrome    diabetic nephropathy, biopsy on 01/06/16 at [facility]  . Normocytic anemia   . Subclinical hypothyroidism   . Tobacco use   . Vitamin D deficiency     HOSPITAL COURSE:  52 year old female with a history of diabetes who presented to the emergency room in [name] and witnessed tonic-clonic seizures.    1.  [name] with uncontrolled diabetes: Patient's [name] has resolved. Patient has endocrinology appointment scheduled September 25 at 2:00 which she will need to follow-up on.  She will continue with Lantus, NovoLog and ADA diet Her last A1c was 14.3  2.  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 twice daily as per neurology She was instructed no driving or operating heavy machinery until cleared by neurology  3.  Acute kidney injury on chronic kidney disease stage IV: Acute kidney injury was due to prerenal azotemia from [name] which has resolved.  Her creatinine is now at baseline.   She will have follow-up with her nephrologist. 4.   Hyperlipidemia: Continue atorvastatin  5.  History of breast cancer on tamoxifen  6.  Essential hypertension: Continue Norvasc  7.  Hypocalcemia: He was treated with calcium.  She will need close follow-up for her low calcium level.  Hypocalcemia is likely due to chronic renal disease.  DISCHARGE CONDITIONS AND DIET:   stAble for discharge on diabetic heart healthy diet  CONSULTS OBTAINED:  Treatment Team:  Catarina Hartshorn, MD Alexis Goodell, MD  DRUG ALLERGIES:   Allergies  Allergen Reactions  . Penicillins Other (See Comments)    Pt reports hallucinations when taking penicillins. >Has patient had a PCN reaction causing immediate rash, facial/tongue/throat swelling, SOB or lightheadedness with hypotension: No Has patient had a PCN reaction causing severe rash involving mucus membranes or skin necrosis: No Has patient had a PCN reaction that required hospitalization No Has patient had a PCN reaction occurring within the last 10 years: No If all of the above answers are "NO", then may proceed with Cephalosporin use.    DISCHARGE MEDICATIONS:   Allergies as of 08/13/2018      Reactions   Penicillins Other (See Comments)   Pt reports hallucinations when taking penicillins. >Has patient had a PCN reaction causing immediate rash, facial/tongue/throat swelling, SOB or lightheadedness with hypotension: No Has patient had a PCN reaction causing severe rash involving mucus membranes or skin necrosis: No Has patient had a PCN reaction that required hospitalization No Has patient had a PCN reaction occurring within the last 10 years: No If all of the above  answers are "NO", then may proceed with Cephalosporin use.      Medication List    STOP taking these medications   potassium chloride SA 20 MEQ tablet Commonly known as:  K-DUR,KLOR-CON   torsemide 100 MG tablet Commonly known as:  DEMADEX     TAKE these medications   amLODipine 5 MG tablet Commonly known as:   NORVASC Take 5 mg by mouth daily.   atorvastatin 40 MG tablet Commonly known as:  LIPITOR Take 0.5 tablets (20 mg total) by mouth daily.   cephALEXin 250 MG capsule Commonly known as:  KEFLEX Take 1 capsule (250 mg total) by mouth 2 (two) times daily for 3 days.   insulin aspart 100 UNIT/ML injection Commonly known as:  novoLOG Inject 4 Units into the skin 3 (three) times daily with meals. Please take fingersticks reading more than 200   insulin glargine 100 UNIT/ML injection Commonly known as:  LANTUS Inject 0.15 mLs (15 Units total) into the skin daily. What changed:  how much to take   Lacosamide 100 MG Tabs Take 1 tablet (100 mg total) by mouth 2 (two) times daily.   tamoxifen 20 MG tablet Commonly known as:  NOLVADEX Take 20 mg by mouth daily.   Vitamin D (Ergocalciferol) 50000 units Caps capsule Commonly known as:  DRISDOL Take 50,000 Units by mouth every 7 (seven) days.         Today   CHIEF COMPLAINT:  No acute issues overnight   VITAL SIGNS:  Blood pressure 133/77, pulse 79, temperature 97.7 F (36.5 C), temperature source Oral, resp. rate 20, height 5\' 5"  (1.651 m), weight 70.7 kg, SpO2 99 %.   REVIEW OF SYSTEMS:  Review of Systems  Constitutional: Negative.  Negative for chills, fever and malaise/fatigue.  HENT: Negative.  Negative for ear discharge, ear pain, hearing loss, nosebleeds and sore throat.   Eyes: Negative.  Negative for blurred vision and pain.  Respiratory: Negative.  Negative for cough, hemoptysis, shortness of breath and wheezing.   Cardiovascular: Negative.  Negative for chest pain, palpitations and leg swelling.  Gastrointestinal: Negative.  Negative for abdominal pain, blood in stool, diarrhea, nausea and vomiting.  Genitourinary: Negative.  Negative for dysuria.  Musculoskeletal: Negative.  Negative for back pain.  Skin: Negative.   Neurological: Negative for dizziness, tremors, speech change, focal weakness, seizures and  headaches.  Endo/Heme/Allergies: Negative.  Does not bruise/bleed easily.  Psychiatric/Behavioral: Negative.  Negative for depression, hallucinations and suicidal ideas.     PHYSICAL EXAMINATION:  GENERAL:  52 y.o.-year-old patient lying in the bed with no acute distress.  NECK:  Supple, no jugular venous distention. No thyroid enlargement, no tenderness.  LUNGS: Normal breath sounds bilaterally, no wheezing, rales,rhonchi  No use of accessory muscles of respiration.  CARDIOVASCULAR: S1, S2 normal. No murmurs, rubs, or gallops.  ABDOMEN: Soft, non-tender, non-distended. Bowel sounds present. No organomegaly or mass.  EXTREMITIES: No pedal edema, cyanosis, or clubbing.  PSYCHIATRIC: The patient is alert and oriented x 3.  SKIN: No obvious rash, lesion, or ulcer.   DATA REVIEW:   CBC Recent Labs  Lab 08/12/18 0205  WBC 8.2  HGB 8.4*  HCT 24.9*  PLT 221    Chemistries  Recent Labs  Lab 08/13/18 0220  NA 130*  K 5.0  CL 103  CO2 17*  GLUCOSE 430*  BUN 41*  CREATININE 3.64*  CALCIUM 6.0*  MG 2.5*    Cardiac Enzymes No results for input(s): TROPONINI in the last 168  hours.  Microbiology Results  @MICRORSLT48 @  RADIOLOGY:  Ct Head Wo Contrast  Result Date: 08/11/2018 CLINICAL DATA:  Recent seizure activity, history of breast carcinoma EXAM: CT HEAD WITHOUT CONTRAST TECHNIQUE: Contiguous axial images were obtained from the base of the skull through the vertex without intravenous contrast. COMPARISON:  01/29/2016 FINDINGS: Brain: Mild atrophic changes are noted. No findings to suggest acute hemorrhage, acute infarction or space-occupying mass lesion are noted. Vascular: No hyperdense vessel or unexpected calcification. Skull: Normal. Negative for fracture or focal lesion. Sinuses/Orbits: No acute finding. Other: None. IMPRESSION: Chronic atrophic changes without acute abnormality. Electronically Signed   By: Inez Catalina M.D.   On: 08/11/2018 12:40   Mr Brain Wo  Contrast  Result Date: 08/11/2018 CLINICAL DATA:  Acute presentation with seizure. Focal degenerate lie eyes. EXAM: MRI HEAD WITHOUT CONTRAST TECHNIQUE: Multiplanar, multiecho pulse sequences of the brain and surrounding structures were obtained without intravenous contrast. COMPARISON:  CT same day.  MRI 01/29/2016 FINDINGS: Brain: Mild generalized atrophy. No evidence of old or acute focal small or large vessel infarction. No mass lesion, hemorrhage, hydrocephalus or extra-axial collection. Hippocampus on the right is somewhat atrophic compared to the left. Vascular: Major vessels at the base of the brain show flow. Skull and upper cervical spine: Negative Sinuses/Orbits: Clear/normal Other: None IMPRESSION: No acute intracranial finding. 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 Electronically Signed   By: Nelson Chimes M.D.   On: 08/11/2018 17:08      Allergies as of 08/13/2018      Reactions   Penicillins Other (See Comments)   Pt reports hallucinations when taking penicillins. >Has patient had a PCN reaction causing immediate rash, facial/tongue/throat swelling, SOB or lightheadedness with hypotension: No Has patient had a PCN reaction causing severe rash involving mucus membranes or skin necrosis: No Has patient had a PCN reaction that required hospitalization No Has patient had a PCN reaction occurring within the last 10 years: No If all of the above answers are "NO", then may proceed with Cephalosporin use.      Medication List    STOP taking these medications   potassium chloride SA 20 MEQ tablet Commonly known as:  K-DUR,KLOR-CON   torsemide 100 MG tablet Commonly known as:  DEMADEX     TAKE these medications   amLODipine 5 MG tablet Commonly known as:  NORVASC Take 5 mg by mouth daily.   atorvastatin 40 MG tablet Commonly known as:  LIPITOR Take 0.5 tablets (20 mg total) by mouth daily.   cephALEXin 250 MG capsule Commonly known as:  KEFLEX Take 1 capsule  (250 mg total) by mouth 2 (two) times daily for 3 days.   insulin aspart 100 UNIT/ML injection Commonly known as:  novoLOG Inject 4 Units into the skin 3 (three) times daily with meals. Please take fingersticks reading more than 200   insulin glargine 100 UNIT/ML injection Commonly known as:  LANTUS Inject 0.15 mLs (15 Units total) into the skin daily. What changed:  how much to take   Lacosamide 100 MG Tabs Take 1 tablet (100 mg total) by mouth 2 (two) times daily.   tamoxifen 20 MG tablet Commonly known as:  NOLVADEX Take 20 mg by mouth daily.   Vitamin D (Ergocalciferol) 50000 units Caps capsule Commonly known as:  DRISDOL Take 50,000 Units by mouth every 7 (seven) days.          Management plans discussed with the patient and she is in agreement. Stable for  discharge   Patient should follow up with pcp  CODE STATUS:     Code Status Orders  (From admission, onward)         Start     Ordered   08/11/18 1356  Full code  Continuous     08/11/18 1355        Code Status History    Date Active Date Inactive Code Status Order ID Comments User Context   07/08/2018 0437 07/11/2018 2132 Full Code [id number]  Omar Person, NP ED   01/29/2016 1316 02/01/2016 1530 Full Code [id number]  Juluis Mire, MD ED      TOTAL TIME TAKING CARE OF THIS PATIENT: 39 minutes.    Note: This dictation was prepared with Dragon dictation along with smaller phrase technology. Any transcriptional errors that result from this process are unintentional.  Nike Southers M.D on 08/13/2018 at 11:06 AM  Between 7am to 6pm - Pager - [phone] After 6pm go to [email or web address] - password [email or web address] [facility]  Office  [phone]  CC: Primary care physician; Hemming, Claudette Laws, MD

## 2018-08-13 NOTE — Progress Notes (Signed)
Pt discharged to home via family car. Pt verbalized understanding of her discharge instructions and to follow up with her appointments.

## 2018-08-13 NOTE — Progress Notes (Signed)
Pharmacy Electrolyte Monitoring Consult:  Pharmacy consulted to assist in monitoring and replacing electrolytes in this 52 y.o. female admitted on 08/11/2018 with seizures and found to be in [name].  Labs:  Sodium (mmol/L)  Date Value  08/13/2018 130 (L)   Potassium (mmol/L)  Date Value  08/13/2018 5.0   Magnesium (mg/dL)  Date Value  08/13/2018 2.5 (H)   Phosphorus (mg/dL)  Date Value  08/13/2018 3.5   Calcium (mg/dL)  Date Value  08/13/2018 6.0 (LL)   Albumin (g/dL)  Date Value  08/12/2018 1.8 (L)    Assessment/Plan: Patient transferred off Insulin drip and out of ICU. Receiving Levemir 15u QHS.   No electrolyte replacement needed at this time.   Will continue to monitor labs and replace electrolytes as needed.   Pernell Dupre, PharmD, BCPS Clinical Pharmacist 08/13/2018 10:02 AM

## 2018-08-13 NOTE — Procedures (Addendum)
ELECTROENCEPHALOGRAM REPORT   Patient: Alyssa Floyd       Room #: 153A-AA EEG No. ID: [id number] Age: 52 y.o.        Sex: female Referring Physician: Mody Report Date:  08/13/2018        Interpreting Physician: Alexis Goodell  History: Alyssa Floyd is an 52 y.o. female with seizure like activity  Medications:  Norvasc, Lipitor, Rocephin, Insulin, Vimpat, Tamoxifen  Conditions of Recording:  This is a 21 channel routine scalp EEG performed with bipolar and monopolar montages arranged in accordance to the international 10/20 system of electrode placement. One channel was dedicated to EKG recording.  The patient is in the awake and drowsy states.  Description:  The waking background activity consists of a low voltage, symmetrical, fairly well organized, 7-7.5 Hz theta activity, seen from the parieto-occipital and posterior temporal regions.  Low voltage fast activity, poorly organized, is seen anteriorly and is at times superimposed on more posterior regions.  A mixture of theta and alpha rhythms are seen from the central and temporal regions. The patient drowses with slowing to irregular, low voltage theta and beta activity.   Stage II sleep is not obtained. No epileptiform activity is noted.   Hyperventilation was not performed.  Intermittent photic stimulation was performed but failed to illicit any change in the tracing.     IMPRESSION: This is an abnormal EEG secondary to posterior background slowing.  This finding may be seen with a diffuse gray matter disturbance that is etiologically nonspecific, but may include a dementia, among other possibilities.  No epileptiform activity is noted.     Alexis Goodell, MD Neurology [phone] 08/13/2018, 2:42 PM

## 2018-08-13 NOTE — Progress Notes (Signed)
This RN assumed care of pt after 1600. Per previous RN pt is waiting on her ride who will be here after 1800, RN has already reviewed discharge instructions and prescriptions with pt. Pt's BP elevated to 174/89, and manual recheck was 192/88, HR in 80s. Pt is asymptomatic. Dr. Bridgett Larsson notified, received order for 10mg  IV hydralazine. Dr. Bridgett Larsson instructed if pt's BP comes down, she could still be discharged tonight since she is asymptomatic. Pt updated.   [place]ary, Jerry Caras

## 2018-08-14 ENCOUNTER — Other Ambulatory Visit: Payer: Self-pay

## 2018-08-14 ENCOUNTER — Inpatient Hospital Stay
Admission: EM | Admit: 2018-08-14 | Discharge: 2018-08-16 | DRG: 637 | Disposition: A | Discharge status: Home Health | Attending: Internal Medicine | Admitting: Internal Medicine

## 2018-08-14 ENCOUNTER — Emergency Department

## 2018-08-14 DIAGNOSIS — E785 Hyperlipidemia, unspecified: Secondary | ICD-10-CM | POA: Diagnosis present

## 2018-08-14 DIAGNOSIS — R569 Unspecified convulsions: Secondary | ICD-10-CM | POA: Diagnosis not present

## 2018-08-14 DIAGNOSIS — Z79810 Long term (current) use of selective estrogen receptor modulators (SERMs): Secondary | ICD-10-CM

## 2018-08-14 DIAGNOSIS — E860 Dehydration: Secondary | ICD-10-CM | POA: Diagnosis present

## 2018-08-14 DIAGNOSIS — N179 Acute kidney failure, unspecified: Secondary | ICD-10-CM | POA: Diagnosis not present

## 2018-08-14 DIAGNOSIS — J9601 Acute respiratory failure with hypoxia: Secondary | ICD-10-CM | POA: Diagnosis present

## 2018-08-14 DIAGNOSIS — E875 Hyperkalemia: Secondary | ICD-10-CM | POA: Diagnosis present

## 2018-08-14 DIAGNOSIS — E1310 Other specified diabetes mellitus with ketoacidosis without coma: Secondary | ICD-10-CM

## 2018-08-14 DIAGNOSIS — Z794 Long term (current) use of insulin: Secondary | ICD-10-CM

## 2018-08-14 DIAGNOSIS — E871 Hypo-osmolality and hyponatremia: Secondary | ICD-10-CM | POA: Diagnosis not present

## 2018-08-14 DIAGNOSIS — N184 Chronic kidney disease, stage 4 (severe): Secondary | ICD-10-CM | POA: Diagnosis not present

## 2018-08-14 DIAGNOSIS — E1021 Type 1 diabetes mellitus with diabetic nephropathy: Secondary | ICD-10-CM | POA: Diagnosis present

## 2018-08-14 DIAGNOSIS — I129 Hypertensive chronic kidney disease with stage 1 through stage 4 chronic kidney disease, or unspecified chronic kidney disease: Secondary | ICD-10-CM | POA: Diagnosis not present

## 2018-08-14 DIAGNOSIS — E1010 Type 1 diabetes mellitus with ketoacidosis without coma: Principal | ICD-10-CM | POA: Diagnosis present

## 2018-08-14 DIAGNOSIS — F17210 Nicotine dependence, cigarettes, uncomplicated: Secondary | ICD-10-CM | POA: Diagnosis not present

## 2018-08-14 DIAGNOSIS — E1022 Type 1 diabetes mellitus with diabetic chronic kidney disease: Secondary | ICD-10-CM | POA: Diagnosis not present

## 2018-08-14 DIAGNOSIS — Z853 Personal history of malignant neoplasm of breast: Secondary | ICD-10-CM

## 2018-08-14 DIAGNOSIS — D631 Anemia in chronic kidney disease: Secondary | ICD-10-CM | POA: Diagnosis present

## 2018-08-14 HISTORY — DX: Unspecified convulsions: R56.9

## 2018-08-14 LAB — BLOOD GAS, VENOUS
Bicarbonate: UNDETERMINED mmol/L (ref 20.0–28.0)
FIO2: 0.21
O2 Saturation: UNDETERMINED %
PCO2 VEN: 19 mmHg — AB (ref 44.0–60.0)
PO2 VEN: 70 mmHg — AB (ref 32.0–45.0)
Patient temperature: 37
pH, Ven: 7.07 — CL (ref 7.250–7.430)

## 2018-08-14 LAB — BASIC METABOLIC PANEL
BUN: 54 mg/dL — ABNORMAL HIGH (ref 6–20)
BUN: 52 mg/dL — ABNORMAL HIGH (ref 6–20)
BUN: 54 mg/dL — ABNORMAL HIGH (ref 6–20)
CALCIUM: 6.1 mg/dL — AB (ref 8.9–10.3)
CHLORIDE: 97 mmol/L — AB (ref 98–111)
CO2: <7 mmol/L — ABNORMAL LOW (ref 22–32)
CO2: <7 mmol/L — ABNORMAL LOW (ref 22–32)
CO2: <7 mmol/L — ABNORMAL LOW (ref 22–32)
CREATININE: 4.28 mg/dL — AB (ref 0.44–1.00)
Calcium: 6.6 mg/dL — ABNORMAL LOW (ref 8.9–10.3)
Calcium: 6.3 mg/dL — CL (ref 8.9–10.3)
Chloride: 102 mmol/L (ref 98–111)
Chloride: 103 mmol/L (ref 98–111)
Creatinine, Ser: 4.48 mg/dL — ABNORMAL HIGH (ref 0.44–1.00)
Creatinine, Ser: 4.21 mg/dL — ABNORMAL HIGH (ref 0.44–1.00)
GFR calc non Af Amer: 10 mL/min — ABNORMAL LOW (ref >60)
GFR, EST AFRICAN AMERICAN: 12 mL/min — AB (ref >60)
GFR, EST AFRICAN AMERICAN: 13 mL/min — AB (ref >60)
GFR, EST AFRICAN AMERICAN: 13 mL/min — AB (ref >60)
GFR, EST NON AFRICAN AMERICAN: 11 mL/min — AB (ref >60)
GFR, EST NON AFRICAN AMERICAN: 11 mL/min — AB (ref >60)
GLUCOSE: 1015 mg/dL — AB (ref 70–99)
GLUCOSE: 890 mg/dL — AB (ref 70–99)
Glucose, Bld: 831 mg/dL (ref 70–99)
Potassium: >7.5 mmol/L (ref 3.5–5.1)
Potassium: 6.3 mmol/L (ref 3.5–5.1)
Potassium: 5.9 mmol/L — ABNORMAL HIGH (ref 3.5–5.1)
SODIUM: 130 mmol/L — AB (ref 135–145)
Sodium: 126 mmol/L — ABNORMAL LOW (ref 135–145)
Sodium: 130 mmol/L — ABNORMAL LOW (ref 135–145)

## 2018-08-14 LAB — TROPONIN I

## 2018-08-14 LAB — GLUCOSE, CAPILLARY
Glucose-Capillary: >600 mg/dL (ref 70–99)
Glucose-Capillary: 574 mg/dL (ref 70–99)
Glucose-Capillary: 544 mg/dL (ref 70–99)

## 2018-08-14 LAB — CBC
HEMATOCRIT: 28.3 % — AB (ref 35.0–47.0)
HEMOGLOBIN: 8.3 g/dL — AB (ref 12.0–16.0)
MCH: 30.4 pg (ref 26.0–34.0)
MCHC: 29.2 g/dL — ABNORMAL LOW (ref 32.0–36.0)
MCV: 103.9 fL — ABNORMAL HIGH (ref 80.0–100.0)
Platelets: 264 10*3/uL (ref 150–440)
RBC: 2.72 MIL/uL — ABNORMAL LOW (ref 3.80–5.20)
RDW: 16.4 % — ABNORMAL HIGH (ref 11.5–14.5)
WBC: 9.2 10*3/uL (ref 3.6–11.0)

## 2018-08-14 LAB — MRSA PCR SCREENING: MRSA by PCR: NEGATIVE

## 2018-08-14 LAB — BRAIN NATRIURETIC PEPTIDE: B NATRIURETIC PEPTIDE 5: 396 pg/mL — AB (ref 0.0–100.0)

## 2018-08-14 LAB — BETA-HYDROXYBUTYRIC ACID: Beta-Hydroxybutyric Acid: >8 mmol/L — ABNORMAL HIGH (ref 0.05–0.27)

## 2018-08-14 LAB — CALCIUM, IONIZED: Calcium, Ionized, Serum: <3 mg/dL — ABNORMAL LOW (ref 4.5–5.6)

## 2018-08-14 MED ORDER — SENNOSIDES-DOCUSATE SODIUM 8.6-50 MG PO TABS: 1.0000 | ORAL_TABLET | Freq: Every evening | ORAL | Status: DC | PRN

## 2018-08-14 MED ORDER — SODIUM CHLORIDE 0.9 % IV SOLN
INTRAVENOUS | Status: DC
  Administered 2018-08-14: 5.4 [IU]/h via INTRAVENOUS
  Filled 2018-08-14: qty 1

## 2018-08-14 MED ORDER — SODIUM CHLORIDE 0.9 % IV BOLUS
1000.0000 mL | Freq: Once | INTRAVENOUS | Status: AC
  Administered 2018-08-14: 1000 mL via INTRAVENOUS

## 2018-08-14 MED ORDER — INSULIN ASPART 100 UNIT/ML ~~LOC~~ SOLN
10.0000 [IU] | Freq: Once | SUBCUTANEOUS | Status: AC
  Administered 2018-08-14: 10 [IU] via INTRAVENOUS
  Filled 2018-08-14: qty 1

## 2018-08-14 MED ORDER — SODIUM CHLORIDE 0.9 % IV SOLN
INTRAVENOUS | Status: AC
  Administered 2018-08-14: 999 mL/h via INTRAVENOUS

## 2018-08-14 MED ORDER — SODIUM CHLORIDE 0.9 % IV SOLN
INTRAVENOUS | Status: DC
  Administered 2018-08-14: 21:00:00 via INTRAVENOUS

## 2018-08-14 MED ORDER — DEXTROSE-NACL 5-0.45 % IV SOLN: INTRAVENOUS | Status: DC

## 2018-08-14 MED ORDER — HEPARIN SODIUM (PORCINE) 5000 UNIT/ML IJ SOLN
5000.0000 [IU] | Freq: Three times a day (TID) | INTRAMUSCULAR | Status: DC
  Administered 2018-08-14 – 2018-08-16 (×6): 5000 [IU] via SUBCUTANEOUS
  Filled 2018-08-14 (×6): qty 1

## 2018-08-14 MED ORDER — HYDROCODONE-ACETAMINOPHEN 5-325 MG PO TABS: 1.0000 | ORAL_TABLET | ORAL | Status: DC | PRN

## 2018-08-14 MED ORDER — SODIUM CHLORIDE 0.9 % IV SOLN
1.0000 g | Freq: Once | INTRAVENOUS | Status: AC
  Administered 2018-08-14: 1 g via INTRAVENOUS
  Filled 2018-08-14 (×2): qty 10

## 2018-08-14 MED ORDER — SODIUM CHLORIDE 0.45 % IV SOLN: INTRAVENOUS | Status: DC

## 2018-08-14 MED ORDER — BISACODYL 5 MG PO TBEC: 5.0000 mg | DELAYED_RELEASE_TABLET | Freq: Every day | ORAL | Status: DC | PRN

## 2018-08-14 MED ORDER — LACOSAMIDE 50 MG PO TABS
100.0000 mg | ORAL_TABLET | Freq: Two times a day (BID) | ORAL | Status: DC
  Administered 2018-08-14 – 2018-08-16 (×4): 100 mg via ORAL
  Filled 2018-08-14 (×4): qty 2

## 2018-08-14 MED ORDER — DEXTROSE-NACL 5-0.45 % IV SOLN
INTRAVENOUS | Status: DC
  Administered 2018-08-15: 05:00:00 via INTRAVENOUS

## 2018-08-14 MED ORDER — SODIUM CHLORIDE 0.9 % IV SOLN
INTRAVENOUS | Status: DC
  Administered 2018-08-15: 25.1 [IU]/h via INTRAVENOUS
  Filled 2018-08-14 (×4): qty 1

## 2018-08-14 MED ORDER — AMLODIPINE BESYLATE 5 MG PO TABS
5.0000 mg | ORAL_TABLET | Freq: Every day | ORAL | Status: DC
  Administered 2018-08-14 – 2018-08-16 (×3): 5 mg via ORAL
  Filled 2018-08-14 (×3): qty 1

## 2018-08-14 MED ORDER — ACETAMINOPHEN 325 MG PO TABS: 650.0000 mg | ORAL_TABLET | Freq: Four times a day (QID) | ORAL | Status: DC | PRN

## 2018-08-14 MED ORDER — SODIUM BICARBONATE 8.4 % IV SOLN
50.0000 meq | Freq: Once | INTRAVENOUS | Status: AC
  Administered 2018-08-14: 50 meq via INTRAVENOUS
  Filled 2018-08-14: qty 50

## 2018-08-14 MED ORDER — ACETAMINOPHEN 650 MG RE SUPP: 650.0000 mg | Freq: Four times a day (QID) | RECTAL | Status: DC | PRN

## 2018-08-14 NOTE — ED Notes (Signed)
RT aware of VBG in lab

## 2018-08-14 NOTE — ED Triage Notes (Signed)
Pt to ER c/o SOB. States that she has been seen here recently for seizures which are new to her. Pt hyperventilating in triage and appears confused. States she has been SOB since yesterday.

## 2018-08-14 NOTE — H&P (Addendum)
[facility] [place] NAME: Alyssa Floyd    MR#:  [id number]  DATE OF BIRTH:  11/28/1966  DATE OF ADMISSION:  08/14/2018  PRIMARY CARE PHYSICIAN: Hemming, Claudette Laws, MD   REQUESTING/REFERRING PHYSICIAN: Dr. Jacqualine Code.  CHIEF COMPLAINT:   Chief Complaint  Patient presents with  . Shortness of Breath   Shortness of breath since last night. HISTORY OF PRESENT ILLNESS:  Alyssa Floyd  is a 52 y.o. female with a known history of multiple medical problems as below.  The patient was just discharged to home from our hospital after treatment of [name] in this new onset seizure.  She has stress from family and feels sick.  She has had shortness of breath, nausea and generalized weakness.  She has no appetite and did not eat.  She did not take home Lantus and insulin.  She is found hypercalcemia more than 7.5 blood glucose more than 1000.  She is given calcium gluconate and NovoLog IV in the ED.  PAST MEDICAL HISTORY:   Past Medical History:  Diagnosis Date  . Breast cancer ([place])   . 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 Diabetes mellitus ([place])   . Diabetic nephropathy ([place])   . Hyperlipidemia   . Hypertension   . Nephrotic syndrome    diabetic nephropathy, biopsy on 01/06/16 at [facility]  . Normocytic anemia   . Seizures ([place])   . Subclinical hypothyroidism   . Tobacco use   . Vitamin D deficiency     PAST SURGICAL HISTORY:   Past Surgical History:  Procedure Laterality Date  . BREAST SURGERY    . PARTIAL HYSTERECTOMY      SOCIAL HISTORY:   Social History   Tobacco Use  . Smoking status: Current Every Day Smoker    Packs/day: 0.50    Types: Cigarettes  . Smokeless tobacco: Never Used  Substance Use Topics  . Alcohol use: No    FAMILY HISTORY:   Family History  Problem Relation Age of Onset  . Diabetes Unknown     DRUG ALLERGIES:   Allergies  Allergen Reactions  . Penicillins Other (See  Comments)    Pt reports hallucinations when taking penicillins. >Has patient had a PCN reaction causing immediate rash, facial/tongue/throat swelling, SOB or lightheadedness with hypotension: No Has patient had a PCN reaction causing severe rash involving mucus membranes or skin necrosis: No Has patient had a PCN reaction that required hospitalization No Has patient had a PCN reaction occurring within the last 10 years: No If all of the above answers are "NO", then may proceed with Cephalosporin use.    REVIEW OF SYSTEMS:   Review of Systems  Constitutional: Positive for malaise/fatigue. Negative for chills and fever.  HENT: Negative for sore throat.   Eyes: Negative for blurred vision and double vision.  Respiratory: Positive for shortness of breath. Negative for cough, hemoptysis, wheezing and stridor.   Cardiovascular: Negative for chest pain, palpitations, orthopnea and leg swelling.  Gastrointestinal: Positive for nausea and vomiting. Negative for abdominal pain, blood in stool, diarrhea and melena.  Genitourinary: Negative for dysuria, flank pain and hematuria.  Musculoskeletal: Negative for back pain and joint pain.  Skin: Negative for rash.  Neurological: Negative for dizziness, sensory change, focal weakness, seizures, loss of consciousness, weakness and headaches.  Endo/Heme/Allergies: Negative for polydipsia.  Psychiatric/Behavioral: Negative for depression. The patient is not nervous/anxious.     MEDICATIONS AT HOME:  Prior to Admission medications   Medication Sig Start Date End Date Taking? Authorizing Provider  amLODipine (NORVASC) 5 MG tablet Take 5 mg by mouth daily. 08/01/18   [provider]  atorvastatin (LIPITOR) 40 MG tablet Take 0.5 tablets (20 mg total) by mouth daily. 07/11/18   Elgergawy, Silver Huguenin, MD  cephALEXin (KEFLEX) 250 MG capsule Take 1 capsule (250 mg total) by mouth 2 (two) times daily for 3 days. 08/13/18 08/16/18  Bettey Costa, MD  insulin  aspart (NOVOLOG) 100 UNIT/ML injection Inject 4 Units into the skin 3 (three) times daily with meals. Please take fingersticks reading more than 200 07/11/18   Elgergawy, Silver Huguenin, MD  insulin glargine (LANTUS) 100 UNIT/ML injection Inject 0.15 mLs (15 Units total) into the skin daily. 08/13/18   Bettey Costa, MD  lacosamide 100 MG TABS Take 1 tablet (100 mg total) by mouth 2 (two) times daily. 08/13/18   Bettey Costa, MD  tamoxifen (NOLVADEX) 20 MG tablet Take 20 mg by mouth daily. 08/01/18   [provider]  Vitamin D, Ergocalciferol, (DRISDOL) 50000 units CAPS capsule Take 50,000 Units by mouth every 7 (seven) days. 07/26/18   [provider]      VITAL SIGNS:  Blood pressure (!) 151/63, pulse (!) 108, temperature 99.6 F (37.6 C), resp. rate (!) 28, height 5\' 5"  (1.651 m), weight 70.7 kg, SpO2 100 %.  PHYSICAL EXAMINATION:  Physical Exam  GENERAL:  52 y.o.-year-old patient lying in the bed with no acute distress.  EYES: Pupils equal, round, reactive to light and accommodation. No scleral icterus. Extraocular muscles intact.  HEENT: Head atraumatic, normocephalic. Oropharynx and nasopharynx clear.  NECK:  Supple, no jugular venous distention. No thyroid enlargement, no tenderness.  LUNGS: Normal breath sounds bilaterally, no wheezing, rales,rhonchi or crepitation. No use of accessory muscles of respiration.  CARDIOVASCULAR: S1, S2 normal. No murmurs, rubs, or gallops.  ABDOMEN: Soft, nontender, nondistended. Bowel sounds present. No organomegaly or mass.  EXTREMITIES: No pedal edema, cyanosis, or clubbing.  NEUROLOGIC: Cranial nerves II through XII are intact. Muscle strength 5/5 in all extremities. Sensation intact. Gait not checked.  PSYCHIATRIC: The patient is alert and oriented x 3.  SKIN: No obvious rash, lesion, or ulcer.   LABORATORY PANEL:   CBC Recent Labs  Lab 08/14/18 1813  WBC 9.2  HGB 8.3*  HCT 28.3*  PLT 264    ------------------------------------------------------------------------------------------------------------------  Chemistries  Recent Labs  Lab 08/13/18 0220 08/14/18 1813  NA 130* 126*  K 5.0 >7.5*  CL 103 97*  CO2 17* <7*  GLUCOSE 430* 1,015*  BUN 41* 54*  CREATININE 3.64* 4.48*  CALCIUM 6.0* 6.6*  MG 2.5*  --    ------------------------------------------------------------------------------------------------------------------  Cardiac Enzymes No results for input(s): TROPONINI in the last 168 hours. ------------------------------------------------------------------------------------------------------------------  RADIOLOGY:  Dg Chest Port 1 View  Result Date: 08/14/2018 CLINICAL DATA:  Shortness of breath since yesterday. Hyperventilation. History of breast cancer. EXAM: PORTABLE CHEST 1 VIEW COMPARISON:  Chest radiograph July 09, 2018 FINDINGS: Cardiac silhouette is mildly enlarged. Pulmonary vascular congestion without pleural effusion or focal consolidation. No pneumothorax. Single-lumen LEFT chest Port-A-Cath in situ. Surgical clips RIGHT breast. On IMPRESSION: Mild cardiomegaly and pulmonary vascular congestion. Electronically Signed   By: Elon Alas M.D.   On: 08/14/2018 18:59      IMPRESSION AND PLAN:   [name]. The patient will be admitted to stepdown unit. Continue insulin drip, IV fluid support, follow [name] protocol, BMP every 4 hours.  Hyperkalemia. The patient  was given calcium gluconate, NovoLog IV, follow-up potassium level.  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 Continue IV fluid support and follow-up BMP.  Hypertension.  Continue Norvasc.  Anemia of chronic disease.  Stable.  I discussed with E-link intensivist Dr. Madalyn Rob. All the records are reviewed and case discussed with ED provider. Management plans discussed with the patient, family and they are in agreement.  CODE STATUS: Full code  TOTAL CRITICAL TIME TAKING CARE OF THIS PATIENT: 43 minutes.     Demetrios Loll M.D on 08/14/2018 at 7:04 PM  Between 7am to 6pm - Pager - [phone]  After 6pm go to [email or web address] - Proofreader  [facility]  Office  [phone]  CC: Primary care physician; Hemming, Claudette Laws, MD   Note: This dictation was prepared with Dragon dictation along with smaller phrase technology. Any transcriptional errors that result from this process are unin

## 2018-08-14 NOTE — ED Notes (Signed)
Calcium gluconate sent from pharmacy faulty, pharmacy notified to send another dose

## 2018-08-14 NOTE — ED Provider Notes (Signed)
[facility] Emergency Department Provider Note   ____________________________________________   First MD Initiated Contact with Patient 08/14/18 1928     (approximate)  I have reviewed the triage vital signs and the nursing notes.   HISTORY  Chief Complaint Shortness of Breath  EM caveat: Patient appears to be in acute distress, limits some portions of the history and exam.  HPI Alyssa Floyd is a 52 y.o. female since for evaluation of shortness of breath.  Patient reports that she started feeling short of breath and is getting worse and worse as though she does not seem to be able to catch her breath since this morning  She also reports urinating a lot and feeling very fatigued and tired.  She reports she has taken her insulin, but does not know her has not checked her blood sugar.  No fevers or chills.  Was recently hospitalized with similar symptoms.  No seizures.  No chest pain.  No nausea vomiting. Past Medical History:  Diagnosis Date  . Breast cancer ([place])   . 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 Diabetes mellitus ([place])   . Diabetic nephropathy ([place])   . Hyperlipidemia   . Hypertension   . Nephrotic syndrome    diabetic nephropathy, biopsy on 01/06/16 at [facility]  . Normocytic anemia   . Seizures ([place])   . Subclinical hypothyroidism   . Tobacco use   . Vitamin D deficiency     Patient Active Problem List   Diagnosis Date Noted  . [name] (diabetic ketoacidoses) ([place]) 07/08/2018  . Dehydration   . Dysarthria   . HHNC (hyperglycemic hyperosmolar nonketotic coma) ([place])   . Acute encephalopathy   . Acute renal failure (ARF) ([place]) 01/29/2016  . [name], type 1 ([place]) 01/29/2016  . Prolonged Q-T interval on ECG 01/29/2016  . Hyperlipidemia 01/29/2016  . Hypertension 01/29/2016  . Chronic diarrhea 01/29/2016  . Nephrotic syndrome 01/29/2016  . Tobacco use 01/29/2016  . 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 Normocytic anemia  01/29/2016  . Subclinical hypothyroidism 01/29/2016  . Encephalopathy acute     Past Surgical History:  Procedure Laterality Date  . BREAST SURGERY    . PARTIAL HYSTERECTOMY      Prior to Admission medications   Medication Sig Start Date End Date Taking? Authorizing Provider  amLODipine (NORVASC) 5 MG tablet Take 5 mg by mouth daily. 08/01/18  Yes [provider]  atorvastatin (LIPITOR) 40 MG tablet Take 0.5 tablets (20 mg total) by mouth daily. 07/11/18  Yes Elgergawy, Silver Huguenin, MD  insulin aspart (NOVOLOG) 100 UNIT/ML injection Inject 4 Units into the skin 3 (three) times daily with meals. Please take fingersticks reading more than 200 07/11/18  Yes Elgergawy, Silver Huguenin, MD  insulin glargine (LANTUS) 100 UNIT/ML injection Inject 0.15 mLs (15 Units total) into the skin daily. 08/13/18  Yes Mody, Ulice Bold, MD  tamoxifen (NOLVADEX) 20 MG tablet Take 20 mg by mouth daily. 08/01/18  Yes [provider]  Vitamin D, Ergocalciferol, (DRISDOL) 50000 units CAPS capsule Take 50,000 Units by mouth every 7 (seven) days. 07/26/18  Yes [provider]  cephALEXin (KEFLEX) 250 MG capsule Take 1 capsule (250 mg total) by mouth 2 (two) times daily for 3 days. 08/13/18 08/16/18  Bettey Costa, MD  lacosamide 100 MG TABS Take 1 tablet (100 mg total) by mouth 2 (two) times daily. 08/13/18   Bettey Costa, MD    Allergies Penicillins  Family History  Problem Relation  Age of Onset  . Diabetes Unknown     Social History Social History   Tobacco Use  . Smoking status: Current Every Day Smoker    Packs/day: 0.50    Types: Cigarettes  . Smokeless tobacco: Never Used  Substance Use Topics  . Alcohol use: No  . Drug use: No    Review of Systems Constitutional: No fever/chills Eyes: No visual changes. Cardiovascular: Denies chest pain. Respiratory: Denies shortness of breath. Gastrointestinal: No abdominal pain.  No nausea, no vomiting.   Genitourinary: Negative for dysuria.  Frequently  urinating today. Skin: Negative for rash. Neurological: Negative for headaches, focal weakness or numbness.    ____________________________________________   PHYSICAL EXAM:  VITAL SIGNS: ED Triage Vitals [08/14/18 1802]  Enc Vitals Group     BP (!) 149/56     Pulse Rate (!) 108     Resp (!) 28     Temp 99.6 F (37.6 C)     Temp src      SpO2 100 %     Weight 155 lb 13.8 oz (70.7 kg)     Height 5\' 5"  (1.651 m)     Head Circumference      Peak Flow      Pain Score 0     Pain Loc      Pain Edu?      Excl. in [place]?     Constitutional: Alert and oriented.  Acutely ill-appearing, appears tachypneic with cues mauling breathing Eyes: Conjunctivae are normal. Head: Atraumatic. Nose: No congestion/rhinnorhea. Mouth/Throat: Mucous membranes are very dry. Neck: No stridor.  There is no JVD Cardiovascular: Tachycardic rate, regular rhythm. Grossly normal heart sounds.  Good peripheral circulation. Respiratory: Tachypnea with deep inspirations and clear lung sounds.  No retractions. Lungs CTAB.  Tidal CO2 about 8. Gastrointestinal: Soft and nontender. No distention. Musculoskeletal: No lower extremity tenderness nor edema. Neurologic:  Normal speech and language. No gross focal neurologic deficits are appreciated.  Skin:  Skin is warm, dry and intact. No rash noted. Psychiatric: Mood and affect are normal. Speech and behavior are normal.  ____________________________________________   LABS (all labs ordered are listed, but only abnormal results are displayed)  Labs Reviewed  CBC - Abnormal; Notable for the following components:      Result Value   RBC 2.72 (*)    Hemoglobin 8.3 (*)    HCT 28.3 (*)    MCV 103.9 (*)    MCHC 29.2 (*)    RDW 16.4 (*)    All other components within normal limits  BASIC METABOLIC PANEL - Abnormal; Notable for the following components:   Sodium 126 (*)    Potassium >7.5 (*)    Chloride 97 (*)    CO2 <7 (*)    Glucose, Bld 1,015 (*)    BUN  54 (*)    Creatinine, Ser 4.48 (*)    Calcium 6.6 (*)    GFR calc non Af Amer 10 (*)    GFR calc Af Amer 12 (*)    All other components within normal limits  GLUCOSE, CAPILLARY - Abnormal; Notable for the following components:   Glucose-Capillary >600 (*)    All other components within normal limits  BLOOD GAS, VENOUS - Abnormal; Notable for the following components:   pH, Ven 7.07 (*)    pCO2, Ven 19 (*)    pO2, Ven 70.0 (*)    All other components within normal limits  BRAIN NATRIURETIC PEPTIDE - Abnormal; Notable for the  following components:   B Natriuretic Peptide 396.0 (*)    All other components within normal limits  TROPONIN I  BETA-HYDROXYBUTYRIC ACID  URINALYSIS, COMPLETE (UACMP) WITH MICROSCOPIC  BASIC METABOLIC PANEL  CBG MONITORING, ED   ____________________________________________  EKG  Reviewed enterotomy at 1810 Heart rate 150 QRS 130 QTc 400 Sinus tachycardia, question peaking of the T waves in segments. ____________________________________________  RADIOLOGY  Dg Chest Port 1 View  Result Date: 08/14/2018 CLINICAL DATA:  Shortness of breath since yesterday. Hyperventilation. History of breast cancer. EXAM: PORTABLE CHEST 1 VIEW COMPARISON:  Chest radiograph July 09, 2018 FINDINGS: Cardiac silhouette is mildly enlarged. Pulmonary vascular congestion without pleural effusion or focal consolidation. No pneumothorax. Single-lumen LEFT chest Port-A-Cath in situ. Surgical clips RIGHT breast. On IMPRESSION: Mild cardiomegaly and pulmonary vascular congestion. Electronically Signed   By: Elon Alas M.D.   On: 08/14/2018 18:59    Chest x-ray reviewed, mild cardiomegaly and congestion. ____________________________________________   PROCEDURES  Procedure(s) performed: None  Procedures  Critical Care performed: Yes, see critical care note(s)  CRITICAL CARE Performed by: Delman Kitten   Total critical care time: 45 minutes  Critical care time was  exclusive of separately billable procedures and treating other patients.  Critical care was necessary to treat or prevent imminent or life-threatening deterioration.  Critical care was time spent personally by me on the following activities: development of treatment plan with patient and/or surrogate as well as nursing, discussions with consultants, evaluation of patient's response to treatment, examination of patient, obtaining history from patient or surrogate, ordering and performing treatments and interventions, ordering and review of laboratory studies, ordering and review of radiographic studies, pulse oximetry and re-evaluation of patient's condition.  ____________________________________________   INITIAL IMPRESSION / ASSESSMENT AND PLAN / ED COURSE  Pertinent labs & imaging results that were available during my care of the patient were reviewed by me and considered in my medical decision making (see chart for details).  Patient presents for evaluation for shortness of breath.  Found to have notable tachycardia, appears dry with blood sugar critically high.  Low end-tidal CO2 and labs consistent with severe [name].  Patient potassium critically elevated with T wave abnormalities given treatments including bicarb insulin and started with fluid bolus, aggressive fluid hydration and insulin infusion.  Clinical exam appears consistent with [name], some vascular congestion on chest x-ray but given the patient's [name] status fluid resuscitation is indicated at this time.  Vitals:   08/14/18 1915 08/14/18 1930  BP: (!) 153/66 (!) 155/68  Pulse: (!) 111 (!) 106  Resp: (!) 29 (!) 27  Temp:    SpO2: 100% 100%    On reassessment, the patient appears improved her heart rate much better.  Breathing is improving, and she is appearing better.  Suspect her acidosis is improving.  I discussed with the patient being admitted to the ICU at this time under the hospitalist service care.  Condition  improved.      ____________________________________________   FINAL CLINICAL IMPRESSION(S) / ED DIAGNOSES  Final diagnoses:  Diabetic ketoacidosis without coma associated with other specified diabetes mellitus ([place])      NEW MEDICATIONS STARTED DURING THIS VISIT:  New Prescriptions   No medications on file     Note:  This document was prepared using Dragon voice recognition software and may include unintentional dictation errors.     Delman Kitten, MD 08/14/18 [phone]

## 2018-08-14 NOTE — ED Notes (Addendum)
Bridgett Larsson MD at bedside  Message sent to pharmacy about calcium gluconate and insulin drip

## 2018-08-14 NOTE — ED Notes (Signed)
Pt to floor with RN on cardiac monitor. ABCs intact. NAD

## 2018-08-14 NOTE — Consult Note (Signed)
Name: Alyssa Floyd MRN: [id number] DOB: Jul 20, 1966    ADMISSION DATE:  08/14/2018 CONSULTATION DATE:  08/14/2018  REFERRING MD :  Dr. Bridgett Larsson  CHIEF COMPLAINT:  Shortness of Breath  BRIEF PATIENT DESCRIPTION:  52 y.o. Female admitted with [name] and AKI  SIGNIFICANT EVENTS  08/14/2018>> Admission to [facility] Stepdown unit  STUDIES:   HISTORY OF PRESENT ILLNESS:   Alyssa Floyd is a 52 y.o. Female with a PMH as listed below who presents to [facility] ED on 08/14/18 with c/o Shortness of Breath, fatigue, and urinating a lot  She was discharged from [facility] on 9/16 after treatment of [name] and new onset seizures.  She reports that since discharge home she has had no appetite and thus did not take her home Lantus and insulin. She denies cough, fever, chills, wheezing, sick contacts, or edema.  Initial workup in the ED reveals Venous Blood Gas pH 7.07 / CO2 19 / O2 70, Glucose 1015, Beta-Hydroxybutyric acid >8, serum Bicarb <7,  Na 126, K > 7.5, Creatinine 4.48, negative troponin, BNP 396, and WBC 8.3.  CXR is concerning for mild cardiomegaly and pulmonary vascular congestion.  She is being admitted to [place] unit for treatment of [name] and AKI.  PCCM is consulted for further management.  PAST MEDICAL HISTORY :   has a past medical history of Breast cancer ([place]), Chronic diarrhea, Diabetes mellitus ([place]), Diabetic nephropathy ([place]), Hyperlipidemia, Hypertension, Nephrotic syndrome, Normocytic anemia, Seizures ([place]), Subclinical hypothyroidism, Tobacco use, and Vitamin D deficiency.  has a past surgical history that includes Partial hysterectomy and Breast surgery. Prior to Admission medications   Medication Sig Start Date End Date Taking? Authorizing Provider  amLODipine (NORVASC) 5 MG tablet Take 5 mg by mouth daily. 08/01/18  Yes [provider]  atorvastatin (LIPITOR) 40 MG tablet Take 0.5 tablets (20 mg total) by mouth daily. 07/11/18  Yes Elgergawy, Silver Huguenin, MD  insulin aspart (NOVOLOG) 100  UNIT/ML injection Inject 4 Units into the skin 3 (three) times daily with meals. Please take fingersticks reading more than 200 07/11/18  Yes Elgergawy, Silver Huguenin, MD  insulin glargine (LANTUS) 100 UNIT/ML injection Inject 0.15 mLs (15 Units total) into the skin daily. 08/13/18  Yes Mody, Ulice Bold, MD  tamoxifen (NOLVADEX) 20 MG tablet Take 20 mg by mouth daily. 08/01/18  Yes [provider]  Vitamin D, Ergocalciferol, (DRISDOL) 50000 units CAPS capsule Take 50,000 Units by mouth every 7 (seven) days. 07/26/18  Yes [provider]  cephALEXin (KEFLEX) 250 MG capsule Take 1 capsule (250 mg total) by mouth 2 (two) times daily for 3 days. 08/13/18 08/16/18  Bettey Costa, MD  lacosamide 100 MG TABS Take 1 tablet (100 mg total) by mouth 2 (two) times daily. 08/13/18   Bettey Costa, MD   Allergies  Allergen Reactions  . Penicillins Other (See Comments)    Pt reports hallucinations when taking penicillins. >Has patient had a PCN reaction causing immediate rash, facial/tongue/throat swelling, SOB or lightheadedness with hypotension: No Has patient had a PCN reaction causing severe rash involving mucus membranes or skin necrosis: No Has patient had a PCN reaction that required hospitalization No Has patient had a PCN reaction occurring within the last 10 years: No If all of the above answers are "NO", then may proceed with Cephalosporin use.    FAMILY HISTORY:  family history includes Diabetes in her unknown relative. SOCIAL HISTORY:  reports that she has been smoking cigarettes. She has been smoking about 0.50 packs per day. She  has never used smokeless tobacco. She reports that she does not drink alcohol or use drugs.  REVIEW OF SYSTEMS:  Positives in BOLD Constitutional: Negative for fever, chills, weight loss, +malaise/fatigue and diaphoresis.  HENT: Negative for hearing loss, ear pain, nosebleeds, congestion, sore throat, neck pain, tinnitus and ear discharge.   Eyes: Negative for blurred  vision, double vision, photophobia, pain, discharge and redness.  Respiratory: Negative for cough, hemoptysis, sputum production, shortness of breath, wheezing and stridor.   Cardiovascular: Negative for chest pain, palpitations, orthopnea, claudication, leg swelling and PND.  Gastrointestinal: Negative for heartburn, nausea, vomiting, abdominal pain, diarrhea, constipation, blood in stool and melena.  Genitourinary: Negative for dysuria, urgency, frequency, hematuria and flank pain.  Musculoskeletal: Negative for myalgias, back pain, joint pain and falls.  Skin: Negative for itching and rash.  Neurological: Negative for dizziness, tingling, tremors, sensory change, speech change, focal weakness, seizures, loss of consciousness, weakness and headaches.  Endo/Heme/Allergies: Negative for environmental allergies and polydipsia. Does not bruise/bleed easily.  SUBJECTIVE:  Pt denies shortness of breath, chest pain, palpitations Denies cough, fever/chills, nausea/vomiting Pt reports she feels weak and tired  VITAL SIGNS: Temp:  [99.6 F (37.6 C)] 99.6 F (37.6 C) (09/17 1802) Pulse Rate:  [82-111] 106 (09/17 1930) Resp:  [18-29] 27 (09/17 1930) BP: (132-178)/(56-89) 155/68 (09/17 1930) SpO2:  [99 %-100 %] 100 % (09/17 1930) Weight:  [70.7 kg] 70.7 kg (09/17 1802)  PHYSICAL EXAMINATION: General:  Acutely ill appearing female, laying in bed, on nasal cannula, in NAD Neuro:  Lethargic, arouses to voice and follows commands, Oriented x4, no focal deficits HEENT:  Atraumatic, normocephalic, neck supple, no JVD Cardiovascular:  RRR, s1s2, no M/R/G, 2+ pulses throughout Lungs:  Clear bilaterally, even , nonlabored, normal effort Abdomen:  Soft, nontender, nondistended, BS+ x4 Musculoskeletal:  No deformities, normal bulk and tone Skin:  Warm/dry.  No obvious rashes, lesions, or ulcerations  Recent Labs  Lab 08/12/18 1412 08/13/18 0220 08/14/18 1813  NA 133* 130* 126*  K 4.6 5.0 >7.5*    CL 104 103 97*  CO2 21* 17* <7*  BUN 42* 41* 54*  CREATININE 3.55* 3.64* 4.48*  GLUCOSE 150* 430* 1,015*   Recent Labs  Lab 08/11/18 1217 08/12/18 0205 08/14/18 1813  HGB 9.3* 8.4* 8.3*  HCT 28.6* 24.9* 28.3*  WBC 7.9 8.2 9.2  PLT 243 221 264   Dg Chest Port 1 View  Result Date: 08/14/2018 CLINICAL DATA:  Shortness of breath since yesterday. Hyperventilation. History of breast cancer. EXAM: PORTABLE CHEST 1 VIEW COMPARISON:  Chest radiograph July 09, 2018 FINDINGS: Cardiac silhouette is mildly enlarged. Pulmonary vascular congestion without pleural effusion or focal consolidation. No pneumothorax. Single-lumen LEFT chest Port-A-Cath in situ. Surgical clips RIGHT breast. On IMPRESSION: Mild cardiomegaly and pulmonary vascular congestion. Electronically Signed   By: Elon Alas M.D.   On: 08/14/2018 18:59    ASSESSMENT / PLAN:  A: [name] Hyperkalemia in setting of acidosis and AKI Hypertonic Hyponatremia in setting of elevated glucose -Corrected Na 141 given glucose AKI in setting of dehydration on CKD Stage IV Anion gap metabolic acidosis in setting of [name] Elevated BNP 396 with some pulmonary vascular congestion on CXR HTN Anemia of Chronic Disease P: -IV insulin gtt -IVF -Follow [name] protocol -BMP q4h -Pt received 1g Calcium gluconate,10 units Insulin, and 2 amps Bicarb in ED -Follow up K -Monitor I&O's / urinary output -Follow BMP -Ensure adequate renal perfusion -Avoid nephrotoxic agents as able -Replace electrolytes as indicated -Obtain ECHO  -  Given pt's [name] status, will continue with IVF for now -Continue home Norvasc -Prn Hydralazine IV for SBP >170 -Monitor for s/sx of bleeding -Follow CBC -Heparin SQ for VTE prophylaxis -Transfuse for Hgb<7 -Obtain UA   Disposition: Stepdown Goals of Care: Full code VTE prophylaxis: Heparin SQ Updates: Updated pt at bedside 08/14/18   Darel Hong, AGACNP-BC [facility]  Medicine Pager: [phone]   08/14/2018, 7:44 PM

## 2018-08-15 ENCOUNTER — Inpatient Hospital Stay
Admit: 2018-08-15 | Discharge: 2018-08-15 | Disposition: A | Discharge status: Home / Self Care | Attending: Internal Medicine | Admitting: Internal Medicine

## 2018-08-15 LAB — BASIC METABOLIC PANEL
ANION GAP: 10 (ref 5–15)
ANION GAP: 13 (ref 5–15)
ANION GAP: 17 — AB (ref 5–15)
BUN: 53 mg/dL — ABNORMAL HIGH (ref 6–20)
BUN: 52 mg/dL — AB (ref 6–20)
BUN: 55 mg/dL — AB (ref 6–20)
CHLORIDE: 108 mmol/L (ref 98–111)
CO2: 10 mmol/L — ABNORMAL LOW (ref 22–32)
CO2: 14 mmol/L — AB (ref 22–32)
CO2: 16 mmol/L — AB (ref 22–32)
Calcium: 6.3 mg/dL — CL (ref 8.9–10.3)
Calcium: 6.3 mg/dL — CL (ref 8.9–10.3)
Calcium: 6.4 mg/dL — CL (ref 8.9–10.3)
Chloride: 106 mmol/L (ref 98–111)
Chloride: 110 mmol/L (ref 98–111)
Creatinine, Ser: 3.87 mg/dL — ABNORMAL HIGH (ref 0.44–1.00)
Creatinine, Ser: 3.88 mg/dL — ABNORMAL HIGH (ref 0.44–1.00)
Creatinine, Ser: 4.11 mg/dL — ABNORMAL HIGH (ref 0.44–1.00)
GFR calc Af Amer: 14 mL/min — ABNORMAL LOW (ref >60)
GFR calc Af Amer: 14 mL/min — ABNORMAL LOW (ref >60)
GFR, EST AFRICAN AMERICAN: 13 mL/min — AB (ref >60)
GFR, EST NON AFRICAN AMERICAN: 12 mL/min — AB (ref >60)
GFR, EST NON AFRICAN AMERICAN: 12 mL/min — AB (ref >60)
GFR, EST NON AFRICAN AMERICAN: 12 mL/min — AB (ref >60)
GLUCOSE: 158 mg/dL — AB (ref 70–99)
GLUCOSE: 224 mg/dL — AB (ref 70–99)
GLUCOSE: 553 mg/dL — AB (ref 70–99)
POTASSIUM: 4.3 mmol/L (ref 3.5–5.1)
POTASSIUM: 4.9 mmol/L (ref 3.5–5.1)
POTASSIUM: 5.2 mmol/L — AB (ref 3.5–5.1)
Sodium: 133 mmol/L — ABNORMAL LOW (ref 135–145)
Sodium: 135 mmol/L (ref 135–145)
Sodium: 136 mmol/L (ref 135–145)

## 2018-08-15 LAB — RETICULOCYTES
RBC.: 2.43 MIL/uL — ABNORMAL LOW (ref 3.80–5.20)
RETIC COUNT ABSOLUTE: 63.2 10*3/uL (ref 19.0–183.0)
RETIC CT PCT: 2.6 % (ref 0.4–3.1)

## 2018-08-15 LAB — GLUCOSE, CAPILLARY
GLUCOSE-CAPILLARY: 127 mg/dL — AB (ref 70–99)
GLUCOSE-CAPILLARY: 195 mg/dL — AB (ref 70–99)
GLUCOSE-CAPILLARY: 248 mg/dL — AB (ref 70–99)
GLUCOSE-CAPILLARY: 269 mg/dL — AB (ref 70–99)
GLUCOSE-CAPILLARY: 359 mg/dL — AB (ref 70–99)
GLUCOSE-CAPILLARY: 404 mg/dL — AB (ref 70–99)
GLUCOSE-CAPILLARY: 479 mg/dL — AB (ref 70–99)
GLUCOSE-CAPILLARY: 58 mg/dL — AB (ref 70–99)
GLUCOSE-CAPILLARY: 83 mg/dL (ref 70–99)
Glucose-Capillary: 140 mg/dL — ABNORMAL HIGH (ref 70–99)
Glucose-Capillary: 84 mg/dL (ref 70–99)
Glucose-Capillary: 203 mg/dL — ABNORMAL HIGH (ref 70–99)
Glucose-Capillary: 125 mg/dL — ABNORMAL HIGH (ref 70–99)

## 2018-08-15 LAB — FOLATE: Folate: 8.8 ng/mL (ref >5.9)

## 2018-08-15 LAB — CBC
HCT: 21.9 % — ABNORMAL LOW (ref 35.0–47.0)
Hemoglobin: 7.4 g/dL — ABNORMAL LOW (ref 12.0–16.0)
MCH: 30.2 pg (ref 26.0–34.0)
MCHC: 33.8 g/dL (ref 32.0–36.0)
MCV: 89.3 fL (ref 80.0–100.0)
PLATELETS: 250 10*3/uL (ref 150–440)
RBC: 2.45 MIL/uL — ABNORMAL LOW (ref 3.80–5.20)
RDW: 14.9 % — AB (ref 11.5–14.5)
WBC: 13.6 10*3/uL — ABNORMAL HIGH (ref 3.6–11.0)

## 2018-08-15 LAB — ECHOCARDIOGRAM COMPLETE
Height: 65 in
Weight: 2493.84 oz

## 2018-08-15 LAB — IRON AND TIBC
Iron: 27 ug/dL — ABNORMAL LOW (ref 28–170)
Saturation Ratios: 15 % (ref 10.4–31.8)
TIBC: 179 ug/dL — ABNORMAL LOW (ref 250–450)
UIBC: 152 ug/dL

## 2018-08-15 LAB — FERRITIN: Ferritin: 1302 ng/mL — ABNORMAL HIGH (ref 11–307)

## 2018-08-15 MED ORDER — INSULIN GLARGINE 100 UNIT/ML ~~LOC~~ SOLN
15.0000 [IU] | Freq: Every day | SUBCUTANEOUS | Status: DC
  Administered 2018-08-15 – 2018-08-16 (×2): 15 [IU] via SUBCUTANEOUS
  Filled 2018-08-15 (×3): qty 0.15

## 2018-08-15 MED ORDER — DEXTROSE 50 % IV SOLN: 17.0000 mL | Freq: Once | INTRAVENOUS | Status: AC

## 2018-08-15 MED ORDER — DEXTROSE 50 % IV SOLN
INTRAVENOUS | Status: AC
  Administered 2018-08-15: 17 mL
  Filled 2018-08-15: qty 50

## 2018-08-15 MED ORDER — INSULIN ASPART 100 UNIT/ML ~~LOC~~ SOLN
4.0000 [IU] | Freq: Three times a day (TID) | SUBCUTANEOUS | Status: DC
  Administered 2018-08-15 – 2018-08-16 (×5): 4 [IU] via SUBCUTANEOUS
  Filled 2018-08-15 (×5): qty 1

## 2018-08-15 MED ORDER — RAMELTEON 8 MG PO TABS
8.0000 mg | ORAL_TABLET | Freq: Every day | ORAL | Status: DC
  Administered 2018-08-15: 8 mg via ORAL
  Filled 2018-08-15 (×3): qty 1

## 2018-08-15 MED ORDER — ATORVASTATIN CALCIUM 20 MG PO TABS
20.0000 mg | ORAL_TABLET | Freq: Every day | ORAL | Status: DC
  Administered 2018-08-15 – 2018-08-16 (×2): 20 mg via ORAL
  Filled 2018-08-15 (×2): qty 1

## 2018-08-15 MED ORDER — INSULIN ASPART 100 UNIT/ML ~~LOC~~ SOLN: 0.0000 [IU] | Freq: Every day | SUBCUTANEOUS | Status: DC

## 2018-08-15 MED ORDER — INSULIN ASPART 100 UNIT/ML ~~LOC~~ SOLN
0.0000 [IU] | Freq: Three times a day (TID) | SUBCUTANEOUS | Status: DC
  Administered 2018-08-15: 3 [IU] via SUBCUTANEOUS
  Administered 2018-08-16 (×2): 2 [IU] via SUBCUTANEOUS
  Administered 2018-08-16: 7 [IU] via SUBCUTANEOUS
  Filled 2018-08-15 (×4): qty 1

## 2018-08-15 MED ORDER — TAMOXIFEN CITRATE 10 MG PO TABS
20.0000 mg | ORAL_TABLET | Freq: Every day | ORAL | Status: DC
  Administered 2018-08-15 – 2018-08-16 (×2): 20 mg via ORAL
  Filled 2018-08-15 (×2): qty 2

## 2018-08-15 MED ORDER — HYDRALAZINE HCL 20 MG/ML IJ SOLN: 10.0000 mg | Freq: Four times a day (QID) | INTRAMUSCULAR | Status: DC | PRN

## 2018-08-15 NOTE — Progress Notes (Signed)
*  PRELIMINARY RESULTS* Echocardiogram 2D Echocardiogram has been performed.  Alyssa Floyd Alyssa Floyd 08/15/2018, 2:48 PM

## 2018-08-15 NOTE — Progress Notes (Signed)
Plan to wean off insulin drip and transfer to gen med floor

## 2018-08-15 NOTE — Progress Notes (Signed)
Inpatient Diabetes Program Recommendations  AACE/ADA: New Consensus Statement on Inpatient Glycemic Control (2019)  Target Ranges:  Prepandial:   less than 140 mg/dL      Peak postprandial:   less than 180 mg/dL (1-2 hours)      Critically ill patients:  140 - 180 mg/dL   Results for Alyssa Floyd, Alyssa Floyd (MRN [id number]) as of 08/15/2018 07:19  Ref. Range 08/14/2018 21:27 08/14/2018 22:23 08/14/2018 23:24 08/15/2018 00:40 08/15/2018 01:27 08/15/2018 02:26 08/15/2018 03:23 08/15/2018 04:30 08/15/2018 05:30 08/15/2018 06:36  Glucose-Capillary Latest Ref Range: 70 - 99 mg/dL >600 (HH) 574 (HH) 544 (HH) 479 (H) 404 (H) 359 (H) 269 (H) 195 (H) 140 (H) 127 (H)  Results for Alyssa Floyd, Alyssa Floyd (MRN [id number]) as of 08/15/2018 07:19  Ref. Range 08/14/2018 18:13 08/14/2018 18:29  Beta-Hydroxybutyric Acid Latest Ref Range: 0.05 - 0.27 mmol/L  >8.00 (H)  Glucose Latest Ref Range: 70 - 99 mg/dL 1,015 (HH)    Review of Glycemic Control  Diabetes history: DM1 Outpatient Diabetes medications: Lantus 15 units daily, Novolog 4 units TID with meals for meal coverage Current orders for Inpatient glycemic control: IV insulin drip per [name] protocol  Inpatient Diabetes Program Recommendations:   Insulin Transition from IV to SQ: Once acidosis is cleared and MD is ready to transition from IV to SQ insulin, please consider ordering Lantus 18 units Q24H, CBGs with Novolog 0-9 units Q4H, and when diet is ordered Novolog 3 units TID with meals for meal coverage if patient eats at least 50% of meals.  HgbA1C: A1C 13.6% on 08/11/18 indicating an average glucose of 344 mg/dl. Patient has an initial appointment already scheduled for 08/22/18@2pm  with Endocrine Fellow at [facility] and needs to follow through and go to appointment for assistance with DM management.  NOTE: Patient was just recently inpatient with new onset seizures and [name] 08/11/18 to 08/13/18. Diabetes Coordinator spoke with patient during last two hospital  admission (on 07/09/18 and on 08/13/18). Per H&P, patient did not take insulin after discharged on 08/13/18. Patient reports that she has Lantus and Novolog at home and she has been asked to take insulin consistently at home despite not being able to eat. However, patient still does not take insulin consistently. Patient has an appointment with  Endocrinology on 08/22/18 @ 2pm (per Care Everywhere PCP note). Patient had a prior appointment on 07/13/18 but did not go to appointment. Patient needs to follow through and take insulin as prescribed and go to appointment already scheduled with Endocrinology.  Thanks, Barnie Alderman, RN, MSN, CDE Diabetes Coordinator Inpatient Diabetes Program [phone] (Team Pager from 8am to 5pm)

## 2018-08-15 NOTE — Progress Notes (Signed)
At the request of the patient's nurse, [place] visited the patient who was teary and "stressed out" about health and living concerns. Chaplain provided active listening and reflective questioning as patient processed a number of interrelated issues. Chaplain prayed with the patient, who appeared calmer.  Of note, the patient has concerns about doctors/nurses sharing her medical information with a cousin, who supposedly claimed to be the patient's contact person. Patient was not clear how this happened. Chaplain recommended the completion of a HCPOA to designate a Health Care Agent. An OR for an AD was processed for completion on 08/16/2018.

## 2018-08-15 NOTE — Progress Notes (Signed)
[place] at [place] NAME: Kalley Nicholl    MR#:  [id number]  DATE OF BIRTH:  1966/03/15  SUBJECTIVE:   Here with shortness of breath.  She was discharged and reports that she was taking her insulin however she was also found to be in [name] which is now resolved  REVIEW OF SYSTEMS:    Review of Systems  Constitutional: Negative for fever, chills weight loss HENT: Negative for ear pain, nosebleeds, congestion, facial swelling, rhinorrhea, neck pain, neck stiffness and ear discharge.   Respiratory: Positive for shortness of breath  cardiovascular: Negative for chest pain, palpitations and leg swelling.  Gastrointestinal: Negative for heartburn, abdominal pain, vomiting, diarrhea or consitpation Genitourinary: Negative for dysuria, urgency, frequency, hematuria Musculoskeletal: Negative for back pain or joint pain Neurological: Negative for dizziness, seizures, syncope, focal weakness,  numbness and headaches.  Hematological: Does not bruise/bleed easily.  Psychiatric/Behavioral: Negative for hallucinations, confusion, dysphoric mood    Tolerating Diet: yes      DRUG ALLERGIES:   Allergies  Allergen Reactions  . Penicillins Other (See Comments)    Pt reports hallucinations when taking penicillins. >Has patient had a PCN reaction causing immediate rash, facial/tongue/throat swelling, SOB or lightheadedness with hypotension: No Has patient had a PCN reaction causing severe rash involving mucus membranes or skin necrosis: No Has patient had a PCN reaction that required hospitalization No Has patient had a PCN reaction occurring within the last 10 years: No If all of the above answers are "NO", then may proceed with Cephalosporin use.    VITALS:  Blood pressure (!) 150/82, pulse 81, temperature 97.8 F (36.6 C), temperature source Oral, resp. rate 20, height 5\' 5"  (1.651 m), weight 70.7 kg, SpO2 100 %.  PHYSICAL EXAMINATION:   Constitutional: Appears well-developed and well-nourished. No distress. HENT: Normocephalic. Marland Kitchen Oropharynx is clear and moist.  Eyes: Conjunctivae and EOM are normal. PERRLA, no scleral icterus.  Neck: Normal ROM. Neck supple. No JVD. No tracheal deviation. CVS: RRR, S1/S2 +, no murmurs, no gallops, no carotid bruit.  Pulmonary: Effort and breath sounds normal, no stridor, rhonchi, wheezes, rales.  Abdominal: Soft. BS +,  no distension, tenderness, rebound or guarding.  Musculoskeletal: Normal range of motion. No edema and no tenderness.  Neuro: Alert. CN 2-12 grossly intact. No focal deficits. Skin: Skin is warm and dry. No rash noted. Psychiatric: Normal mood and affect.      LABORATORY PANEL:   CBC Recent Labs  Lab 08/15/18 0535  WBC 13.6*  HGB 7.4*  HCT 21.9*  PLT 250   ------------------------------------------------------------------------------------------------------------------  Chemistries  Recent Labs  Lab 08/13/18 0220  08/15/18 0535  NA 130*   < > 136  K 5.0   < > 4.3  CL 103   < > 110  CO2 17*   < > 16*  GLUCOSE 430*   < > 158*  BUN 41*   < > 53*  CREATININE 3.64*   < > 3.88*  CALCIUM 6.0*   < > 6.3*  MG 2.5*  --   --    < > = values in this interval not displayed.   ------------------------------------------------------------------------------------------------------------------  Cardiac Enzymes Recent Labs  Lab 08/14/18 1829  TROPONINI <0.03   ------------------------------------------------------------------------------------------------------------------  RADIOLOGY:  Dg Chest Port 1 View  Result Date: 08/14/2018 CLINICAL DATA:  Shortness of breath since yesterday. Hyperventilation. History of breast cancer. EXAM: PORTABLE CHEST 1 VIEW COMPARISON:  Chest radiograph July 09, 2018 FINDINGS: Cardiac silhouette is  mildly enlarged. Pulmonary vascular congestion without pleural effusion or focal consolidation. No pneumothorax. Single-lumen LEFT  chest Port-A-Cath in situ. Surgical clips RIGHT breast. On IMPRESSION: Mild cardiomegaly and pulmonary vascular congestion. Electronically Signed   By: Elon Alas M.D.   On: 08/14/2018 18:59     ASSESSMENT AND PLAN:   52 year old female with history of diabetes and chronic kidney disease stage IV who presents with shortness of breath and [name].  1.  [name]: Patient was admitted to stepdown and was on [name] protocol.  Her [name] is resolved.  2.  Acute on chronic kidney disease stage IV: Nephrology consultation for further evaluation and management.  Patient may be approaching dialysis.  3.  Acute hypoxic respiratory failure with shortness of breath: Follow-up on echocardiogram and nephrology recommendations. Lasix x 1 today  4.  Anemia of chronic disease with hemoglobin of 7.4 Management as per nephrology Anemia panel ordered  5.  Recent new onset seizure during last hospital stay: Continue Vimpat  6.  Hyperlipidemia: Continue atorvastatin  7.  History of breast cancer on tamoxifen  8.  Essential hypertension: Continue Norvasc  Management plans discussed with the patient and she is in agreement.  CODE STATUS: full  TOTAL TIME TAKING CARE OF THIS PATIENT: 30 minutes.     POSSIBLE D/C 2 days, DEPENDING ON CLINICAL CONDITION.   Shareef Eddinger M.D on 08/15/2018 at 11:08 AM  Between 7am to 6pm - Pager - [phone] After 6pm go to [email or web address] - password [email or web address] [facility]  Office  [phone]  CC: Primary care physician; Hemming, Claudette Laws, MD  Note: This dictation was prepared with Dragon dictation along with smaller phrase technology. Any transcriptional errors that result from this process are unintentional.

## 2018-08-15 NOTE — Progress Notes (Signed)
Report given to [place] RN. Pt transferred with no difficulty.

## 2018-08-15 NOTE — Care Management Note (Signed)
Case Management Note  Patient Details  Name: Alyssa Floyd MRN: [id number] Date of Birth: 08-07-1966  Subjective/Objective:                 Three admissions in past 6 months. Last discharge 08/13/2018 with new onset seizures and [name]. Admitted 9/17 with [name].  Patient says she presented due to shortness of breath.  She does not appear to have underlying chronic cardiopulmonary diagnosis.  Does not drive since onset of new seizure.  Her cousin will take her to appointments.  She has [employer] and denied issues obtaining her medications. Checks her blood sugar three times daily. She would be in agreement to have home health nurse to follow.  It is hoped that this will decrease admissions. Her current need for supplemental oxygen is acute. Confirmed her contact information. Independent in her adls   Action/Plan:  Referral to [facility] for home health nurse. Will need home oxygen assessment prior to discharge  Expected Discharge Date:  08/13/18               Expected Discharge Plan:     In-House Referral:     Discharge planning Services     Post Acute Care Choice:    Choice offered to:     DME Arranged:    DME Agency:     HH Arranged:    HH Agency:     Status of Service:     If discussed at [employer], dates discussed:    Additional Comments:  Katrina Stack, RN 08/15/2018, 8:46 AM

## 2018-08-16 LAB — BASIC METABOLIC PANEL
Anion gap: 10 (ref 5–15)
BUN: 53 mg/dL — AB (ref 6–20)
CO2: 14 mmol/L — ABNORMAL LOW (ref 22–32)
Calcium: 6.2 mg/dL — CL (ref 8.9–10.3)
Chloride: 108 mmol/L (ref 98–111)
Creatinine, Ser: 4.49 mg/dL — ABNORMAL HIGH (ref 0.44–1.00)
GFR calc Af Amer: 12 mL/min — ABNORMAL LOW (ref >60)
GFR calc non Af Amer: 10 mL/min — ABNORMAL LOW (ref >60)
GLUCOSE: 238 mg/dL — AB (ref 70–99)
POTASSIUM: 4.3 mmol/L (ref 3.5–5.1)
Sodium: 132 mmol/L — ABNORMAL LOW (ref 135–145)

## 2018-08-16 LAB — CBC
HEMATOCRIT: 21.8 % — AB (ref 35.0–47.0)
HEMOGLOBIN: 7.3 g/dL — AB (ref 12.0–16.0)
MCH: 30.3 pg (ref 26.0–34.0)
MCHC: 33.6 g/dL (ref 32.0–36.0)
MCV: 90.2 fL (ref 80.0–100.0)
Platelets: 241 10*3/uL (ref 150–440)
RBC: 2.42 MIL/uL — ABNORMAL LOW (ref 3.80–5.20)
RDW: 16 % — AB (ref 11.5–14.5)
WBC: 9.2 10*3/uL (ref 3.6–11.0)

## 2018-08-16 LAB — GLUCOSE, CAPILLARY
GLUCOSE-CAPILLARY: 318 mg/dL — AB (ref 70–99)
GLUCOSE-CAPILLARY: 194 mg/dL — AB (ref 70–99)
GLUCOSE-CAPILLARY: 166 mg/dL — AB (ref 70–99)
Glucose-Capillary: 179 mg/dL — ABNORMAL HIGH (ref 70–99)
Glucose-Capillary: 273 mg/dL — ABNORMAL HIGH (ref 70–99)

## 2018-08-16 LAB — VITAMIN B12: VITAMIN B 12: 640 pg/mL (ref 180–914)

## 2018-08-16 MED ORDER — SODIUM BICARBONATE 650 MG PO TABS: 650.0000 mg | ORAL_TABLET | Freq: Two times a day (BID) | ORAL | 0 refills | Status: DC

## 2018-08-16 MED ORDER — CALCIUM ACETATE (PHOS BINDER) 667 MG PO CAPS
667.0000 mg | ORAL_CAPSULE | Freq: Three times a day (TID) | ORAL | Status: DC
  Administered 2018-08-16 (×2): 667 mg via ORAL
  Filled 2018-08-16 (×2): qty 1

## 2018-08-16 MED ORDER — INSULIN GLARGINE 100 UNIT/ML ~~LOC~~ SOLN: 18.0000 [IU] | Freq: Every day | SUBCUTANEOUS | 11 refills | Status: DC

## 2018-08-16 MED ORDER — CALCIUM ACETATE (PHOS BINDER) 667 MG PO CAPS: 667.0000 mg | ORAL_CAPSULE | Freq: Three times a day (TID) | ORAL | 0 refills | Status: DC

## 2018-08-16 MED ORDER — SODIUM BICARBONATE 650 MG PO TABS
650.0000 mg | ORAL_TABLET | Freq: Two times a day (BID) | ORAL | Status: DC
  Administered 2018-08-16: 650 mg via ORAL
  Filled 2018-08-16: qty 1

## 2018-08-16 NOTE — Care Management Note (Addendum)
Case Management Note  Patient Details  Name: Alyssa Floyd MRN: [id number] Date of Birth: 1966/11/01   Patient to discharge home today.  Malachy Mood with [facility] notified of discharge.  Start of care for 9/20. RNCM signing off   Update:  PT recommends RW.  Patient declines   Subjective/Objective:                    Action/Plan:   Expected Discharge Date:  08/16/18               Expected Discharge Plan:  [place]  In-House Referral:     Discharge planning Services  CM Consult  Post Acute Care Choice:    Choice offered to:  Patient  DME Arranged:    DME Agency:     HH Arranged:  RN, PT, Nurse's Aide [place] Agency:  [place]  Status of Service:  Completed, signed off  If discussed at [place] of Stay Meetings, dates discussed:    Additional Comments:  Beverly Sessions, RN 08/16/2018, 1:58 PM

## 2018-08-16 NOTE — Progress Notes (Signed)
[place] [facility] [place] NAME: Alyssa Floyd    MR#:  [id number]  DATE OF BIRTH:  07/17/1966  SUBJECTIVE:   Patient is doing well this morning.  Reports no shortness of breath or chest pain.  REVIEW OF SYSTEMS:    Review of Systems  Constitutional: Negative for fever, chills weight loss HENT: Negative for ear pain, nosebleeds, congestion, facial swelling, rhinorrhea, neck pain, neck stiffness and ear discharge.   Respiratory: denies shortness of breath, PND or respiratory discomfort  cardiovascular: Negative for chest pain, palpitations and leg swelling.  Gastrointestinal: Negative for heartburn, abdominal pain, vomiting, diarrhea or consitpation Genitourinary: Negative for dysuria, urgency, frequency, hematuria Musculoskeletal: Negative for back pain or joint pain Neurological: Negative for dizziness, seizures, syncope, focal weakness,  numbness and headaches.  Hematological: Does not bruise/bleed easily.  Psychiatric/Behavioral: Negative for hallucinations, confusion, dysphoric mood    Tolerating Diet: yes      DRUG ALLERGIES:   Allergies  Allergen Reactions  . Penicillins Other (See Comments)    Pt reports hallucinations when taking penicillins. >Has patient had a PCN reaction causing immediate rash, facial/tongue/throat swelling, SOB or lightheadedness with hypotension: No Has patient had a PCN reaction causing severe rash involving mucus membranes or skin necrosis: No Has patient had a PCN reaction that required hospitalization No Has patient had a PCN reaction occurring within the last 10 years: No If all of the above answers are "NO", then may proceed with Cephalosporin use.    VITALS:  Blood pressure 121/63, pulse 87, temperature 99 F (37.2 C), temperature source Oral, resp. rate 18, height 5\' 5"  (1.651 m), weight 70.7 kg, SpO2 99 %.  PHYSICAL EXAMINATION:  Constitutional: Appears well-developed and well-nourished. No  distress. HENT: Normocephalic. Marland Kitchen Oropharynx is clear and moist.  Eyes: Conjunctivae and EOM are normal. PERRLA, no scleral icterus.  Neck: Normal ROM. Neck supple. No JVD. No tracheal deviation. CVS: RRR, S1/S2 +, no murmurs, no gallops, no carotid bruit.  Pulmonary: Effort and breath sounds normal, no stridor, rhonchi, wheezes, rales.  Abdominal: Soft. BS +,  no distension, tenderness, rebound or guarding.  Musculoskeletal: Normal range of motion. No edema and no tenderness.  Neuro: Alert. CN 2-12 grossly intact. No focal deficits. Skin: Skin is warm and dry. No rash noted. Psychiatric: Normal mood and affect.      LABORATORY PANEL:   CBC Recent Labs  Lab 08/16/18 1007  WBC 9.2  HGB 7.3*  HCT 21.8*  PLT 241   ------------------------------------------------------------------------------------------------------------------  Chemistries  Recent Labs  Lab 08/13/18 0220  08/16/18 1007  NA 130*   < > 132*  K 5.0   < > 4.3  CL 103   < > 108  CO2 17*   < > 14*  GLUCOSE 430*   < > 238*  BUN 41*   < > 53*  CREATININE 3.64*   < > 4.49*  CALCIUM 6.0*   < > 6.2*  MG 2.5*  --   --    < > = values in this interval not displayed.   ------------------------------------------------------------------------------------------------------------------  Cardiac Enzymes Recent Labs  Lab 08/14/18 1829  TROPONINI <0.03   ------------------------------------------------------------------------------------------------------------------  RADIOLOGY:  Dg Chest Port 1 View  Result Date: 08/14/2018 CLINICAL DATA:  Shortness of breath since yesterday. Hyperventilation. History of breast cancer. EXAM: PORTABLE CHEST 1 VIEW COMPARISON:  Chest radiograph July 09, 2018 FINDINGS: Cardiac silhouette is mildly enlarged. Pulmonary vascular congestion without pleural effusion or focal consolidation. No pneumothorax.  Single-lumen LEFT chest Port-A-Cath in situ. Surgical clips RIGHT breast. On  IMPRESSION: Mild cardiomegaly and pulmonary vascular congestion. Electronically Signed   By: Elon Alas M.D.   On: 08/14/2018 18:59     ASSESSMENT AND PLAN:   52 year old female with history of diabetes and chronic kidney disease stage IV who presents with shortness of breath and [name].  1.  [name]: Patient was admitted to stepdown and was on [name] protocol.  Her [name] is resolved. We will continue with insulin and ADA diet She will have outpatient follow-up with endocrinology.    2.  Acute on chronic kidney disease stage IV: Nephrology consultation for further evaluation and management.  Patient may be approaching dialysis. Creatinine has increased Discussed with Dr. Juleen China this morning.   3.  Acute hypoxic respiratory failure with shortness of breath: Echocardiogram shows normal ejection fraction with no diastolic dysfunction.  She does not have signs of congestive heart failure.  She has been weaned off of oxygen.   4.  Anemia of chronic disease with hemoglobin of 7.3 which is relatively stable Iron panel consistent with chronic disease  5.  Recent new onset seizure during last hospital stay: Continue Vimpat  6.  Hyperlipidemia: Continue atorvastatin  7.  History of breast cancer on tamoxifen  8.  Essential hypertension: Continue Norvasc 9.  Hypocalcemia from chronic kidney disease   Management plans discussed with the patient and she is in agreement.  CODE STATUS: full  TOTAL TIME TAKING CARE OF THIS PATIENT: 29 minutes.     POSSIBLE D/C 2 days, DEPENDING ON CLINICAL CONDITION.   Natallia Stellmach M.D on 08/16/2018 at 10:44 AM  Between 7am to 6pm - Pager - [phone] After 6pm go to [email or web address] - password [email or web address] [facility]  Office  [phone]  CC: Primary care physician; Hemming, Claudette Laws, MD  Note: This dictation was prepared with Dragon dictation along with smaller phrase technology. Any transcriptional errors that result from this  process are unintentional.

## 2018-08-16 NOTE — Progress Notes (Signed)
CRITICAL VALUE ALERT  Critical Value:  Ca 6.2  Date & Time Notied:  08/16/18 1041  Provider Notified: Dr. Benjie Karvonen  Orders Received/Actions taken: acknowledged/no new orders

## 2018-08-16 NOTE — Discharge Summary (Signed)
[place] [facility] [place] NAME: Alyssa Floyd    MR#:  [id number]  DATE OF BIRTH:  1966-07-04  DATE OF ADMISSION:  08/14/2018 ADMITTING PHYSICIAN: Demetrios Loll, MD  DATE OF DISCHARGE: 08/16/2018  PRIMARY CARE PHYSICIAN: Hemming, Claudette Laws, MD    ADMISSION DIAGNOSIS:  Diabetic ketoacidosis without coma associated with other specified diabetes mellitus ([place]) [E13.10]  DISCHARGE DIAGNOSIS:  Active Problems:   [name], type 1 ([place])   SECONDARY DIAGNOSIS:   Past Medical History:  Diagnosis Date  . Breast cancer ([place])   . 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 Diabetes mellitus ([place])   . Diabetic nephropathy ([place])   . Hyperlipidemia   . Hypertension   . Nephrotic syndrome    diabetic nephropathy, biopsy on 01/06/16 at [facility]  . Normocytic anemia   . Seizures ([place])   . Subclinical hypothyroidism   . Tobacco use   . Vitamin D deficiency     HOSPITAL COURSE:  52 year old female with history of diabetes and chronic kidney disease stage IV who presents with shortness of breath and [name].  1.  [name]: Patient was admitted to stepdown and was on [name] protocol.  Her [name] is resolved. . We will continue insulin with ADA diet she will have outpatient follow-up with endocrinology.    2.  Acute on chronic kidney disease stage IV: Patient is approaching dialysis.  Nephrology was consulted while patient in the hospital.  She is not mentally at the point to start dialysis.  She will follow-up with her nephrologist as an outpatient.  Due to low calcium and chronic kidney disease she is on bicarbonate and calcium upon discharge.   3.  Acute hypoxic respiratory failure with shortness of breath: Echocardiogram shows normal ejection fraction with no diastolic dysfunction.  She does not have signs of congestive heart failure.  She has been weaned off of oxygen.   4.  Anemia of chronic disease with hemoglobin of 7.3 which is relatively  stable Iron panel consistent with chronic disease She will have follow-up with her PCP 5.  Recent new onset seizure during last hospital stay: Continue Vimpat  6.  Hyperlipidemia: Continue atorvastatin  7.  History of breast cancer on tamoxifen  8.  Essential hypertension: Continue Norvasc 9.  Hypocalcemia from chronic kidney disease She has been started on calcium as per nephrology recommendations.  DISCHARGE CONDITIONS AND DIET:   Able for discharge on renal/diabetic diet  CONSULTS OBTAINED:  Treatment Team:  Lavonia Dana, MD  DRUG ALLERGIES:   Allergies  Allergen Reactions  . Penicillins Other (See Comments)    Pt reports hallucinations when taking penicillins. >Has patient had a PCN reaction causing immediate rash, facial/tongue/throat swelling, SOB or lightheadedness with hypotension: No Has patient had a PCN reaction causing severe rash involving mucus membranes or skin necrosis: No Has patient had a PCN reaction that required hospitalization No Has patient had a PCN reaction occurring within the last 10 years: No If all of the above answers are "NO", then may proceed with Cephalosporin use.    DISCHARGE MEDICATIONS:   Allergies as of 08/16/2018      Reactions   Penicillins Other (See Comments)   Pt reports hallucinations when taking penicillins. >Has patient had a PCN reaction causing immediate rash, facial/tongue/throat swelling, SOB or lightheadedness with hypotension: No Has patient had a PCN reaction causing severe rash involving mucus membranes or skin necrosis: No Has patient had a PCN reaction  that required hospitalization No Has patient had a PCN reaction occurring within the last 10 years: No If all of the above answers are "NO", then may proceed with Cephalosporin use.      Medication List    STOP taking these medications   cephALEXin 250 MG capsule Commonly known as:  KEFLEX     TAKE these medications   amLODipine 5 MG tablet Commonly known  as:  NORVASC Take 5 mg by mouth daily.   atorvastatin 40 MG tablet Commonly known as:  LIPITOR Take 0.5 tablets (20 mg total) by mouth daily.   calcium acetate 667 MG capsule Commonly known as:  PHOSLO Take 1 capsule (667 mg total) by mouth 3 (three) times daily with meals.   insulin aspart 100 UNIT/ML injection Commonly known as:  novoLOG Inject 4 Units into the skin 3 (three) times daily with meals. Please take fingersticks reading more than 200   insulin glargine 100 UNIT/ML injection Commonly known as:  LANTUS Inject 0.18 mLs (18 Units total) into the skin daily. Start taking on:  08/17/2018 What changed:  how much to take   Lacosamide 100 MG Tabs Take 1 tablet (100 mg total) by mouth 2 (two) times daily.   sodium bicarbonate 650 MG tablet Take 1 tablet (650 mg total) by mouth 2 (two) times daily.   tamoxifen 20 MG tablet Commonly known as:  NOLVADEX Take 20 mg by mouth daily.   Vitamin D (Ergocalciferol) 50000 units Caps capsule Commonly known as:  DRISDOL Take 50,000 Units by mouth every 7 (seven) days.         Today   CHIEF COMPLAINT:  Patient doing well this morning no shortness of breath or chest pain   VITAL SIGNS:  Blood pressure 121/63, pulse 87, temperature 99 F (37.2 C), temperature source Oral, resp. rate 18, height 5\' 5"  (1.651 m), weight 70.7 kg, SpO2 99 %.   REVIEW OF SYSTEMS:  Review of Systems  Constitutional: Negative.  Negative for chills, fever and malaise/fatigue.  HENT: Negative.  Negative for ear discharge, ear pain, hearing loss, nosebleeds and sore throat.   Eyes: Negative.  Negative for blurred vision and pain.  Respiratory: Negative.  Negative for cough, hemoptysis, shortness of breath and wheezing.   Cardiovascular: Negative.  Negative for chest pain, palpitations and leg swelling.  Gastrointestinal: Negative.  Negative for abdominal pain, blood in stool, diarrhea, nausea and vomiting.  Genitourinary: Negative.  Negative for  dysuria.  Musculoskeletal: Negative.  Negative for back pain.  Skin: Negative.   Neurological: Negative for dizziness, tremors, speech change, focal weakness, seizures and headaches.  Endo/Heme/Allergies: Negative.  Does not bruise/bleed easily.  Psychiatric/Behavioral: Negative.  Negative for depression, hallucinations and suicidal ideas.     PHYSICAL EXAMINATION:  GENERAL:  52 y.o.-year-old patient lying in the bed with no acute distress.  NECK:  Supple, no jugular venous distention. No thyroid enlargement, no tenderness.  LUNGS: Normal breath sounds bilaterally, no wheezing, rales,rhonchi  No use of accessory muscles of respiration.  CARDIOVASCULAR: S1, S2 normal. No murmurs, rubs, or gallops.  ABDOMEN: Soft, non-tender, non-distended. Bowel sounds present. No organomegaly or mass.  EXTREMITIES: No pedal edema, cyanosis, or clubbing.  PSYCHIATRIC: The patient is alert and oriented x 3.  SKIN: No obvious rash, lesion, or ulcer.   DATA REVIEW:   CBC Recent Labs  Lab 08/16/18 1007  WBC 9.2  HGB 7.3*  HCT 21.8*  PLT 241    Chemistries  Recent Labs  Lab 08/13/18 0220  08/16/18 1007  NA 130*   < > 132*  K 5.0   < > 4.3  CL 103   < > 108  CO2 17*   < > 14*  GLUCOSE 430*   < > 238*  BUN 41*   < > 53*  CREATININE 3.64*   < > 4.49*  CALCIUM 6.0*   < > 6.2*  MG 2.5*  --   --    < > = values in this interval not displayed.    Cardiac Enzymes Recent Labs  Lab 08/14/18 [place] <0.03    Microbiology Results  @[email or web address] @  RADIOLOGY:  Dg Chest Port 1 View  Result Date: 08/14/2018 CLINICAL DATA:  Shortness of breath since yesterday. Hyperventilation. History of breast cancer. EXAM: PORTABLE CHEST 1 VIEW COMPARISON:  Chest radiograph July 09, 2018 FINDINGS: Cardiac silhouette is mildly enlarged. Pulmonary vascular congestion without pleural effusion or focal consolidation. No pneumothorax. Single-lumen LEFT chest Port-A-Cath in situ. Surgical clips RIGHT  breast. On IMPRESSION: Mild cardiomegaly and pulmonary vascular congestion. Electronically Signed   By: Elon Alas M.D.   On: 08/14/2018 18:59      Allergies as of 08/16/2018      Reactions   Penicillins Other (See Comments)   Pt reports hallucinations when taking penicillins. >Has patient had a PCN reaction causing immediate rash, facial/tongue/throat swelling, SOB or lightheadedness with hypotension: No Has patient had a PCN reaction causing severe rash involving mucus membranes or skin necrosis: No Has patient had a PCN reaction that required hospitalization No Has patient had a PCN reaction occurring within the last 10 years: No If all of the above answers are "NO", then may proceed with Cephalosporin use.      Medication List    STOP taking these medications   cephALEXin 250 MG capsule Commonly known as:  KEFLEX     TAKE these medications   amLODipine 5 MG tablet Commonly known as:  NORVASC Take 5 mg by mouth daily.   atorvastatin 40 MG tablet Commonly known as:  LIPITOR Take 0.5 tablets (20 mg total) by mouth daily.   calcium acetate 667 MG capsule Commonly known as:  PHOSLO Take 1 capsule (667 mg total) by mouth 3 (three) times daily with meals.   insulin aspart 100 UNIT/ML injection Commonly known as:  novoLOG Inject 4 Units into the skin 3 (three) times daily with meals. Please take fingersticks reading more than 200   insulin glargine 100 UNIT/ML injection Commonly known as:  LANTUS Inject 0.18 mLs (18 Units total) into the skin daily. Start taking on:  08/17/2018 What changed:  how much to take   Lacosamide 100 MG Tabs Take 1 tablet (100 mg total) by mouth 2 (two) times daily.   sodium bicarbonate 650 MG tablet Take 1 tablet (650 mg total) by mouth 2 (two) times daily.   tamoxifen 20 MG tablet Commonly known as:  NOLVADEX Take 20 mg by mouth daily.   Vitamin D (Ergocalciferol) 50000 units Caps capsule Commonly known as:  DRISDOL Take 50,000  Units by mouth every 7 (seven) days.          Management plans discussed with the patient and she is in agreement. Stable for discharge home  Patient should follow up with pcp  CODE STATUS:     Code Status Orders  (From admission, onward)         Start     Ordered   08/14/18 1908  Full code  Continuous  08/14/18 1907        Code Status History    Date Active Date Inactive Code Status Order ID Comments User Context   08/11/2018 1355 08/14/2018 0044 Full Code [id number]  Loletha Grayer, MD ED   07/08/2018 0437 07/11/2018 2132 Full Code [id number]  Omar Person, NP ED   01/29/2016 1316 02/01/2016 1530 Full Code [id number]  Juluis Mire, MD ED      TOTAL TIME TAKING CARE OF THIS PATIENT: 38 minutes.    Note: This dictation was prepared with Dragon dictation along with smaller phrase technology. Any transcriptional errors that result from this process are unintentional.  Tayler Lassen M.D on 08/16/2018 at 11:06 AM  Between 7am to 6pm - Pager - [phone] After 6pm go to [email or web address] - password [email or web address] [facility]  Office  [phone]  CC: Primary care physician; Hemming, Claudette Laws, MD

## 2018-08-16 NOTE — Consult Note (Signed)
[facility]  CONSULT NOTE    Date: 08/16/2018                  Patient Name:  Alyssa Floyd  MRN: [id number]  DOB: 1966/06/30  Age / Sex: 52 y.o., female         PCP: Hemming, Claudette Laws, MD                 Service Requesting Consult: Dr. Benjie Karvonen                 Reason for Consult: Acute Renal Failure            History of Present Illness: Ms. Alyssa Floyd admitted to [facility] on 08/14/2018 for Diabetic ketoacidosis without coma associated with other specified diabetes mellitus ([place]) [E13.10]   Patient's anion gap acidosis has closed.   Patient was recently admitted to [facility] from 9/14 to 9/16 for [name] and seizure from hyperglycemia.   Nephrology consulted for acute renal failure on chronic kidney disease stage IV. Patient follows with [facility], Dr. Bo Merino. She states she has discussed dialysis and renal transplant with him in the past. Not currently taking an ACE-I/ARB.  Glucose levels are not at goal. Hemoglobin A1c of 13.6% on 9/14.     Medications: Outpatient medications: Medications Prior to Admission  Medication Sig Dispense Refill Last Dose  . amLODipine (NORVASC) 5 MG tablet Take 5 mg by mouth daily.   08/14/2018 at 0800  . atorvastatin (LIPITOR) 40 MG tablet Take 0.5 tablets (20 mg total) by mouth daily.  1 08/14/2018 at 0800  . insulin aspart (NOVOLOG) 100 UNIT/ML injection Inject 4 Units into the skin 3 (three) times daily with meals. Please take fingersticks reading more than 200 10 mL 0 08/14/2018 at 0800  . insulin glargine (LANTUS) 100 UNIT/ML injection Inject 0.15 mLs (15 Units total) into the skin daily. 10 mL 11 08/13/2018 at 2000  . tamoxifen (NOLVADEX) 20 MG tablet Take 20 mg by mouth daily.   08/14/2018 at 0800  . Vitamin D, Ergocalciferol, (DRISDOL) 50000 units CAPS capsule Take 50,000 Units by mouth every 7 (seven) days.  1 Past Week at Unknown time  . cephALEXin (KEFLEX) 250 MG capsule Take 1 capsule (250 mg total) by mouth 2  (two) times daily for 3 days. 40 capsule 0   . lacosamide 100 MG TABS Take 1 tablet (100 mg total) by mouth 2 (two) times daily. 60 tablet 0     Current medications: Current Facility-Administered Medications  Medication Dose Route Frequency Provider Last Rate Last Dose  . acetaminophen (TYLENOL) tablet 650 mg  650 mg Oral Q6H PRN Demetrios Loll, MD       Or  . acetaminophen (TYLENOL) suppository 650 mg  650 mg Rectal Q6H PRN Demetrios Loll, MD      . amLODipine (NORVASC) tablet 5 mg  5 mg Oral Daily Demetrios Loll, MD   5 mg at 08/16/18 1054  . atorvastatin (LIPITOR) tablet 20 mg  20 mg Oral Daily Bettey Costa, MD   20 mg at 08/16/18 1054  . bisacodyl (DULCOLAX) EC tablet 5 mg  5 mg Oral Daily PRN Demetrios Loll, MD      . calcium acetate (PHOSLO) capsule 667 mg  667 mg Oral TID WC Jadarion Halbig, MD      . heparin injection 5,000 Units  5,000 Units Subcutaneous Q8H Demetrios Loll, MD   5,000 Units at 08/16/18 0504  . hydrALAZINE (APRESOLINE) injection 10 mg  10 mg Intravenous Q6H PRN Darel Hong D, NP      . HYDROcodone-acetaminophen (NORCO/VICODIN) 5-325 MG per tablet 1-2 tablet  1-2 tablet Oral Q4H PRN Demetrios Loll, MD      . insulin aspart (novoLOG) injection 0-5 Units  0-5 Units Subcutaneous QHS Awilda Bill, NP      . insulin aspart (novoLOG) injection 0-9 Units  0-9 Units Subcutaneous TID WC Awilda Bill, NP   2 Units at 08/16/18 [phone]  . insulin aspart (novoLOG) injection 4 Units  4 Units Subcutaneous TID WC Bettey Costa, MD   4 Units at 08/16/18 0843  . insulin glargine (LANTUS) injection 15 Units  15 Units Subcutaneous Daily Bettey Costa, MD   15 Units at 08/16/18 1054  . lacosamide (VIMPAT) tablet 100 mg  100 mg Oral BID Demetrios Loll, MD   100 mg at 08/16/18 1054  . ramelteon (ROZEREM) tablet 8 mg  8 mg Oral Corwin Levins, MD   8 mg at 08/15/18 2215  . senna-docusate (Senokot-S) tablet 1 tablet  1 tablet Oral QHS PRN Demetrios Loll, MD      . sodium bicarbonate tablet 650 mg  650 mg Oral BID  Derak Schurman, MD   650 mg at 08/16/18 1054  . tamoxifen (NOLVADEX) tablet 20 mg  20 mg Oral Daily Bettey Costa, MD   20 mg at 08/16/18 1054      Allergies: Allergies  Allergen Reactions  . Penicillins Other (See Comments)    Pt reports hallucinations when taking penicillins. >Has patient had a PCN reaction causing immediate rash, facial/tongue/throat swelling, SOB or lightheadedness with hypotension: No Has patient had a PCN reaction causing severe rash involving mucus membranes or skin necrosis: No Has patient had a PCN reaction that required hospitalization No Has patient had a PCN reaction occurring within the last 10 years: No If all of the above answers are "NO", then may proceed with Cephalosporin use.      Past Medical History: Past Medical History:  Diagnosis Date  . Breast cancer ([place])   . 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 Diabetes mellitus ([place])   . Diabetic nephropathy ([place])   . Hyperlipidemia   . Hypertension   . Nephrotic syndrome    diabetic nephropathy, biopsy on 01/06/16 at [facility]  . Normocytic anemia   . Seizures ([place])   . Subclinical hypothyroidism   . Tobacco use   . Vitamin D deficiency      Past Surgical History: Past Surgical History:  Procedure Laterality Date  . BREAST SURGERY    . PARTIAL HYSTERECTOMY       Family History: Family History  Problem Relation Age of Onset  . Diabetes Unknown      Social History: Social History   Socioeconomic History  . Marital status: Legally Separated    Spouse name: Not on file  . Number of children: Not on file  . Years of education: Not on file  . Highest education level: Not on file  Occupational History  . Not on file  Social Needs  . Financial resource strain: Not on file  . Food insecurity:    Worry: Not on file    Inability: Not on file  . Transportation needs:    Medical: Not on file    Non-medical: Not on file  Tobacco Use  . Smoking status: Current  Every Day Smoker    Packs/day: 0.50    Types: Cigarettes  . Smokeless  tobacco: Never Used  Substance and Sexual Activity  . Alcohol use: No  . Drug use: No  . Sexual activity: Not on file  Lifestyle  . Physical activity:    Days per week: Not on file    Minutes per session: Not on file  . Stress: Not on file  Relationships  . Social connections:    Talks on phone: Not on file    Gets together: Not on file    Attends religious service: Not on file    Active member of club or organization: Not on file    Attends meetings of clubs or organizations: Not on file    Relationship status: Not on file  . Intimate partner violence:    Fear of current or ex partner: Not on file    Emotionally abused: Not on file    Physically abused: Not on file    Forced sexual activity: Not on file  Other Topics Concern  . Not on file  Social History Narrative  . Not on file     Review of Systems: Review of Systems  Constitutional: Positive for malaise/fatigue and weight loss. Negative for chills, diaphoresis and fever.  HENT: Negative.  Negative for congestion, ear discharge, ear pain, hearing loss, nosebleeds, sinus pain, sore throat and tinnitus.   Eyes: Negative.  Negative for blurred vision, double vision, photophobia, pain, discharge and redness.  Respiratory: Negative.  Negative for cough, hemoptysis, sputum production, shortness of breath, wheezing and stridor.   Cardiovascular: Negative.  Negative for chest pain, palpitations, orthopnea, claudication, leg swelling and PND.  Gastrointestinal: Negative.  Negative for abdominal pain, blood in stool, constipation, diarrhea, heartburn, melena, nausea and vomiting.  Genitourinary: Negative.  Negative for dysuria, flank pain, frequency, hematuria and urgency.  Musculoskeletal: Negative.  Negative for back pain, falls, joint pain, myalgias and neck pain.  Skin: Negative.  Negative for itching and rash.  Neurological: Negative.  Negative for  dizziness, tingling, tremors, sensory change, speech change, focal weakness, seizures, loss of consciousness, weakness and headaches.  Endo/Heme/Allergies: Negative.  Negative for environmental allergies and polydipsia. Does not bruise/bleed easily.  Psychiatric/Behavioral: Negative.  Negative for depression, hallucinations, memory loss, substance abuse and suicidal ideas. The patient is not nervous/anxious and does not have insomnia.     Vital Signs: Blood pressure 121/63, pulse 87, temperature 99 F (37.2 C), temperature source Oral, resp. rate 18, height 5\' 5"  (1.651 m), weight 70.7 kg, SpO2 99 %.  Weight trends: Filed Weights   08/14/18 1802  Weight: 70.7 kg    Physical Exam: General: NAD, sitting up in bed  Head: Normocephalic, atraumatic. Moist oral mucosal membranes  Eyes: Anicteric, PERRL  Neck: Supple, trachea midline  Lungs:  Clear to auscultation  Heart: Regular rate and rhythm  Abdomen:  Soft, nontender,   Extremities:  no peripheral edema.  Neurologic: Nonfocal, moving all four extremities  Skin: No lesions  Access: none     Lab results: Basic Metabolic Panel: Recent Labs  Lab 08/11/18 2215 08/12/18 0205 08/12/18 0630  08/13/18 0220  08/15/18 0535 08/15/18 1205 08/16/18 1007  NA 137 134* 135   < > 130*   < > 136 135 132*  K 3.0* 2.5* 3.5   < > 5.0   < > 4.3 4.9 4.3  CL 103 102 102   < > 103   < > 110 108 108  CO2 24 19* 22   < > 17*   < > 16* 14* 14*  GLUCOSE 93  222* 104*   < > 430*   < > 158* 224* 238*  BUN 47* 47* 44*   < > 41*   < > 53* 52* 53*  CREATININE 3.83* 3.83* 3.60*   < > 3.64*   < > 3.88* 3.87* 4.49*  CALCIUM 5.2* 5.4* 5.9*   < > 6.0*   < > 6.3* 6.3* 6.2*  MG 1.5*  --  2.7*  --  2.5*  --   --   --   --   PHOS  --  4.1  --   --  3.5  --   --   --   --    < > = values in this interval not displayed.    Liver Function Tests: Recent Labs  Lab 08/12/18 1054  ALBUMIN 1.8*   No results for input(s): LIPASE, AMYLASE in the last 168  hours. No results for input(s): AMMONIA in the last 168 hours.  CBC: Recent Labs  Lab 08/11/18 1217 08/12/18 0205 08/14/18 1813 08/15/18 0535 08/16/18 1007  WBC 7.9 8.2 9.2 13.6* 9.2  HGB 9.3* 8.4* 8.3* 7.4* 7.3*  HCT 28.6* 24.9* 28.3* 21.9* 21.8*  MCV 91.3 87.3 103.9* 89.3 90.2  PLT 243 221 264 250 241    Cardiac Enzymes: Recent Labs  Lab 08/14/18 1829  TROPONINI <0.03    BNP: Invalid input(s): POCBNP  CBG: Recent Labs  Lab 08/15/18 1230 08/15/18 1654 08/15/18 2116 08/16/18 0741 08/16/18 1149  GLUCAP 248* 203* 125* 179* 73*    Microbiology: Results for orders placed or performed during the hospital encounter of 08/14/18  MRSA PCR Screening     Status: None   Collection Time: 08/14/18  8:21 PM  Result Value Ref Range Status   MRSA by PCR NEGATIVE NEGATIVE Final    Comment:        The GeneXpert MRSA Assay (FDA approved for NASAL specimens only), is one component of a comprehensive MRSA colonization surveillance program. It is not intended to diagnose MRSA infection nor to guide or monitor treatment for MRSA infections. Performed at [facility], [address] [place], [place] [address]     Coagulation Studies: No results for input(s): LABPROT, INR in the last 72 hours.  Urinalysis: No results for input(s): COLORURINE, LABSPEC, PHURINE, GLUCOSEU, HGBUR, BILIRUBINUR, KETONESUR, PROTEINUR, UROBILINOGEN, NITRITE, LEUKOCYTESUR in the last 72 hours.  Invalid input(s): APPERANCEUR    Imaging: Dg Chest Port 1 View  Result Date: 08/14/2018 CLINICAL DATA:  Shortness of breath since yesterday. Hyperventilation. History of breast cancer. EXAM: PORTABLE CHEST 1 VIEW COMPARISON:  Chest radiograph July 09, 2018 FINDINGS: Cardiac silhouette is mildly enlarged. Pulmonary vascular congestion without pleural effusion or focal consolidation. No pneumothorax. Single-lumen LEFT chest Port-A-Cath in situ. Surgical clips RIGHT breast. On IMPRESSION: Mild  cardiomegaly and pulmonary vascular congestion. Electronically Signed   By: Elon Alas M.D.   On: 08/14/2018 18:59      Assessment & Plan: Ms. Imani Fiebelkorn admitted to [facility] on 08/14/2018 for Diabetic ketoacidosis without coma associated with other specified diabetes mellitus ([place]) [E13.10]   1. Acute renal failure on chronic kidney disease stage IV: baseline creatinine of 3.7 on 07/25/18 GFR of 16.   2. Proteinuria: nephrotic range with greater than 5 grams of proteinuria  3. Diabetes mellitus type II with chronic kidney disease: insulin dependent with poor control, hemoglobin A1c of 13.6%  4. Hypertension: blood pressure at goal.   5. Hyponatremia  6. Metabolic acidosis  7. Hypocalcemia  8. Anemia  with chronic kidney disease  Plan Patient has no acute indication for dialysis however she will need to get started soon. Patient will need to follow up with her outpatient nephrologist or establish care with our practice.  Patient will need dialysis education, especially about home modalities.  - Start sodium bicarbonate - Start calcium acetate.    LOS: 2 Ziair Penson 9/19/201912:05 PM

## 2018-08-16 NOTE — Evaluation (Signed)
Physical Therapy Evaluation Patient Details Name: Alyssa Floyd MRN: [id number] DOB: October 26, 1966 Today's Date: 08/16/2018   History of Present Illness  Pt is a 52 y.o. female with a known history including breast CA, DM, diabetic nephropathy, HLD, HTN, nephrotic syndrome, normocytic anemia, seizures, hypothyroidism, and vit D deficiency.  The patient was just discharged to home from our hospital after treatment of [name] in this new onset seizure.  She has stress from family and feels sick.  She has had shortness of breath, nausea and generalized weakness.  She has no appetite and did not eat.  She did not take home Lantus and insulin.  Assessment includes: [name], acute on chronic kidney disease stage IV, acute hypoxic respiratory failure, anemia of chronic disease, HLD, HTN, and hypocalcemia.      Clinical Impression  Pt presents with mild deficits in strength, transfers, gait and activity tolerance.  Pt presented with good effort and control during bed mobility tasks and sit to/from stand transfers.  Pt requested use of a RW vs no AD during amb secondary to weakness and was able to amb a maximum of 125' with good stability and with her HR and SpO2 WNL.  Pt tolerated the below therapeutic exercise with no adverse symptoms and with HR and SpO2 WNL.  Pt will benefit from HHPT services upon discharge to safely address above deficits for decreased risk of further functional decline and eventual return to PLOF.      Follow Up Recommendations Home health PT    Equipment Recommendations  Rolling walker with 5" wheels    Recommendations for Other Services       Precautions / Restrictions Precautions Precautions: None Restrictions Weight Bearing Restrictions: No      Mobility  Bed Mobility Overal bed mobility: Independent             General bed mobility comments: Good speed and effort with bed mobility tasks  Transfers Overall transfer level: Needs assistance Equipment used: Rolling  walker (2 wheeled) Transfers: Sit to/from Stand Sit to Stand: Supervision         General transfer comment: Good stability and control during transfers  Ambulation/Gait Ambulation/Gait assistance: Supervision Gait Distance (Feet): 125 Feet Assistive device: Rolling walker (2 wheeled) Gait Pattern/deviations: Step-through pattern;Decreased step length - right;Decreased step length - left Gait velocity: Decreased   General Gait Details: Slow cadence with gait with a RW but steady without LOB  Stairs            Wheelchair Mobility    Modified Rankin (Stroke Patients Only)       Balance Overall balance assessment: No apparent balance deficits (not formally assessed)                                           Pertinent Vitals/Pain Pain Assessment: No/denies pain    Home Living Family/patient expects to be discharged to:: Private residence Living Arrangements: Alone Available Help at Discharge: Family;Available PRN/intermittently Type of Home: Apartment Home Access: Level entry     Home Layout: One level Home Equipment: None      Prior Function Level of Independence: Independent         Comments: 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[place], [place] amb community distances without AD, no fall history, Ind with ADLs     Hand Dominance        Extremity/Trunk  Assessment   Upper Extremity Assessment Upper Extremity Assessment: Overall WFL for tasks assessed    Lower Extremity Assessment Lower Extremity Assessment: Generalized weakness       Communication   Communication: No difficulties  Cognition Arousal/Alertness: Awake/alert Behavior During Therapy: WFL for tasks assessed/performed Overall Cognitive Status: Within Functional Limits for tasks assessed                                        General Comments      Exercises Total Joint Exercises Ankle Circles/Pumps: Strengthening;Both;AROM;10 reps Quad  Sets: Strengthening;Both;10 reps Gluteal Sets: Strengthening;Both;10 reps Hip ABduction/ADduction: AROM;Both;5 reps Long Arc Quad: AROM;Both;10 reps Knee Flexion: AROM;Both;10 reps Marching in Standing: AROM;Both;10 reps;Seated;Standing   Assessment/Plan    PT Assessment Patient needs continued PT services  PT Problem List Decreased strength;Decreased activity tolerance;Decreased knowledge of use of DME       PT Treatment Interventions DME instruction;Gait training;Functional mobility training;Balance training;Therapeutic exercise;Therapeutic activities;Patient/family education    PT Goals (Current goals can be found in the Care Plan section)  Acute Rehab PT Goals Patient Stated Goal: Improved strength PT Goal Formulation: With patient Time For Goal Achievement: 08/29/18 Potential to Achieve Goals: Good    Frequency Min 2X/week   Barriers to discharge        Co-evaluation               AM-PAC PT "6 Clicks" Daily Activity  Outcome Measure Difficulty turning over in bed (including adjusting bedclothes, sheets and blankets)?: None Difficulty moving from lying on back to sitting on the side of the bed? : None Difficulty sitting down on and standing up from a chair with arms (e.g., wheelchair, bedside commode, etc,.)?: None Help needed moving to and from a bed to chair (including a wheelchair)?: None Help needed walking in hospital room?: A Little Help needed climbing 3-5 steps with a railing? : A Little 6 Click Score: 22    End of Session Equipment Utilized During Treatment: Gait belt Activity Tolerance: Patient tolerated treatment well Patient left: in chair;with call bell/phone within reach Nurse Communication: Mobility status PT Visit Diagnosis: Muscle weakness (generalized) (M62.81);Difficulty in walking, not elsewhere classified (R26.2)    Time: 2993-7169 PT Time Calculation (min) (ACUTE ONLY): 27 min   Charges:   PT Evaluation $PT Eval Low Complexity: 1  Low PT Treatments $Therapeutic Exercise: 8-22 mins        D. Scott Norene Oliveri PT, DPT 08/16/18, 3:02 PM

## 2018-08-16 NOTE — Progress Notes (Signed)
   08/16/18 1015  Clinical Encounter Type  Visited With Patient  Visit Type Initial;Spiritual support  Referral From Nurse  Consult/Referral To Chaplain  Spiritual Encounters  Spiritual Needs Brochure;Emotional   Alyssa Floyd received an OR to educate the patient on the AD process. Alyssa Floyd appeared to be alert and receptive of the AD. I left the paperwork with her to be completed if she desires.

## 2018-09-07 ENCOUNTER — Emergency Department

## 2018-09-07 ENCOUNTER — Emergency Department
Admission: EM | Admit: 2018-09-07 | Discharge: 2018-09-07 | Disposition: A | Discharge status: Home / Self Care | Attending: Emergency Medicine | Admitting: Emergency Medicine

## 2018-09-07 ENCOUNTER — Encounter: Payer: Self-pay | Admitting: Emergency Medicine

## 2018-09-07 ENCOUNTER — Other Ambulatory Visit: Payer: Self-pay

## 2018-09-07 DIAGNOSIS — F17210 Nicotine dependence, cigarettes, uncomplicated: Secondary | ICD-10-CM | POA: Diagnosis not present

## 2018-09-07 DIAGNOSIS — E11649 Type 2 diabetes mellitus with hypoglycemia without coma: Secondary | ICD-10-CM | POA: Insufficient documentation

## 2018-09-07 DIAGNOSIS — R4182 Altered mental status, unspecified: Secondary | ICD-10-CM | POA: Insufficient documentation

## 2018-09-07 DIAGNOSIS — E162 Hypoglycemia, unspecified: Secondary | ICD-10-CM

## 2018-09-07 DIAGNOSIS — I10 Essential (primary) hypertension: Secondary | ICD-10-CM | POA: Diagnosis not present

## 2018-09-07 LAB — COMPREHENSIVE METABOLIC PANEL
ALBUMIN: 2 g/dL — AB (ref 3.5–5.0)
ALT: 28 U/L (ref 0–44)
ANION GAP: 16 — AB (ref 5–15)
AST: 21 U/L (ref 15–41)
Alkaline Phosphatase: 151 U/L — ABNORMAL HIGH (ref 38–126)
BILIRUBIN TOTAL: 0.6 mg/dL (ref 0.3–1.2)
BUN: 48 mg/dL — ABNORMAL HIGH (ref 6–20)
CALCIUM: 5.4 mg/dL — AB (ref 8.9–10.3)
CO2: 23 mmol/L (ref 22–32)
CREATININE: 4.28 mg/dL — AB (ref 0.44–1.00)
Chloride: 100 mmol/L (ref 98–111)
GFR calc non Af Amer: 11 mL/min — ABNORMAL LOW (ref >60)
GFR, EST AFRICAN AMERICAN: 13 mL/min — AB (ref >60)
GLUCOSE: 35 mg/dL — AB (ref 70–99)
Potassium: 3 mmol/L — ABNORMAL LOW (ref 3.5–5.1)
Sodium: 139 mmol/L (ref 135–145)
TOTAL PROTEIN: 6 g/dL — AB (ref 6.5–8.1)

## 2018-09-07 LAB — CBC WITH DIFFERENTIAL/PLATELET
ABS IMMATURE GRANULOCYTES: 0.03 10*3/uL (ref 0.00–0.07)
Basophils Absolute: 0.1 10*3/uL (ref 0.0–0.1)
Basophils Relative: 1 %
EOS PCT: 15 %
Eosinophils Absolute: 1.1 10*3/uL — ABNORMAL HIGH (ref 0.0–0.5)
HCT: 23.6 % — ABNORMAL LOW (ref 36.0–46.0)
HEMOGLOBIN: 7.7 g/dL — AB (ref 12.0–15.0)
Immature Granulocytes: 0 %
LYMPHS ABS: 0.8 10*3/uL (ref 0.7–4.0)
LYMPHS PCT: 11 %
MCH: 28.4 pg (ref 26.0–34.0)
MCHC: 32.6 g/dL (ref 30.0–36.0)
MCV: 87.1 fL (ref 80.0–100.0)
MONO ABS: 0.6 10*3/uL (ref 0.1–1.0)
Monocytes Relative: 9 %
Neutro Abs: 4.4 10*3/uL (ref 1.7–7.7)
Neutrophils Relative %: 64 %
Platelets: 252 10*3/uL (ref 150–400)
RBC: 2.71 MIL/uL — ABNORMAL LOW (ref 3.87–5.11)
RDW: 14 % (ref 11.5–15.5)
WBC: 7 10*3/uL (ref 4.0–10.5)
nRBC: 0 % (ref 0.0–0.2)

## 2018-09-07 LAB — BLOOD GAS, VENOUS
ACID-BASE EXCESS: 2.9 mmol/L — AB (ref 0.0–2.0)
BICARBONATE: 27.9 mmol/L (ref 20.0–28.0)
O2 SAT: 86.7 %
PCO2 VEN: 44 mmHg (ref 44.0–60.0)
PH VEN: 7.41 (ref 7.250–7.430)
Patient temperature: 37
pO2, Ven: 52 mmHg — ABNORMAL HIGH (ref 32.0–45.0)

## 2018-09-07 LAB — URINALYSIS, COMPLETE (UACMP) WITH MICROSCOPIC
Bilirubin Urine: NEGATIVE
Glucose, UA: 150 mg/dL — AB
Ketones, ur: NEGATIVE mg/dL
NITRITE: NEGATIVE
PROTEIN: 100 mg/dL — AB
SPECIFIC GRAVITY, URINE: 1.01 (ref 1.005–1.030)
pH: 6 (ref 5.0–8.0)

## 2018-09-07 LAB — GLUCOSE, CAPILLARY
Glucose-Capillary: 134 mg/dL — ABNORMAL HIGH (ref 70–99)
Glucose-Capillary: 152 mg/dL — ABNORMAL HIGH (ref 70–99)

## 2018-09-07 LAB — URINE DRUG SCREEN, QUALITATIVE (ARMC ONLY)
AMPHETAMINES, UR SCREEN: NOT DETECTED
Barbiturates, Ur Screen: NOT DETECTED
Benzodiazepine, Ur Scrn: NOT DETECTED
COCAINE METABOLITE, UR ~~LOC~~: NOT DETECTED
Cannabinoid 50 Ng, Ur ~~LOC~~: NOT DETECTED
MDMA (ECSTASY) UR SCREEN: NOT DETECTED
METHADONE SCREEN, URINE: NOT DETECTED
Opiate, Ur Screen: NOT DETECTED
Phencyclidine (PCP) Ur S: NOT DETECTED
TRICYCLIC, UR SCREEN: NOT DETECTED

## 2018-09-07 LAB — ETHANOL

## 2018-09-07 LAB — TROPONIN I: Troponin I: 0.03 ng/mL (ref <0.03)

## 2018-09-07 MED ORDER — DEXTROSE 50 % IV SOLN
25.0000 g | Freq: Once | INTRAVENOUS | Status: AC
  Administered 2018-09-07: 25 g via INTRAVENOUS

## 2018-09-07 MED ORDER — SODIUM CHLORIDE 0.9 % IV SOLN
1.0000 g | Freq: Once | INTRAVENOUS | Status: AC
  Administered 2018-09-07: 1 g via INTRAVENOUS
  Filled 2018-09-07: qty 10

## 2018-09-07 MED ORDER — CALCIUM ACETATE 667 MG PO CAPS: 667.0000 mg | ORAL_CAPSULE | Freq: Three times a day (TID) | ORAL | 1 refills | Status: DC

## 2018-09-07 MED ORDER — DEXTROSE 50 % IV SOLN
INTRAVENOUS | Status: AC
  Filled 2018-09-07: qty 50

## 2018-09-07 MED ORDER — SODIUM CHLORIDE 0.9 % IV SOLN
Freq: Once | INTRAVENOUS | Status: AC
  Administered 2018-09-07: 11:00:00 via INTRAVENOUS

## 2018-09-07 NOTE — ED Notes (Signed)
Pharmacy notified to send Calcium Gluconate dose. 

## 2018-09-07 NOTE — ED Notes (Signed)
RN called to room by pts daughter, RN found pts IV to be removed by patient. Fluids noted to already be finished. This RN placed bandage on PIV site and let primary RN know.

## 2018-09-07 NOTE — ED Notes (Signed)
ED Provider at bedside. 

## 2018-09-07 NOTE — ED Notes (Signed)
Blood sugar 119, per EMS

## 2018-09-07 NOTE — ED Notes (Signed)
Patient is now easily arousable to voice.  Will continue to monitor.

## 2018-09-07 NOTE — ED Provider Notes (Signed)
[facility] Emergency Department Provider Note       Time seen: ----------------------------------------- 10:39 AM on 09/07/2018 -----------------------------------------   I have reviewed the triage vital signs and the nursing notes.  HISTORY   Chief Complaint Altered Mental Status Level V caveat: History/ROS limited by altered mental status   HPI Alyssa Floyd is a 52 y.o. female with a history of breast cancer, diabetes, hyperlipidemia, hypertension, seizures who presents to the ED for altered mental status.  Patient arrives from home with altered mental status, EMS was initially called for possible hypoglycemia.  Patient arrives lethargic and cannot give further review of systems or report.  Past Medical History:  Diagnosis Date  . Breast cancer ([place])   . 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 Diabetes mellitus ([place])   . Diabetic nephropathy ([place])   . Hyperlipidemia   . Hypertension   . Nephrotic syndrome    diabetic nephropathy, biopsy on 01/06/16 at [facility]  . Normocytic anemia   . Seizures ([place])   . Subclinical hypothyroidism   . Tobacco use   . Vitamin D deficiency     Patient Active Problem List   Diagnosis Date Noted  . [name] (diabetic ketoacidoses) ([place]) 07/08/2018  . Dehydration   . Dysarthria   . HHNC (hyperglycemic hyperosmolar nonketotic coma) ([place])   . Acute encephalopathy   . Acute renal failure (ARF) ([place]) 01/29/2016  . [name], type 1 ([place]) 01/29/2016  . Prolonged Q-T interval on ECG 01/29/2016  . Hyperlipidemia 01/29/2016  . Hypertension 01/29/2016  . Chronic diarrhea 01/29/2016  . Nephrotic syndrome 01/29/2016  . Tobacco use 01/29/2016  . 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 Normocytic anemia 01/29/2016  . Subclinical hypothyroidism 01/29/2016  . Encephalopathy acute     Past Surgical History:  Procedure Laterality Date  . BREAST SURGERY    . PARTIAL HYSTERECTOMY       Allergies Penicillins  Social History Social History   Tobacco Use  . Smoking status: Current Every Day Smoker    Packs/day: 0.50    Types: Cigarettes  . Smokeless tobacco: Never Used  Substance Use Topics  . Alcohol use: No  . Drug use: No   Review of Systems Unknown, patient arrives with altered mental status.  All systems negative/normal/unremarkable except as stated in the HPI  ____________________________________________   PHYSICAL EXAM:  VITAL SIGNS: ED Triage Vitals  Enc Vitals Group     BP 09/07/18 1032 140/80     Pulse Rate 09/07/18 1032 89     Resp 09/07/18 1032 16     Temp --      Temp src --      SpO2 09/07/18 1032 97 %     Weight 09/07/18 1033 155 lb 13.8 oz (70.7 kg)     Height --      Head Circumference --      Peak Flow --      Pain Score 09/07/18 1033 0     Pain Loc --      Pain Edu? --      Excl. in [place]? --    Constitutional: Drowsy but responds to verbal stimuli.  No obvious distress Eyes: Conjunctivae are normal. Normal extraocular movements. ENT   Head: Normocephalic and atraumatic.   Nose: No congestion/rhinnorhea.   Mouth/Throat: Mucous membranes are moist.  No tongue laceration   Neck: No stridor. Cardiovascular: Normal rate, regular rhythm. No murmurs, rubs, or gallops. Respiratory: Normal respiratory effort without tachypnea nor  retractions. Breath sounds are clear and equal bilaterally. No wheezes/rales/rhonchi. Gastrointestinal: Soft and nontender. Normal bowel sounds Musculoskeletal: Nontender with normal range of motion in extremities. No lower extremity tenderness nor edema. Neurologic: Patient is drowsy but responds to verbal stimuli, follows commands. Skin:  Skin is warm, dry and intact. No rash noted. Psychiatric: Drowsy but normal mood ____________________________________________  EKG: Interpreted by me.  Sinus rhythm rate 91 bpm, borderline long QT, low voltage, normal  axis  ____________________________________________  ED COURSE:  As part of my medical decision making, I reviewed the following data within the [place] History obtained from family if available, nursing notes, old chart and ekg, as well as notes from prior ED visits. Patient presented for altered mental status, we will assess with labs and imaging as indicated at this time. Clinical Course as of Sep 08 1339  Fri Sep 07, 2018  1152 Corrected calcium is 7.0   [JW]  1304 I discussed with nephrology, most of her labs are at her baseline.  We have given her calcium and dextrose and her blood sugar appears to have stabilized.  We will start her on calcium acetate daily.   [JW]    Clinical Course User Index [JW] Earleen Newport, MD   Procedures ____________________________________________   LABS (pertinent positives/negatives)  Labs Reviewed  CBC WITH DIFFERENTIAL/PLATELET - Abnormal; Notable for the following components:      Result Value   RBC 2.71 (*)    Hemoglobin 7.7 (*)    HCT 23.6 (*)    Eosinophils Absolute 1.1 (*)    All other components within normal limits  COMPREHENSIVE METABOLIC PANEL - Abnormal; Notable for the following components:   Potassium 3.0 (*)    Glucose, Bld 35 (*)    BUN 48 (*)    Creatinine, Ser 4.28 (*)    Calcium 5.4 (*)    Total Protein 6.0 (*)    Albumin 2.0 (*)    Alkaline Phosphatase 151 (*)    GFR calc non Af Amer 11 (*)    GFR calc Af Amer 13 (*)    Anion gap 16 (*)    All other components within normal limits  TROPONIN I - Abnormal; Notable for the following components:   Troponin I 0.03 (*)    All other components within normal limits  URINALYSIS, COMPLETE (UACMP) WITH MICROSCOPIC - Abnormal; Notable for the following components:   Color, Urine STRAW (*)    APPearance CLEAR (*)    Glucose, UA 150 (*)    Hgb urine dipstick SMALL (*)    Protein, ur 100 (*)    Leukocytes, UA TRACE (*)    Bacteria, UA RARE (*)     All other components within normal limits  BLOOD GAS, VENOUS - Abnormal; Notable for the following components:   pO2, Ven 52.0 (*)    Acid-Base Excess 2.9 (*)    All other components within normal limits  GLUCOSE, CAPILLARY - Abnormal; Notable for the following components:   Glucose-Capillary 134 (*)    All other components within normal limits  GLUCOSE, CAPILLARY - Abnormal; Notable for the following components:   Glucose-Capillary 152 (*)    All other components within normal limits  URINE CULTURE  URINE DRUG SCREEN, QUALITATIVE ([facility] ONLY)  ETHANOL  CBG MONITORING, ED  CBG MONITORING, ED    RADIOLOGY Images were viewed by me  CT head Is unremarkable ____________________________________________  DIFFERENTIAL DIAGNOSIS   Seizure, CVA, occult infection, [name], overdose, intoxication,  occult infection  FINAL ASSESSMENT AND PLAN  Altered mental status, hypoglycemia, possible urinary tract infection   Plan: The patient had presented for altered mental status. Patient's labs did reveal chronic anemia and chronic known kidney disease.  She was profoundly hypoglycemic and was given an amp of D50 with return of her normal mental status.  Also ordered 1 g of calcium gluconate because her corrected calcium was 7.0.  Overall she does appear somewhat edematous but her labs are otherwise not significantly changed from prior.  Upon discussion with nephrology she will be placed on calcium acetate and I have advised if she feels more edematous or she is putting on fluid to take an extra dose of her diuretic.  She is stable for outpatient follow-up and her blood sugars have stabilized.   Laurence Aly, MD   Note: This note was generated in part or whole with voice recognition software. Voice recognition is usually quite accurate but there are transcription errors that can and very often do occur. I apologize for any typographical errors that were not detected and corrected.      Earleen Newport, MD 09/07/18 1341

## 2018-09-07 NOTE — ED Triage Notes (Signed)
Arrives via [facility] from home.  EMS called for possible hypoglycemia. CBG:  119.  EMS reports that patient was able to respond and answer questions after tactile stimulation.    Patient arrives to ED.  Wakes with verbal stimlation.  Speech clear.  Awake and alert.  Oriented to person.

## 2018-09-09 LAB — URINE CULTURE: SPECIAL REQUESTS: NORMAL

## 2018-10-11 DIAGNOSIS — E876 Hypokalemia: Secondary | ICD-10-CM | POA: Insufficient documentation

## 2018-12-17 ENCOUNTER — Inpatient Hospital Stay
Admission: EM | Admit: 2018-12-17 | Discharge: 2018-12-20 | DRG: 637 | Disposition: A | Discharge status: Home / Self Care | Attending: Internal Medicine | Admitting: Internal Medicine

## 2018-12-17 ENCOUNTER — Other Ambulatory Visit: Payer: Self-pay

## 2018-12-17 DIAGNOSIS — Z992 Dependence on renal dialysis: Secondary | ICD-10-CM | POA: Diagnosis not present

## 2018-12-17 DIAGNOSIS — D631 Anemia in chronic kidney disease: Secondary | ICD-10-CM | POA: Diagnosis not present

## 2018-12-17 DIAGNOSIS — N2581 Secondary hyperparathyroidism of renal origin: Secondary | ICD-10-CM | POA: Diagnosis present

## 2018-12-17 DIAGNOSIS — Z853 Personal history of malignant neoplasm of breast: Secondary | ICD-10-CM | POA: Diagnosis not present

## 2018-12-17 DIAGNOSIS — E785 Hyperlipidemia, unspecified: Secondary | ICD-10-CM | POA: Diagnosis not present

## 2018-12-17 DIAGNOSIS — F17210 Nicotine dependence, cigarettes, uncomplicated: Secondary | ICD-10-CM | POA: Diagnosis not present

## 2018-12-17 DIAGNOSIS — E8770 Fluid overload, unspecified: Secondary | ICD-10-CM | POA: Diagnosis present

## 2018-12-17 DIAGNOSIS — Z87441 Personal history of nephrotic syndrome: Secondary | ICD-10-CM

## 2018-12-17 DIAGNOSIS — J9690 Respiratory failure, unspecified, unspecified whether with hypoxia or hypercapnia: Secondary | ICD-10-CM

## 2018-12-17 DIAGNOSIS — E1010 Type 1 diabetes mellitus with ketoacidosis without coma: Principal | ICD-10-CM | POA: Diagnosis present

## 2018-12-17 DIAGNOSIS — I1311 Hypertensive heart and chronic kidney disease without heart failure, with stage 5 chronic kidney disease, or end stage renal disease: Secondary | ICD-10-CM | POA: Diagnosis not present

## 2018-12-17 DIAGNOSIS — E876 Hypokalemia: Secondary | ICD-10-CM | POA: Diagnosis not present

## 2018-12-17 DIAGNOSIS — Z794 Long term (current) use of insulin: Secondary | ICD-10-CM

## 2018-12-17 DIAGNOSIS — E871 Hypo-osmolality and hyponatremia: Secondary | ICD-10-CM | POA: Diagnosis not present

## 2018-12-17 DIAGNOSIS — E860 Dehydration: Secondary | ICD-10-CM | POA: Diagnosis not present

## 2018-12-17 DIAGNOSIS — E1022 Type 1 diabetes mellitus with diabetic chronic kidney disease: Secondary | ICD-10-CM | POA: Diagnosis not present

## 2018-12-17 DIAGNOSIS — G40909 Epilepsy, unspecified, not intractable, without status epilepticus: Secondary | ICD-10-CM | POA: Diagnosis present

## 2018-12-17 DIAGNOSIS — R739 Hyperglycemia, unspecified: Secondary | ICD-10-CM | POA: Diagnosis present

## 2018-12-17 DIAGNOSIS — R251 Tremor, unspecified: Secondary | ICD-10-CM | POA: Diagnosis present

## 2018-12-17 DIAGNOSIS — J90 Pleural effusion, not elsewhere classified: Secondary | ICD-10-CM

## 2018-12-17 DIAGNOSIS — J189 Pneumonia, unspecified organism: Secondary | ICD-10-CM | POA: Diagnosis not present

## 2018-12-17 DIAGNOSIS — E1100 Type 2 diabetes mellitus with hyperosmolarity without nonketotic hyperglycemic-hyperosmolar coma (NKHHC): Secondary | ICD-10-CM

## 2018-12-17 DIAGNOSIS — Z79810 Long term (current) use of selective estrogen receptor modulators (SERMs): Secondary | ICD-10-CM

## 2018-12-17 DIAGNOSIS — N186 End stage renal disease: Secondary | ICD-10-CM | POA: Diagnosis present

## 2018-12-17 DIAGNOSIS — R531 Weakness: Secondary | ICD-10-CM

## 2018-12-17 DIAGNOSIS — E1165 Type 2 diabetes mellitus with hyperglycemia: Secondary | ICD-10-CM

## 2018-12-17 DIAGNOSIS — Z9889 Other specified postprocedural states: Secondary | ICD-10-CM

## 2018-12-17 DIAGNOSIS — J918 Pleural effusion in other conditions classified elsewhere: Secondary | ICD-10-CM | POA: Diagnosis not present

## 2018-12-17 DIAGNOSIS — Z880 Allergy status to penicillin: Secondary | ICD-10-CM

## 2018-12-17 LAB — CBC WITH DIFFERENTIAL/PLATELET
ABS IMMATURE GRANULOCYTES: 0.03 10*3/uL (ref 0.00–0.07)
BASOS PCT: 0 %
Basophils Absolute: 0 10*3/uL (ref 0.0–0.1)
Eosinophils Absolute: 0 10*3/uL (ref 0.0–0.5)
Eosinophils Relative: 0 %
HCT: 31.7 % — ABNORMAL LOW (ref 36.0–46.0)
Hemoglobin: 9.2 g/dL — ABNORMAL LOW (ref 12.0–15.0)
Immature Granulocytes: 0 %
Lymphocytes Relative: 4 %
Lymphs Abs: 0.3 10*3/uL — ABNORMAL LOW (ref 0.7–4.0)
MCH: 25.9 pg — AB (ref 26.0–34.0)
MCHC: 29 g/dL — ABNORMAL LOW (ref 30.0–36.0)
MCV: 89.3 fL (ref 80.0–100.0)
MONO ABS: 0.8 10*3/uL (ref 0.1–1.0)
Monocytes Relative: 11 %
Neutro Abs: 6.6 10*3/uL (ref 1.7–7.7)
Neutrophils Relative %: 85 %
Platelets: 235 10*3/uL (ref 150–400)
RBC: 3.55 MIL/uL — ABNORMAL LOW (ref 3.87–5.11)
RDW: 15.9 % — ABNORMAL HIGH (ref 11.5–15.5)
WBC: 7.8 10*3/uL (ref 4.0–10.5)
nRBC: 0 % (ref 0.0–0.2)

## 2018-12-17 LAB — GLUCOSE, CAPILLARY: Glucose-Capillary: >600 mg/dL (ref 70–99)

## 2018-12-17 NOTE — ED Notes (Signed)
Pt stated that she knew when she had to urinated and who use call light to inform staff.

## 2018-12-17 NOTE — ED Triage Notes (Signed)
Pt is brought in via EMS. EMS stated that her family stated for the past few days pt has been feeling weak and a little confused. Pt stated that she is here because her legs keep twitching and she does not know why. Pt denies any pain. Pt is A&Ox3 at this time. No family present at this time.

## 2018-12-17 NOTE — ED Notes (Signed)
Family called out and stated that thought pt was having a seizure. Upon entering the room, pt is laying still on the bed and is answering this nurses questions when asked. Provider Dr. Karma Greaser made aware.

## 2018-12-18 ENCOUNTER — Emergency Department

## 2018-12-18 ENCOUNTER — Inpatient Hospital Stay

## 2018-12-18 DIAGNOSIS — I1311 Hypertensive heart and chronic kidney disease without heart failure, with stage 5 chronic kidney disease, or end stage renal disease: Secondary | ICD-10-CM | POA: Diagnosis present

## 2018-12-18 DIAGNOSIS — J189 Pneumonia, unspecified organism: Secondary | ICD-10-CM | POA: Diagnosis present

## 2018-12-18 DIAGNOSIS — F17210 Nicotine dependence, cigarettes, uncomplicated: Secondary | ICD-10-CM | POA: Diagnosis present

## 2018-12-18 DIAGNOSIS — Z794 Long term (current) use of insulin: Secondary | ICD-10-CM | POA: Diagnosis not present

## 2018-12-18 DIAGNOSIS — N2581 Secondary hyperparathyroidism of renal origin: Secondary | ICD-10-CM | POA: Diagnosis present

## 2018-12-18 DIAGNOSIS — G40901 Epilepsy, unspecified, not intractable, with status epilepticus: Secondary | ICD-10-CM | POA: Diagnosis not present

## 2018-12-18 DIAGNOSIS — G40909 Epilepsy, unspecified, not intractable, without status epilepticus: Secondary | ICD-10-CM | POA: Diagnosis present

## 2018-12-18 DIAGNOSIS — R251 Tremor, unspecified: Secondary | ICD-10-CM

## 2018-12-18 DIAGNOSIS — E860 Dehydration: Secondary | ICD-10-CM | POA: Diagnosis present

## 2018-12-18 DIAGNOSIS — E1100 Type 2 diabetes mellitus with hyperosmolarity without nonketotic hyperglycemic-hyperosmolar coma (NKHHC): Secondary | ICD-10-CM | POA: Diagnosis not present

## 2018-12-18 DIAGNOSIS — E1110 Type 2 diabetes mellitus with ketoacidosis without coma: Secondary | ICD-10-CM | POA: Diagnosis not present

## 2018-12-18 DIAGNOSIS — R739 Hyperglycemia, unspecified: Secondary | ICD-10-CM | POA: Diagnosis present

## 2018-12-18 DIAGNOSIS — E1010 Type 1 diabetes mellitus with ketoacidosis without coma: Secondary | ICD-10-CM | POA: Diagnosis present

## 2018-12-18 DIAGNOSIS — E785 Hyperlipidemia, unspecified: Secondary | ICD-10-CM | POA: Diagnosis present

## 2018-12-18 DIAGNOSIS — Z79810 Long term (current) use of selective estrogen receptor modulators (SERMs): Secondary | ICD-10-CM | POA: Diagnosis not present

## 2018-12-18 DIAGNOSIS — J90 Pleural effusion, not elsewhere classified: Secondary | ICD-10-CM | POA: Diagnosis not present

## 2018-12-18 DIAGNOSIS — E876 Hypokalemia: Secondary | ICD-10-CM | POA: Diagnosis not present

## 2018-12-18 DIAGNOSIS — E8770 Fluid overload, unspecified: Secondary | ICD-10-CM | POA: Diagnosis present

## 2018-12-18 DIAGNOSIS — J918 Pleural effusion in other conditions classified elsewhere: Secondary | ICD-10-CM | POA: Diagnosis present

## 2018-12-18 DIAGNOSIS — Z880 Allergy status to penicillin: Secondary | ICD-10-CM | POA: Diagnosis not present

## 2018-12-18 DIAGNOSIS — Z853 Personal history of malignant neoplasm of breast: Secondary | ICD-10-CM | POA: Diagnosis not present

## 2018-12-18 DIAGNOSIS — Z87441 Personal history of nephrotic syndrome: Secondary | ICD-10-CM | POA: Diagnosis not present

## 2018-12-18 DIAGNOSIS — E1022 Type 1 diabetes mellitus with diabetic chronic kidney disease: Secondary | ICD-10-CM | POA: Diagnosis present

## 2018-12-18 DIAGNOSIS — Z992 Dependence on renal dialysis: Secondary | ICD-10-CM | POA: Diagnosis not present

## 2018-12-18 DIAGNOSIS — N186 End stage renal disease: Secondary | ICD-10-CM | POA: Diagnosis present

## 2018-12-18 DIAGNOSIS — J9601 Acute respiratory failure with hypoxia: Secondary | ICD-10-CM | POA: Diagnosis not present

## 2018-12-18 DIAGNOSIS — D631 Anemia in chronic kidney disease: Secondary | ICD-10-CM | POA: Diagnosis present

## 2018-12-18 DIAGNOSIS — E871 Hypo-osmolality and hyponatremia: Secondary | ICD-10-CM | POA: Diagnosis present

## 2018-12-18 LAB — CBC
HCT: 35.3 % — ABNORMAL LOW (ref 36.0–46.0)
Hemoglobin: 10.3 g/dL — ABNORMAL LOW (ref 12.0–15.0)
MCH: 25.6 pg — ABNORMAL LOW (ref 26.0–34.0)
MCHC: 29.2 g/dL — ABNORMAL LOW (ref 30.0–36.0)
MCV: 87.8 fL (ref 80.0–100.0)
Platelets: 242 10*3/uL (ref 150–400)
RBC: 4.02 MIL/uL (ref 3.87–5.11)
RDW: 15.9 % — ABNORMAL HIGH (ref 11.5–15.5)
WBC: 8.3 10*3/uL (ref 4.0–10.5)
nRBC: 0 % (ref 0.0–0.2)

## 2018-12-18 LAB — BLOOD GAS, ARTERIAL
Acid-base deficit: 11.6 mmol/L — ABNORMAL HIGH (ref 0.0–2.0)
Acid-base deficit: 11.2 mmol/L — ABNORMAL HIGH (ref 0.0–2.0)
Bicarbonate: 14.5 mmol/L — ABNORMAL LOW (ref 20.0–28.0)
Bicarbonate: 14.8 mmol/L — ABNORMAL LOW (ref 20.0–28.0)
FIO2: 0.28
FIO2: 0.28
O2 Saturation: 93 %
O2 Saturation: 93.7 %
PATIENT TEMPERATURE: 37
Patient temperature: 37
pCO2 arterial: 33 mmHg (ref 32.0–48.0)
pCO2 arterial: 33 mmHg (ref 32.0–48.0)
pH, Arterial: 7.25 — ABNORMAL LOW (ref 7.350–7.450)
pH, Arterial: 7.26 — ABNORMAL LOW (ref 7.350–7.450)
pO2, Arterial: 78 mmHg — ABNORMAL LOW (ref 83.0–108.0)
pO2, Arterial: 80 mmHg — ABNORMAL LOW (ref 83.0–108.0)

## 2018-12-18 LAB — BASIC METABOLIC PANEL
Anion gap: 15 (ref 5–15)
Anion gap: 8 (ref 5–15)
Anion gap: 9 (ref 5–15)
Anion gap: 8 (ref 5–15)
BUN: 40 mg/dL — ABNORMAL HIGH (ref 6–20)
BUN: 17 mg/dL (ref 6–20)
BUN: 17 mg/dL (ref 6–20)
BUN: 17 mg/dL (ref 6–20)
CHLORIDE: 89 mmol/L — AB (ref 98–111)
CO2: 16 mmol/L — ABNORMAL LOW (ref 22–32)
CO2: 26 mmol/L (ref 22–32)
CO2: 26 mmol/L (ref 22–32)
CO2: 26 mmol/L (ref 22–32)
Calcium: 6.7 mg/dL — ABNORMAL LOW (ref 8.9–10.3)
Calcium: 7.3 mg/dL — ABNORMAL LOW (ref 8.9–10.3)
Calcium: 7.2 mg/dL — ABNORMAL LOW (ref 8.9–10.3)
Calcium: 7.3 mg/dL — ABNORMAL LOW (ref 8.9–10.3)
Chloride: 101 mmol/L (ref 98–111)
Chloride: 99 mmol/L (ref 98–111)
Chloride: 100 mmol/L (ref 98–111)
Creatinine, Ser: 4.14 mg/dL — ABNORMAL HIGH (ref 0.44–1.00)
Creatinine, Ser: 2.16 mg/dL — ABNORMAL HIGH (ref 0.44–1.00)
Creatinine, Ser: 2.36 mg/dL — ABNORMAL HIGH (ref 0.44–1.00)
Creatinine, Ser: 2.45 mg/dL — ABNORMAL HIGH (ref 0.44–1.00)
GFR calc Af Amer: 14 mL/min — ABNORMAL LOW (ref >60)
GFR calc Af Amer: 30 mL/min — ABNORMAL LOW (ref >60)
GFR calc Af Amer: 27 mL/min — ABNORMAL LOW (ref >60)
GFR calc Af Amer: 26 mL/min — ABNORMAL LOW (ref >60)
GFR calc non Af Amer: 12 mL/min — ABNORMAL LOW (ref >60)
GFR calc non Af Amer: 22 mL/min — ABNORMAL LOW (ref >60)
GFR, EST NON AFRICAN AMERICAN: 26 mL/min — AB (ref >60)
GFR, EST NON AFRICAN AMERICAN: 23 mL/min — AB (ref >60)
Glucose, Bld: 1170 mg/dL (ref 70–99)
Glucose, Bld: 141 mg/dL — ABNORMAL HIGH (ref 70–99)
Glucose, Bld: 93 mg/dL (ref 70–99)
Glucose, Bld: 118 mg/dL — ABNORMAL HIGH (ref 70–99)
Potassium: 3.7 mmol/L (ref 3.5–5.1)
Potassium: 2.9 mmol/L — ABNORMAL LOW (ref 3.5–5.1)
Potassium: 3.1 mmol/L — ABNORMAL LOW (ref 3.5–5.1)
Potassium: 3.2 mmol/L — ABNORMAL LOW (ref 3.5–5.1)
SODIUM: 135 mmol/L (ref 135–145)
Sodium: 120 mmol/L — ABNORMAL LOW (ref 135–145)
Sodium: 134 mmol/L — ABNORMAL LOW (ref 135–145)
Sodium: 134 mmol/L — ABNORMAL LOW (ref 135–145)

## 2018-12-18 LAB — GLUCOSE, CAPILLARY
GLUCOSE-CAPILLARY: 65 mg/dL — AB (ref 70–99)
Glucose-Capillary: >600 mg/dL (ref 70–99)
Glucose-Capillary: >600 mg/dL (ref 70–99)
Glucose-Capillary: >600 mg/dL (ref 70–99)
Glucose-Capillary: >600 mg/dL (ref 70–99)
Glucose-Capillary: 447 mg/dL — ABNORMAL HIGH (ref 70–99)
Glucose-Capillary: 344 mg/dL — ABNORMAL HIGH (ref 70–99)
Glucose-Capillary: 205 mg/dL — ABNORMAL HIGH (ref 70–99)
Glucose-Capillary: 152 mg/dL — ABNORMAL HIGH (ref 70–99)
Glucose-Capillary: 111 mg/dL — ABNORMAL HIGH (ref 70–99)
Glucose-Capillary: 55 mg/dL — ABNORMAL LOW (ref 70–99)
Glucose-Capillary: 61 mg/dL — ABNORMAL LOW (ref 70–99)
Glucose-Capillary: 63 mg/dL — ABNORMAL LOW (ref 70–99)
Glucose-Capillary: 67 mg/dL — ABNORMAL LOW (ref 70–99)
Glucose-Capillary: 73 mg/dL (ref 70–99)
Glucose-Capillary: 92 mg/dL (ref 70–99)
Glucose-Capillary: 134 mg/dL — ABNORMAL HIGH (ref 70–99)

## 2018-12-18 LAB — BETA-HYDROXYBUTYRIC ACID: Beta-Hydroxybutyric Acid: 3.99 mmol/L — ABNORMAL HIGH (ref 0.05–0.27)

## 2018-12-18 LAB — COMPREHENSIVE METABOLIC PANEL
ALT: 12 U/L (ref 0–44)
ANION GAP: 15 (ref 5–15)
AST: 13 U/L — ABNORMAL LOW (ref 15–41)
Albumin: 2 g/dL — ABNORMAL LOW (ref 3.5–5.0)
Alkaline Phosphatase: 124 U/L (ref 38–126)
BUN: 38 mg/dL — ABNORMAL HIGH (ref 6–20)
CO2: 18 mmol/L — ABNORMAL LOW (ref 22–32)
Calcium: 6.7 mg/dL — ABNORMAL LOW (ref 8.9–10.3)
Chloride: 85 mmol/L — ABNORMAL LOW (ref 98–111)
Creatinine, Ser: 4.05 mg/dL — ABNORMAL HIGH (ref 0.44–1.00)
GFR calc Af Amer: 14 mL/min — ABNORMAL LOW (ref >60)
GFR calc non Af Amer: 12 mL/min — ABNORMAL LOW (ref >60)
Glucose, Bld: 1259 mg/dL (ref 70–99)
POTASSIUM: 3.6 mmol/L (ref 3.5–5.1)
Sodium: 118 mmol/L — CL (ref 135–145)
Total Bilirubin: 1.2 mg/dL (ref 0.3–1.2)
Total Protein: 5.9 g/dL — ABNORMAL LOW (ref 6.5–8.1)

## 2018-12-18 LAB — MAGNESIUM
Magnesium: 1.8 mg/dL (ref 1.7–2.4)
Magnesium: 1.9 mg/dL (ref 1.7–2.4)

## 2018-12-18 LAB — LACTIC ACID, PLASMA
Lactic Acid, Venous: 1.7 mmol/L (ref 0.5–1.9)
Lactic Acid, Venous: 2.8 mmol/L (ref 0.5–1.9)

## 2018-12-18 LAB — OSMOLALITY: Osmolality: 341 mOsm/kg (ref 275–295)

## 2018-12-18 LAB — TROPONIN I

## 2018-12-18 LAB — MRSA PCR SCREENING: MRSA by PCR: NEGATIVE

## 2018-12-18 LAB — PHOSPHORUS: Phosphorus: 5.2 mg/dL — ABNORMAL HIGH (ref 2.5–4.6)

## 2018-12-18 MED ORDER — CHLORHEXIDINE GLUCONATE CLOTH 2 % EX PADS
6.0000 | MEDICATED_PAD | Freq: Every day | CUTANEOUS | Status: DC
  Administered 2018-12-18: 6 via TOPICAL

## 2018-12-18 MED ORDER — LEVETIRACETAM IN NACL 500 MG/100ML IV SOLN
500.0000 mg | Freq: Two times a day (BID) | INTRAVENOUS | Status: DC
  Administered 2018-12-18: 500 mg via INTRAVENOUS
  Filled 2018-12-18 (×4): qty 100

## 2018-12-18 MED ORDER — INSULIN REGULAR(HUMAN) IN NACL 100-0.9 UT/100ML-% IV SOLN
INTRAVENOUS | Status: DC
  Administered 2018-12-18: 21.6 [IU]/h via INTRAVENOUS
  Filled 2018-12-18: qty 100

## 2018-12-18 MED ORDER — DEXTROSE 50 % IV SOLN
INTRAVENOUS | Status: AC
  Administered 2018-12-18: 18 mL via INTRAVENOUS
  Filled 2018-12-18: qty 50

## 2018-12-18 MED ORDER — INSULIN DETEMIR 100 UNIT/ML ~~LOC~~ SOLN
15.0000 [IU] | Freq: Every day | SUBCUTANEOUS | Status: DC
  Administered 2018-12-19: 15 [IU] via SUBCUTANEOUS
  Filled 2018-12-18 (×2): qty 0.15

## 2018-12-18 MED ORDER — INSULIN ASPART 100 UNIT/ML ~~LOC~~ SOLN
1.0000 [IU] | SUBCUTANEOUS | Status: DC
  Administered 2018-12-18: 1 [IU] via SUBCUTANEOUS
  Administered 2018-12-19 (×5): 2 [IU] via SUBCUTANEOUS
  Filled 2018-12-18 (×6): qty 1

## 2018-12-18 MED ORDER — SODIUM CHLORIDE 0.9 % IV BOLUS
1000.0000 mL | Freq: Once | INTRAVENOUS | Status: AC
  Administered 2018-12-18: 1000 mL via INTRAVENOUS

## 2018-12-18 MED ORDER — LIDOCAINE-PRILOCAINE 2.5-2.5 % EX CREA
1.0000 "application " | TOPICAL_CREAM | CUTANEOUS | Status: DC | PRN
  Filled 2018-12-18: qty 5

## 2018-12-18 MED ORDER — INSULIN DETEMIR 100 UNIT/ML ~~LOC~~ SOLN
15.0000 [IU] | Freq: Two times a day (BID) | SUBCUTANEOUS | Status: DC
  Filled 2018-12-18 (×2): qty 0.15

## 2018-12-18 MED ORDER — DEXTROSE-NACL 5-0.45 % IV SOLN
INTRAVENOUS | Status: DC
  Administered 2018-12-18: 16:00:00 via INTRAVENOUS

## 2018-12-18 MED ORDER — CALCIUM ACETATE (PHOS BINDER) 667 MG PO CAPS
667.0000 mg | ORAL_CAPSULE | Freq: Three times a day (TID) | ORAL | Status: DC
  Administered 2018-12-18 – 2018-12-20 (×5): 667 mg via ORAL
  Filled 2018-12-18 (×9): qty 1

## 2018-12-18 MED ORDER — AMLODIPINE BESYLATE 10 MG PO TABS
10.0000 mg | ORAL_TABLET | Freq: Every day | ORAL | Status: DC
  Administered 2018-12-19: 10 mg via ORAL
  Filled 2018-12-18: qty 2

## 2018-12-18 MED ORDER — LEVETIRACETAM IN NACL 1000 MG/100ML IV SOLN
1000.0000 mg | Freq: Every day | INTRAVENOUS | Status: DC
  Administered 2018-12-20: 1000 mg via INTRAVENOUS
  Filled 2018-12-18 (×4): qty 100

## 2018-12-18 MED ORDER — KCL IN DEXTROSE-NACL 20-5-0.45 MEQ/L-%-% IV SOLN
INTRAVENOUS | Status: DC
  Administered 2018-12-18 – 2018-12-19 (×2): via INTRAVENOUS
  Filled 2018-12-18 (×4): qty 1000

## 2018-12-18 MED ORDER — DEXTROSE 50 % IV SOLN
18.0000 mL | Freq: Once | INTRAVENOUS | Status: AC
  Administered 2018-12-18: 18 mL via INTRAVENOUS

## 2018-12-18 MED ORDER — ALTEPLASE 2 MG IJ SOLR
2.0000 mg | Freq: Once | INTRAMUSCULAR | Status: DC | PRN
  Filled 2018-12-18: qty 2

## 2018-12-18 MED ORDER — SODIUM BICARBONATE 650 MG PO TABS
650.0000 mg | ORAL_TABLET | Freq: Two times a day (BID) | ORAL | Status: DC
  Administered 2018-12-18 – 2018-12-20 (×4): 650 mg via ORAL
  Filled 2018-12-18 (×7): qty 1

## 2018-12-18 MED ORDER — POTASSIUM CHLORIDE 10 MEQ/100ML IV SOLN: 10.0000 meq | INTRAVENOUS | Status: DC

## 2018-12-18 MED ORDER — SODIUM CHLORIDE 0.9 % IV SOLN: 100.0000 mL | INTRAVENOUS | Status: DC | PRN

## 2018-12-18 MED ORDER — DEXTROSE-NACL 5-0.45 % IV SOLN
INTRAVENOUS | Status: DC
  Administered 2018-12-18: 11:00:00 via INTRAVENOUS

## 2018-12-18 MED ORDER — HEPARIN SODIUM (PORCINE) 1000 UNIT/ML DIALYSIS
1000.0000 [IU] | INTRAMUSCULAR | Status: DC | PRN
  Filled 2018-12-18: qty 1

## 2018-12-18 MED ORDER — LIDOCAINE HCL (PF) 1 % IJ SOLN
5.0000 mL | INTRAMUSCULAR | Status: DC | PRN
  Filled 2018-12-18: qty 5

## 2018-12-18 MED ORDER — CALCITRIOL 0.25 MCG PO CAPS
0.5000 ug | ORAL_CAPSULE | Freq: Every day | ORAL | Status: DC
  Administered 2018-12-19 – 2018-12-20 (×2): 0.5 ug via ORAL
  Filled 2018-12-18 (×3): qty 2

## 2018-12-18 MED ORDER — LEVETIRACETAM IN NACL 500 MG/100ML IV SOLN
500.0000 mg | Freq: Once | INTRAVENOUS | Status: AC
  Administered 2018-12-18: 500 mg via INTRAVENOUS
  Filled 2018-12-18: qty 100

## 2018-12-18 MED ORDER — LEVETIRACETAM IN NACL 1000 MG/100ML IV SOLN
1000.0000 mg | INTRAVENOUS | Status: AC
  Administered 2018-12-18: 1000 mg via INTRAVENOUS
  Filled 2018-12-18: qty 100

## 2018-12-18 MED ORDER — POTASSIUM CHLORIDE 10 MEQ/100ML IV SOLN
10.0000 meq | INTRAVENOUS | Status: AC
  Administered 2018-12-18 (×2): 10 meq via INTRAVENOUS
  Filled 2018-12-18 (×2): qty 100

## 2018-12-18 MED ORDER — HEPARIN SODIUM (PORCINE) 5000 UNIT/ML IJ SOLN
5000.0000 [IU] | Freq: Three times a day (TID) | INTRAMUSCULAR | Status: DC
  Administered 2018-12-18 – 2018-12-20 (×8): 5000 [IU] via SUBCUTANEOUS
  Filled 2018-12-18 (×7): qty 1

## 2018-12-18 MED ORDER — SODIUM CHLORIDE 0.45 % IV SOLN: INTRAVENOUS | Status: DC

## 2018-12-18 MED ORDER — SODIUM CHLORIDE 0.9 % IV BOLUS: 1000.0000 mL | Freq: Once | INTRAVENOUS | Status: DC

## 2018-12-18 MED ORDER — DEXTROSE 50 % IV SOLN: 25.0000 mL | INTRAVENOUS | Status: DC | PRN

## 2018-12-18 MED ORDER — ATORVASTATIN CALCIUM 20 MG PO TABS
20.0000 mg | ORAL_TABLET | Freq: Every day | ORAL | Status: DC
  Administered 2018-12-19 – 2018-12-20 (×2): 20 mg via ORAL
  Filled 2018-12-18 (×2): qty 1

## 2018-12-18 MED ORDER — PENTAFLUOROPROP-TETRAFLUOROETH EX AERO
1.0000 "application " | INHALATION_SPRAY | CUTANEOUS | Status: DC | PRN
  Filled 2018-12-18: qty 30

## 2018-12-18 MED ORDER — INSULIN REGULAR BOLUS VIA INFUSION
0.0000 [IU] | Freq: Three times a day (TID) | INTRAVENOUS | Status: DC
  Filled 2018-12-18: qty 10

## 2018-12-18 MED ORDER — LORAZEPAM 2 MG/ML IJ SOLN
1.0000 mg | INTRAMUSCULAR | Status: DC | PRN
  Filled 2018-12-18: qty 1

## 2018-12-18 MED ORDER — TAMOXIFEN CITRATE 10 MG PO TABS
20.0000 mg | ORAL_TABLET | Freq: Every day | ORAL | Status: DC
  Administered 2018-12-19 – 2018-12-20 (×2): 20 mg via ORAL
  Filled 2018-12-18 (×3): qty 2

## 2018-12-18 MED ORDER — SODIUM CHLORIDE 0.9 % IV SOLN
INTRAVENOUS | Status: DC
  Administered 2018-12-18: 03:00:00 via INTRAVENOUS

## 2018-12-18 MED ORDER — INSULIN REGULAR(HUMAN) IN NACL 100-0.9 UT/100ML-% IV SOLN
INTRAVENOUS | Status: DC
  Administered 2018-12-18: 5.4 [IU]/h via INTRAVENOUS
  Filled 2018-12-18: qty 100

## 2018-12-18 MED ORDER — LEVETIRACETAM IN NACL 500 MG/100ML IV SOLN
500.0000 mg | INTRAVENOUS | Status: DC | PRN
  Administered 2018-12-19 – 2018-12-20 (×2): 500 mg via INTRAVENOUS
  Filled 2018-12-18: qty 100

## 2018-12-18 MED ORDER — ORAL CARE MOUTH RINSE
15.0000 mL | Freq: Two times a day (BID) | OROMUCOSAL | Status: DC
  Administered 2018-12-18 – 2018-12-19 (×3): 15 mL via OROMUCOSAL

## 2018-12-18 NOTE — Consult Note (Signed)
Reason for Consult: Seizures and altered mental status Referring Physician: Gladstone Lighter, MD  CC: Seizure-like activity and generalized weakness  HPI: Alyssa Floyd is an 53 y.o. female with past medical history of tobacco use, subclinical hypothyroidism, nephrotic syndrome, ESRD on HD, hypertension, hyperlipidemia, type 2 diabetes mellitus, breast cancer, seizures and normocytic anemia presenting to the ED on 12/17/2018 with altered mental status and generalized weakness.  Per ED reports patient's mother checked on her yesterday and found her altered with jerking movements in her right leg.  Apparently episode had been going on for 3 days, gradual in onset and progressively getting worse.  Patient denied missing dialysis or current medication including Keppra.  In the ED she was noted to have generalized muscle twitching worse in right lower extremity and face concerning for possible seizure.  While being worked up in the ED she had a witnessed generalized seizure-like activity lasting 30 seconds with no postictal state reported.  Immediately following the episode patient was alert and oriented to self, place and situation.   Initial lab results revealed Na+ 118, chloride 85, CO2 18, glucose 1,259, BUN 38, creatinine 4.05, calcium 6.7, troponin <0.03, lactic acid 1.7, and hgb 9.2.  CXR concerning for vascular congestion and right sided pleural effusion.  CT head was obtained and did not show acute intracranial abnormality.  Patient was therefore admitted to the stepdown unit with acute encephalopathy, possible seizure and HHNK vs [name] requiring insulin drip.  Past Medical History:  Diagnosis Date  . Breast cancer ([place])   . 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 Diabetes mellitus ([place])   . Diabetic nephropathy ([place])   . Hyperlipidemia   . Hypertension   . Nephrotic syndrome    diabetic nephropathy, biopsy on 01/06/16 at [facility]  . Normocytic anemia   . Seizures ([place])   .  Subclinical hypothyroidism   . Tobacco use   . Vitamin D deficiency     Past Surgical History:  Procedure Laterality Date  . BREAST SURGERY    . PARTIAL HYSTERECTOMY      Family History  Problem Relation Age of Onset  . Diabetes Unknown     Social History:  reports that she has been smoking cigarettes. She has been smoking about 0.50 packs per day. She has never used smokeless tobacco. She reports that she does not drink alcohol or use drugs.  Allergies  Allergen Reactions  . Penicillins Other (See Comments)    Pt reports hallucinations when taking penicillins. >Has patient had a PCN reaction causing immediate rash, facial/tongue/throat swelling, SOB or lightheadedness with hypotension: No Has patient had a PCN reaction causing severe rash involving mucus membranes or skin necrosis: No Has patient had a PCN reaction that required hospitalization No Has patient had a PCN reaction occurring within the last 10 years: No If all of the above answers are "NO", then may proceed with Cephalosporin use.    Medications:  I have reviewed the patient's current medications. Prior to Admission:  Medications Prior to Admission  Medication Sig Dispense Refill Last Dose  . amLODipine (NORVASC) 10 MG tablet Take 1 tablet by mouth daily.   Past Week at Unknown time  . atorvastatin (LIPITOR) 20 MG tablet Take 1 tablet by mouth daily.   Past Week at Unknown time  . calcitRIOL (ROCALTROL) 0.5 MCG capsule Take 1 capsule by mouth daily.   Past Week at Unknown time  . calcium acetate (PHOSLO) 667 MG capsule Take 1 capsule (  667 mg total) by mouth 3 (three) times daily with meals. 90 capsule 1 Past Week at Unknown time  . Cholecalciferol (VITAMIN D-1000 MAX ST) 25 MCG (1000 UT) tablet Take 2,000 Units by mouth daily.   Past Week at Unknown time  . ferrous sulfate 325 (65 FE) MG EC tablet Take 1 tablet by mouth daily.   Past Week at Unknown time  . insulin aspart (NOVOLOG) 100 UNIT/ML injection Inject 4  Units into the skin 3 (three) times daily with meals. Please take fingersticks reading more than 200 10 mL 0 Past Week at Unknown time  . insulin glargine (LANTUS) 100 UNIT/ML injection Inject 0.18 mLs (18 Units total) into the skin daily. 10 mL 11 Past Week at Unknown time  . levETIRAcetam (KEPPRA) 250 MG tablet Take 250 mg by mouth 2 (two) times daily.   Past Week at Unknown time  . potassium chloride (K-DUR,KLOR-CON) 10 MEQ tablet Take 1 tablet by mouth 2 (two) times daily.   Past Week at Unknown time  . sodium bicarbonate 650 MG tablet Take 1 tablet (650 mg total) by mouth 2 (two) times daily. 120 tablet 0 Past Week at Unknown time  . SUMAtriptan (IMITREX) 50 MG tablet TK 1 T PO ONCE PRF MIGRAINE. MAY TK SECOND DOSE AFTER 2 H IF NEEDED. TK AT NIGHT.   prn at prn  . tamoxifen (NOLVADEX) 20 MG tablet Take 20 mg by mouth daily.   Past Week at Unknown time   Scheduled: . amLODipine  10 mg Oral Daily  . atorvastatin  20 mg Oral Daily  . calcitRIOL  0.5 mcg Oral Daily  . calcium acetate  667 mg Oral TID WC  . Chlorhexidine Gluconate Cloth  6 each Topical Q0600  . heparin  5,000 Units Subcutaneous Q8H  . insulin regular  0-10 Units Intravenous TID WC  . mouth rinse  15 mL Mouth Rinse BID  . sodium bicarbonate  650 mg Oral BID  . tamoxifen  20 mg Oral Daily    ROS: History obtained from the patient   General ROS: negative for - chills, fatigue, fever, night sweats, weight gain or weight loss Psychological ROS: negative for - behavioral disorder, hallucinations, memory difficulties, mood swings or suicidal ideation Ophthalmic ROS: negative for - blurry vision, double vision, eye pain or loss of vision ENT ROS: negative for - epistaxis, nasal discharge, oral lesions, sore throat, tinnitus or vertigo Allergy and Immunology ROS: negative for - hives or itchy/watery eyes Hematological and Lymphatic ROS: negative for - bleeding problems, bruising or swollen lymph nodes Endocrine ROS: negative for  - galactorrhea, hair pattern changes, polydipsia/polyuria or temperature intolerance Respiratory ROS: negative for - cough, hemoptysis, shortness of breath or wheezing Cardiovascular ROS: negative for - chest pain, dyspnea on exertion, edema or irregular heartbeat Gastrointestinal ROS: negative for - abdominal pain, diarrhea, hematemesis, nausea/vomiting or stool incontinence Genito-Urinary ROS: negative for - dysuria, hematuria, incontinence or urinary frequency/urgency Musculoskeletal ROS: negative for - joint swelling or muscular weakness Neurological ROS: as noted in HPI Dermatological ROS: negative for rash and skin lesion changes  Physical Examination: Blood pressure 104/62, pulse 70, temperature 98.2 F (36.8 C), temperature source Oral, resp. rate 19, height 5\' 5"  (1.651 m), weight 78.6 kg, SpO2 99 %.  HEENT-  Normocephalic, no lesions, without obvious abnormality.  Normal external eye and conjunctiva.  Normal TM's bilaterally.  Normal auditory canals and external ears. Normal external nose, mucus membranes and septum.  Normal pharynx. Cardiovascular- S1, S2 normal,  pulses palpable throughout   Lungs- chest clear, no wheezing, rales, normal symmetric air entry Abdomen- soft, non-tender; bowel sounds normal; no masses,  no organomegaly Extremities- no edema Lymph-no adenopathy palpable Musculoskeletal-no joint tenderness, deformity or swelling Skin-warm and dry, no hyperpigmentation, vitiligo, or suspicious lesions  Neurological Exam   Mental Status: Alert, oriented, thought content appropriate.  Speech fluent without evidence of aphasia.  Able to follow 3 step commands without difficulty. Attention span and concentration seemed appropriate  Cranial Nerves: II: Discs flat bilaterally; Visual fields grossly normal, pupils equal, round, reactive to light and accommodation III,IV, VI: mild left ptosis present, abnormal saccadic eye movements V,VII: smile symmetric, facial light touch  sensation intact VIII: hearing normal bilaterally IX,X: gag reflex present XI: bilateral shoulder shrug XII: midline tongue extension Motor: Right :  Upper extremity   5/5 Without pronator drift      Left: Upper extremity   5/5 without pronator drift Right:   Lower extremity   5/5                                          Left: Lower extremity   5/5 Tone and bulk:normal tone throughout; no atrophy noted Myoclonic facial movements and right arm jerking movement noted. Sensory: Pinprick and light touch intact bilaterally Deep Tendon Reflexes: 1+ and symmetric with absent ankle jerks bilaterally Plantars: Right: mute                              Left: mute Cerebellar: Finger-to-nose testing intact bilaterally. Heel to shin testing normal bilaterally Gait: not tested due to safety concerns  Data Reviewed  Laboratory Studies:   Basic Metabolic Panel: Recent Labs  Lab 12/17/18 2324 12/18/18 0305  NA 118* 120*  K 3.6 3.7  CL 85* 89*  CO2 18* 16*  GLUCOSE 1,259* 1,170*  BUN 38* 40*  CREATININE 4.05* 4.14*  CALCIUM 6.7* 6.7*  MG 1.8 1.9  PHOS  --  5.2*    Liver Function Tests: Recent Labs  Lab 12/17/18 2324  AST 13*  ALT 12  ALKPHOS 124  BILITOT 1.2  PROT 5.9*  ALBUMIN 2.0*   No results for input(s): LIPASE, AMYLASE in the last 168 hours. No results for input(s): AMMONIA in the last 168 hours.  CBC: Recent Labs  Lab 12/17/18 2324 12/18/18 0305  WBC 7.8 8.3  NEUTROABS 6.6  --   HGB 9.2* 10.3*  HCT 31.7* 35.3*  MCV 89.3 87.8  PLT 235 242    Cardiac Enzymes: Recent Labs  Lab 12/17/18 2324  TROPONINI <0.03    BNP: Invalid input(s): POCBNP  CBG: Recent Labs  Lab 12/18/18 0359 12/18/18 0501 12/18/18 0557 12/18/18 0700 12/18/18 0757  GLUCAP >600* >600* >600* >600* >600*    Microbiology: Results for orders placed or performed during the hospital encounter of 12/17/18  MRSA PCR Screening     Status: None   Collection Time: 12/18/18  3:09 AM   Result Value Ref Range Status   MRSA by PCR NEGATIVE NEGATIVE Final    Comment:        The GeneXpert MRSA Assay (FDA approved for NASAL specimens only), is one component of a comprehensive MRSA colonization surveillance program. It is not intended to diagnose MRSA infection nor to guide or monitor treatment for MRSA infections. Performed at [facility], [phone] [place], [place] [address]     Coagulation Studies: No results for input(s): LABPROT, INR in the last 72 hours.  Urinalysis: No results for input(s): COLORURINE, LABSPEC, PHURINE, GLUCOSEU, HGBUR, BILIRUBINUR, KETONESUR, PROTEINUR, UROBILINOGEN, NITRITE, LEUKOCYTESUR in the last 168 hours.  Invalid input(s): APPERANCEUR  Lipid Panel:  No results found for: CHOL, TRIG, HDL, CHOLHDL, VLDL, LDLCALC  HgbA1C:  Lab Results  Component Value Date   HGBA1C 13.6 (H) 08/11/2018    Urine Drug Screen:      Component Value Date/Time   LABOPIA NONE DETECTED 09/07/2018 1041   LABOPIA NONE DETECTED 01/29/2016 2316   COCAINSCRNUR NONE DETECTED 09/07/2018 1041   LABBENZ NONE DETECTED 09/07/2018 1041   LABBENZ NONE DETECTED 01/29/2016 2316   AMPHETMU NONE DETECTED 09/07/2018 1041   AMPHETMU NONE DETECTED 01/29/2016 2316   THCU NONE DETECTED 09/07/2018 1041   THCU NONE DETECTED 01/29/2016 2316   LABBARB NONE DETECTED 09/07/2018 1041   LABBARB NONE DETECTED 01/29/2016 2316    Alcohol Level: No results for input(s): ETH in the last 168 hours.  Other results: EKG: normal EKG, normal sinus rhythm, unchanged from previous tracings. Vent. rate 87 BPM PR interval * ms QRS duration 87 ms QT/QTc 365/440 ms P-R-T axes 60 79 15  Imaging: Ct Head Wo Contrast  Result Date: 12/18/2018 CLINICAL DATA:  Acute onset of altered mental status EXAM: CT HEAD WITHOUT CONTRAST TECHNIQUE: Contiguous axial images were obtained from the base of the skull through the vertex without intravenous contrast. COMPARISON:  09/07/2018  head CT, MRI 08/11/2018 FINDINGS: Brain: No acute territorial infarction, hemorrhage or intracranial mass. Mild atrophy. Ventricle size is normal. Vascular: No hyperdense vessels.  No unexpected calcification. Skull: Normal. Negative for fracture or focal lesion. Sinuses/Orbits: Mild mucosal thickening in the ethmoid and sphenoid sinus. Other: None IMPRESSION: 1. No CT evidence for acute intracranial abnormality. 2. Mild atrophy Electronically Signed   By: Donavan Foil M.D.   On: 12/18/2018 02:52   Ct Chest Wo Contrast  Result Date: 12/18/2018 CLINICAL DATA:  Altered mental status, abnormal chest x-ray EXAM: CT CHEST WITHOUT CONTRAST TECHNIQUE: Multidetector CT imaging of the chest was performed following the standard protocol without IV contrast. COMPARISON:  12/18/2018 FINDINGS: Cardiovascular: Limited evaluation without intravenous contrast. Bilateral central venous catheters, right-sided central venous catheter tip terminates at the low right atrium. Aorta is nonaneurysmal. Possible dilatation of the pulmonary trunk up to 3.5 cm. Cardiomegaly. Small pericardial effusion. Mediastinum/Nodes: Midline trachea. Coarse calcification at the left lobe of thyroid. Esophagus within normal limits. Enlarged lymph nodes adjacent to the aortic arch measuring 11 mm. Right low paratracheal lymph node measuring 1 cm. Lungs/Pleura: Large right-sided pleural effusion. Partial consolidations within the right upper, middle and lower lobes. Small left pleural effusion. No focal airspace disease on the left. Mild emphysematous disease at the left apex. Upper Abdomen: No acute abnormality. Musculoskeletal: Diffuse anasarca. Bilateral breast skin thickening. Sclerosis within the upper sternum, possible prior fracture. No acute abnormality. IMPRESSION: 1. Large right-sided pleural effusion with partial consolidations in the right upper, middle and lower lobes which may reflect atelectasis or multifocal pneumonia. There is trace left  pleural effusion. 2. Cardiomegaly with small pericardial effusion.  Diffuse anasarca. 3. Possible enlargement of the pulmonary trunk as may be seen with pulmonary hypertension 4. Mild mediastinal adenopathy, possibly reactive Emphysema (ICD10-J43.9). Electronically Signed   By: Donavan Foil M.D.   On: 12/18/2018 03:02   Dg Chest Portable 1 View  Result Date: 12/18/2018 CLINICAL DATA:  Weakness  EXAM: PORTABLE CHEST 1 VIEW COMPARISON:  08/14/2018 FINDINGS: Left-sided central venous port tip over the SVC. Right-sided central venous catheter tip over the low right atrium. Large right-sided pleural effusion. Cardiomegaly with vascular congestion and suspected pulmonary edema. No pneumothorax. Clips in the right axilla. There is opacity at the right middle lobe and right base. IMPRESSION: 1. Large right-sided pleural effusion. 2. Cardiomegaly with vascular congestion and suspected mild pulmonary edema. 3. Consolidation at the right middle lobe and right base, atelectasis versus pneumonia. Electronically Signed   By: Donavan Foil M.D.   On: 12/18/2018 00:40   Assessment: 53 year old female presenting with chief complaints of generalized weakness, altered mental status and seizure-like activity.  Found to be in [name] requiring insulin gtt. Patient has been evaluated for similar episode on 07/2018 and at that time work-up including CT head, MRI of the brain and EEG was unremarkable.  Patient was discharged on V[place]pat which was later switched to [place].  Patient reports she has been taking Keppra as prescribed without missing doses.  Mental status appears to be back to baseline with intermittent jerking movements of the face, right arm and leg noted without alteration of awareness. CT head reviewed and shows no acute changes or etiology for seizures. Seizure-like activity likely provoked in the setting of metabolic derangement.  Plan: 1. EEG pending 2. Seizure precautions 3. Ativan prn seizure activity 4. Continue  Keppra 500mg  IV BID.   5. Agree with current medical management of underlying medical condition  This patient was staffed with Roland Rack who personally evaluated patient, reviewed documentation and agreed with assessment and plan of care as above.  Rufina Falco, DNP, FNP-BC Board certified Nurse Practitioner Neurology Department  12/18/2018, 8:49 AM   I have seen the patient and reviewed the above note.  She describes unilateral twitching-like activity going on for 3 days.  Currently I do not see any twitching on my exam, but she does have what appear to be very prominent square wave jerks on her ophthalmologic exam.  Despite this, she has full extraocular movements.  She has multiple metabolic abnormalities, and is currently undergoing dialysis.  Her mental status seems relatively preserved, but I am concerned that this might represent simple partial status epilepticus.  She has had seizures in the past, usually in the setting of metabolic derangements.  1) EEG 2) further titration of seizure medicines following EEG  Roland Rack, MD [facility] [phone]  If 7pm- 7am, please page neurology on call as listed in [place].

## 2018-12-18 NOTE — Progress Notes (Signed)
HD completed. Treatment stopped with 10 minutes remaining due to increasing vp and venous chamber attempting to clot. No heparin treatment. All blood able to be returned. Patient currently denies complaints. Able to UF 2.5L. Report given to primary RN.

## 2018-12-18 NOTE — Progress Notes (Signed)
[facility]  ROUNDING NOTE   Subjective:   [name] admitted to [facility] on 12/17/2018 for Tremor [R25.1] Generalized weakness [R53.1] Diabetes mellitus with hyperosmolarity without hyperglycemic hyperosmolar nonketotic coma ([place]) [E11.00] Type 2 diabetes mellitus with hyperglycemia, with long-term current use of insulin ([place]) [E11.65, Z79.4]  Last hemodialysis was Saturday. Completed treatment.   Objective:  Vital signs in last 24 hours:  Temp:  [97.1 F (36.2 C)-98.2 F (36.8 C)] 98.2 F (36.8 C) (01/21 0745) Pulse Rate:  [35-175] 70 (01/21 0830) Resp:  [16-26] 26 (01/21 0830) BP: (104-175)/(62-110) 104/62 (01/21 0830) SpO2:  [91 %-99 %] 98 % (01/21 0830) Weight:  [69.9 kg-78.6 kg] 78.6 kg (01/21 0745)  Weight change:  Filed Weights   12/17/18 2319 12/18/18 0300 12/18/18 0745  Weight: 69.9 kg 78.6 kg 78.6 kg    Intake/Output: I/O last 3 completed shifts: In: 402.5 [I.V.:197.6; IV Piggyback:205] Out: -    Intake/Output this shift:  Total I/O In: 37.3 [I.V.:37.3] Out: -   Physical Exam: General: Critically ill  Head: Normocephalic, atraumatic. Moist oral mucosal membranes  Eyes: Anicteric, PERRL  Neck: Supple, trachea midline  Lungs:  Clear to auscultation, 2 L [place]  Heart: Regular rate and rhythm  Abdomen:  Soft, nontender, obese  Extremities: 1+ peripheral edema.  Neurologic: Nonfocal, moving all four extremities  Skin: No lesions  Access: RIJ permcath    Basic Metabolic Panel: Recent Labs  Lab 12/17/18 2324 12/18/18 0305  NA 118* 120*  K 3.6 3.7  CL 85* 89*  CO2 18* 16*  GLUCOSE 1,259* 1,170*  BUN 38* 40*  CREATININE 4.05* 4.14*  CALCIUM 6.7* 6.7*  MG 1.8 1.9  PHOS  --  5.2*    Liver Function Tests: Recent Labs  Lab 12/17/18 2324  AST 13*  ALT 12  ALKPHOS 124  BILITOT 1.2  PROT 5.9*  ALBUMIN 2.0*   No results for input(s): LIPASE, AMYLASE in the last 168 hours. No results for input(s): AMMONIA in the last 168  hours.  CBC: Recent Labs  Lab 12/17/18 2324 12/18/18 0305  WBC 7.8 8.3  NEUTROABS 6.6  --   HGB 9.2* 10.3*  HCT 31.7* 35.3*  MCV 89.3 87.8  PLT 235 242    Cardiac Enzymes: Recent Labs  Lab 12/17/18 2324  TROPONINI <0.03    BNP: Invalid input(s): POCBNP  CBG: Recent Labs  Lab 12/18/18 0359 12/18/18 0501 12/18/18 0557 12/18/18 0700 12/18/18 0757  GLUCAP >600* >600* >600* >600* >600*    Microbiology: Results for orders placed or performed during the hospital encounter of 12/17/18  MRSA PCR Screening     Status: None   Collection Time: 12/18/18  3:09 AM  Result Value Ref Range Status   MRSA by PCR NEGATIVE NEGATIVE Final    Comment:        The GeneXpert MRSA Assay (FDA approved for NASAL specimens only), is one component of a comprehensive MRSA colonization surveillance program. It is not intended to diagnose MRSA infection nor to guide or monitor treatment for MRSA infections. Performed at [facility], [address] [place], [place] [address]     Coagulation Studies: No results for input(s): LABPROT, INR in the last 72 hours.  Urinalysis: No results for input(s): COLORURINE, LABSPEC, PHURINE, GLUCOSEU, HGBUR, BILIRUBINUR, KETONESUR, PROTEINUR, UROBILINOGEN, NITRITE, LEUKOCYTESUR in the last 72 hours.  Invalid input(s): APPERANCEUR    Imaging: Ct Head Wo Contrast  Result Date: 12/18/2018 CLINICAL DATA:  Acute onset of altered mental status EXAM:  CT HEAD WITHOUT CONTRAST TECHNIQUE: Contiguous axial images were obtained from the base of the skull through the vertex without intravenous contrast. COMPARISON:  09/07/2018 head CT, MRI 08/11/2018 FINDINGS: Brain: No acute territorial infarction, hemorrhage or intracranial mass. Mild atrophy. Ventricle size is normal. Vascular: No hyperdense vessels.  No unexpected calcification. Skull: Normal. Negative for fracture or focal lesion. Sinuses/Orbits: Mild mucosal thickening in the ethmoid and  sphenoid sinus. Other: None IMPRESSION: 1. No CT evidence for acute intracranial abnormality. 2. Mild atrophy Electronically Signed   By: Donavan Foil M.D.   On: 12/18/2018 02:52   Ct Chest Wo Contrast  Result Date: 12/18/2018 CLINICAL DATA:  Altered mental status, abnormal chest x-ray EXAM: CT CHEST WITHOUT CONTRAST TECHNIQUE: Multidetector CT imaging of the chest was performed following the standard protocol without IV contrast. COMPARISON:  12/18/2018 FINDINGS: Cardiovascular: Limited evaluation without intravenous contrast. Bilateral central venous catheters, right-sided central venous catheter tip terminates at the low right atrium. Aorta is nonaneurysmal. Possible dilatation of the pulmonary trunk up to 3.5 cm. Cardiomegaly. Small pericardial effusion. Mediastinum/Nodes: Midline trachea. Coarse calcification at the left lobe of thyroid. Esophagus within normal limits. Enlarged lymph nodes adjacent to the aortic arch measuring 11 mm. Right low paratracheal lymph node measuring 1 cm. Lungs/Pleura: Large right-sided pleural effusion. Partial consolidations within the right upper, middle and lower lobes. Small left pleural effusion. No focal airspace disease on the left. Mild emphysematous disease at the left apex. Upper Abdomen: No acute abnormality. Musculoskeletal: Diffuse anasarca. Bilateral breast skin thickening. Sclerosis within the upper sternum, possible prior fracture. No acute abnormality. IMPRESSION: 1. Large right-sided pleural effusion with partial consolidations in the right upper, middle and lower lobes which may reflect atelectasis or multifocal pneumonia. There is trace left pleural effusion. 2. Cardiomegaly with small pericardial effusion.  Diffuse anasarca. 3. Possible enlargement of the pulmonary trunk as may be seen with pulmonary hypertension 4. Mild mediastinal adenopathy, possibly reactive Emphysema (ICD10-J43.9). Electronically Signed   By: Donavan Foil M.D.   On: 12/18/2018 03:02    Dg Chest Portable 1 View  Result Date: 12/18/2018 CLINICAL DATA:  Weakness EXAM: PORTABLE CHEST 1 VIEW COMPARISON:  08/14/2018 FINDINGS: Left-sided central venous port tip over the SVC. Right-sided central venous catheter tip over the low right atrium. Large right-sided pleural effusion. Cardiomegaly with vascular congestion and suspected pulmonary edema. No pneumothorax. Clips in the right axilla. There is opacity at the right middle lobe and right base. IMPRESSION: 1. Large right-sided pleural effusion. 2. Cardiomegaly with vascular congestion and suspected mild pulmonary edema. 3. Consolidation at the right middle lobe and right base, atelectasis versus pneumonia. Electronically Signed   By: Donavan Foil M.D.   On: 12/18/2018 00:40     Medications:   . sodium chloride     And  . sodium chloride 50 mL/hr at 12/18/18 0400  . sodium chloride    . sodium chloride    . dextrose 5 % and 0.45% NaCl Stopped (12/18/18 0747)  . insulin 21.6 Units/hr (12/18/18 0828)  . levETIRAcetam     . amLODipine  10 mg Oral Daily  . atorvastatin  20 mg Oral Daily  . calcitRIOL  0.5 mcg Oral Daily  . calcium acetate  667 mg Oral TID WC  . Chlorhexidine Gluconate Cloth  6 each Topical Q0600  . heparin  5,000 Units Subcutaneous Q8H  . insulin regular  0-10 Units Intravenous TID WC  . mouth rinse  15 mL Mouth Rinse BID  . sodium bicarbonate  650 mg Oral BID  . tamoxifen  20 mg Oral Daily   sodium chloride, sodium chloride, alteplase, dextrose, heparin, lidocaine (PF), lidocaine-prilocaine, LORazepam, pentafluoroprop-tetrafluoroeth  Assessment/ Plan:  Ms. Jolin Benavides is a 53 y.o. black female with diabetes mellitus type II insulin dependent, hyperlipidemia, hypertension, seizure disorder  [facility] TTS [place] 73.5kg RIJ permcath  1. End Stage Renal Disease: on hemodialysis TTS Emergent hemodialysis treatment for pulmonary edema.  UF goal of 2-3 liters.  Myoclonic jerking is less  likely due to hemodialysis with dialysis compliance.  Evaluate daily for dialysis need.   2. Hyponatremia: secondary to volume overload. Na 140 bath - serial sodium checks.   3. Hypertension: blood pressure at goal.   4. Metabolic acidosis: Anion gap corrected for albumin is 20.  Diabetic ketoacidosis Secondary to uncontrolled diabetes mellitus type II with chronic kidney disease.  - insulin gtt - NS infusion as per [name] protocal.   5. Anemia with chronic kidney disease: hemoglobin 10.3 - Mircera as outpatient.   6. Secondary Hyperparathyroidism: corrected calcium 8.3  - restart calcium acetate when resuming PO.  - Calcitriol as outpatient.   7. Seizure disorder: previously on lacosamide. Now on levetiracetam. Both agents are dialyzed so an extra dose must be given after treatment.  Appreciate Neurology input.   LOS: 0 [place] 1/21/20208:39 AM

## 2018-12-18 NOTE — Consult Note (Addendum)
Name: Alyssa Floyd MRN: [id number] DOB: 06-28-1966    ADMISSION DATE:  12/17/2018 CONSULTATION DATE: 12/18/2018  REFERRING MD : Dr. Posey Pronto   CHIEF COMPLAINT: Confusion   BRIEF PATIENT DESCRIPTION:  53 yo female admitted with acute encephalopathy, possible seizure activity, and HHNK vs. [name] requiring insulin gtt   SIGNIFICANT EVENTS/STUDIES:  01/21-Pt admitted to stepdown unit on insulin gtt  01/21-CT Head revealed no CT evidence for acute intracranial abnormality. Mild atrophy 01/21-CT Chest revealed Large right-sided pleural effusion with partial consolidations in the right upper, middle and lower lobes which may reflect atelectasis or multifocal pneumonia. There is trace left pleural effusion. Cardiomegaly with small pericardial effusion.  Diffuse anasarca. Possible enlargement of the pulmonary trunk as may be seen with pulmonary hypertension. Mild mediastinal adenopathy, possibly reactive Emphysema  HISTORY OF PRESENT ILLNESS:   This is a 53 yo female with a PMH of Vitamin D Deficiency, Tobacco Use, Subclinical Hypothyroidism, Seizures, Normocytic Anemia, Nephrotic Syndrome, ESRD on HD, HTN, Hyperlipidemia, Type II Diabetes Mellitus, Chronic Diarrhea, and Breast Cancer.  She presented to [facility] ER on 01/20 via EMS with weakness, generalized muscle twitching worse in right lower extremity concerning for possible seizure, and confusion onset of symptoms 3 days prior to arrival.  Per ER notes pt reported generalized weakness is severe and she has been unable to support herself or walk on her own.  Lab results revealed Na+ 118, chloride 85, CO2 18, glucose 1,259, BUN 38, creatinine 4.05, calcium 6.7, troponin <0.03, lactic acid 1.7, and hgb 9.2.  CXR concerning for vascular congestion and right sided pleural effusion.  Due to lab results pt received 2NS bolus and insulin gtt initiated.  In the ER pt alert and oriented.  She was subsequently admitted to the stepdown unit by hospitalist team for  additional workup and treatment. Upon arrival to ICU pt had generalized seizure-like activity that lasted for about 30 seconds.  Immediately following episode pt alert, able to state her name, and oriented to place.  PAST MEDICAL HISTORY :   has a past medical history of Breast cancer ([place]), Chronic diarrhea, Diabetes mellitus ([place]), Diabetic nephropathy ([place]), Hyperlipidemia, Hypertension, Nephrotic syndrome, Normocytic anemia, Seizures ([place]), Subclinical hypothyroidism, Tobacco use, and Vitamin D deficiency.  has a past surgical history that includes Partial hysterectomy and Breast surgery. Prior to Admission medications   Medication Sig Start Date End Date Taking? Authorizing Provider  amLODipine (NORVASC) 10 MG tablet Take 1 tablet by mouth daily. 09/19/18  Yes [provider]  atorvastatin (LIPITOR) 20 MG tablet Take 1 tablet by mouth daily. 08/31/18  Yes [provider]  calcitRIOL (ROCALTROL) 0.5 MCG capsule Take 1 capsule by mouth daily. 10/05/18  Yes [provider]  calcium acetate (PHOSLO) 667 MG capsule Take 1 capsule (667 mg total) by mouth 3 (three) times daily with meals. 09/07/18  Yes Earleen Newport, MD  Cholecalciferol (VITAMIN D-1000 MAX ST) 25 MCG (1000 UT) tablet Take 2,000 Units by mouth daily. 09/19/18  Yes [provider]  ferrous sulfate 325 (65 FE) MG EC tablet Take 1 tablet by mouth daily. 10/17/18 10/17/19 Yes [provider]  insulin aspart (NOVOLOG) 100 UNIT/ML injection Inject 4 Units into the skin 3 (three) times daily with meals. Please take fingersticks reading more than 200 07/11/18  Yes Elgergawy, Silver Huguenin, MD  insulin glargine (LANTUS) 100 UNIT/ML injection Inject 0.18 mLs (18 Units total) into the skin daily. 08/17/18  Yes Mody, Ulice Bold, MD  levETIRAcetam (KEPPRA) 250 MG tablet Take 250  mg by mouth 2 (two) times daily. 09/19/18  Yes [provider]  potassium chloride (K-DUR,KLOR-CON) 10 MEQ tablet Take 1  tablet by mouth 2 (two) times daily. 10/05/18  Yes [provider]  sodium bicarbonate 650 MG tablet Take 1 tablet (650 mg total) by mouth 2 (two) times daily. 08/16/18  Yes Mody, Sital, MD  SUMAtriptan (IMITREX) 50 MG tablet TK 1 T PO ONCE PRF MIGRAINE. MAY TK SECOND DOSE AFTER 2 H IF NEEDED. TK AT NIGHT. 09/19/18  Yes [provider]  tamoxifen (NOLVADEX) 20 MG tablet Take 20 mg by mouth daily.   Yes [provider]   Allergies  Allergen Reactions  . Penicillins Other (See Comments)    Pt reports hallucinations when taking penicillins. >Has patient had a PCN reaction causing immediate rash, facial/tongue/throat swelling, SOB or lightheadedness with hypotension: No Has patient had a PCN reaction causing severe rash involving mucus membranes or skin necrosis: No Has patient had a PCN reaction that required hospitalization No Has patient had a PCN reaction occurring within the last 10 years: No If all of the above answers are "NO", then may proceed with Cephalosporin use.    FAMILY HISTORY:  family history includes Diabetes in her unknown relative. SOCIAL HISTORY:  reports that she has been smoking cigarettes. She has been smoking about 0.50 packs per day. She has never used smokeless tobacco. She reports that she does not drink alcohol or use drugs.  REVIEW OF SYSTEMS: Positives in BOLD    Constitutional: Negative for fever, chills, weight loss, malaise/fatigue and diaphoresis.  HENT: Negative for hearing loss, ear pain, nosebleeds, congestion, sore throat, neck pain, tinnitus and ear discharge.   Eyes: Negative for blurred vision, double vision, photophobia, pain, discharge and redness.  Respiratory: Negative for cough, hemoptysis, sputum production, shortness of breath, wheezing and stridor.   Cardiovascular: Negative for chest pain, palpitations, orthopnea, claudication, leg swelling and PND.  Gastrointestinal: Negative for heartburn, nausea, vomiting, abdominal  pain, diarrhea, constipation, blood in stool and melena.  Genitourinary: Negative for dysuria, urgency, frequency, hematuria and flank pain.  Musculoskeletal: Negative for myalgias, back pain, joint pain and falls.  Skin: Negative for itching and rash.  Neurological: confusion, dizziness, tingling, tremors, sensory change, speech change, focal weakness, seizures, loss of consciousness, weakness and headaches.  Endo/Heme/Allergies: Negative for environmental allergies and polydipsia. Does not bruise/bleed easily.  SUBJECTIVE:  c/o muscle twitching  VITAL SIGNS: Temp:  [97.7 F (36.5 C)] 97.7 F (36.5 C) (01/20 2318) Pulse Rate:  [35-93] 87 (01/21 0100) Resp:  [16-26] 26 (01/21 0100) BP: (159-175)/(69-77) 172/77 (01/21 0100) SpO2:  [91 %-98 %] 94 % (01/21 0100) Weight:  [69.9 kg] 69.9 kg (01/20 2319)  PHYSICAL EXAMINATION: General: acutely ill appearing female, NAD  Neuro: alert, oriented to place and self, follows commands, PERRL, RUE and RLE motor strength 4/5, LLE and LUE motor strength 5/5 HEENT: supple, no JVD  Cardiovascular: nasr, rrr, no R/G Lungs: diminished throughout right and rhonchi present all other lobes, even, non labored  Abdomen: +BS x4, soft, non tender, non distended  Musculoskeletal: RLE and RUE muscle twitching  Skin: intact no rashes or lesions present   Recent Labs  Lab 12/17/18 2324  NA 118*  K 3.6  CL 85*  CO2 18*  BUN 38*  CREATININE 4.05*  GLUCOSE 1,259*   Recent Labs  Lab 12/17/18 2324  HGB 9.2*  HCT 31.7*  WBC 7.8  PLT 235   Dg Chest Portable 1 View  Result  Date: 12/18/2018 CLINICAL DATA:  Weakness EXAM: PORTABLE CHEST 1 VIEW COMPARISON:  08/14/2018 FINDINGS: Left-sided central venous port tip over the SVC. Right-sided central venous catheter tip over the low right atrium. Large right-sided pleural effusion. Cardiomegaly with vascular congestion and suspected pulmonary edema. No pneumothorax. Clips in the right axilla. There is opacity at  the right middle lobe and right base. IMPRESSION: 1. Large right-sided pleural effusion. 2. Cardiomegaly with vascular congestion and suspected mild pulmonary edema. 3. Consolidation at the right middle lobe and right base, atelectasis versus pneumonia. Electronically Signed   By: Donavan Foil M.D.   On: 12/18/2018 00:40    ASSESSMENT / PLAN:  Right pleural effusion and vascular congestion on CXR 12/18/2018 Supplemental O2 for dyspnea and/or hypoxia  May need right sided thoracentesis   Hyperosmolar Hyperglycemic Non-Ketoacidosis vs. [name]  Hyponatremia secondary to dehydration  Continue insulin gtt until 4 consecutive CBG's <180 CBG's q1hr while on insulin gtt  Diabetes coordinator consulted appreciate input   ESRD on Hemodialysis  Hyponatremia secondary to dehydration  Metabolic acidosis  NS @10  ml/hr Continuous telemetry monitoring Trend BMP  Replace electrolytes as indicated  Monitor UOP  Continue outpatient sodium bicarbonate  Nephrology consulted appreciate input-HD per recommendations   Anemia without obvious acute blood loss   VTE px: subq heparin  Trend CBC Monitor for s/sx of bleeding and transfuse for hgb <7  Mild acute encephalopathy Questionable seizure activity  Hx: Seizures  Continue iv keppra  Continue keppra  Prn ativan for seizure activity  EEG pending Neurology consulted appreciate input   Marda Stalker, [place] Pager [phone] (please enter 7 digits) PCCM Consult Pager [phone] (please enter 7 digits)

## 2018-12-18 NOTE — Progress Notes (Signed)
Bedside EEG completed, results pending. 

## 2018-12-18 NOTE — Progress Notes (Signed)
Pre hd 

## 2018-12-18 NOTE — Progress Notes (Signed)
Her movements resolved prior to EEG being performed.  I continue concerned that these represent simple partial seizures, and I would favor increasing her Keppra dose.  With her dialysis dependence, I would favor dosing her Keppra as 1 g daily with an additional 500 mg after each dialysis.  I have changed the orders to reflect this.  Roland Rack, MD [facility] [phone]  If 7pm- 7am, please page neurology on call as listed in [place].

## 2018-12-18 NOTE — Progress Notes (Signed)
eLink Physician-Brief Progress Note Patient Name: Alyssa Floyd DOB: 13-Sep-1966 MRN: [id number]   Date of Service  12/18/2018  HPI/Events of Note  53 yo with PMH of ESRD on HD, DM and seizures. Presents and confusion. Blood glucose found to be 1200. Insulin IV infusion being started. PCCM asked to assume care in ICU. VSS.  eICU Interventions  No new orders.      Intervention Category Evaluation Type: New Patient Evaluation  Lysle Dingwall 12/18/2018, 3:03 AM

## 2018-12-18 NOTE — Procedures (Addendum)
History: 53 yo F with abnormal eye movements, right sided twitching, resolved prior to EEG.   Sedation: None  Technique: This is a 21 channel routine scalp EEG performed at the bedside with bipolar and monopolar montages arranged in accordance to the international 10/20 system of electrode placement. One channel was dedicated to EKG recording.    Background: The background is moderately disorganized consisting primarily of generalized high voltage irregular delta and theta activities.  There is a posterior dominant rhythm of 7 Hz which is well sustained despite the admixed slow activity.  There are frequent periodic triphasic waves occurring with a frequency of 1 Hz which are bifrontally predominant with an anterior posterior lag  Photic stimulation: Physiologic driving is not performed  EEG Abnormalities: 1) triphasic waves 2) generalized irregular slow activity 3) slow PDR  Clinical Interpretation: This EEG is consistent with a generalized nonspecific cerebral dysfunction (encephalopathy).  There was no seizure or seizure predisposition recorded on this study. Please note that lack of epileptiform activity on EEG does not preclude the possibility of epilepsy.   Alyssa Rack, MD [facility] [phone]  If 7pm- 7am, please page neurology on call as listed in [place].

## 2018-12-18 NOTE — Progress Notes (Signed)
Abg results called dana np

## 2018-12-18 NOTE — ED Provider Notes (Signed)
[facility] Emergency Department Provider Note  ____________________________________________   First MD Initiated Contact with Patient 12/17/18 2338     (approximate)  I have reviewed the triage vital signs and the nursing notes.   HISTORY  Chief Complaint Weakness  Level 5 caveat:  history/ROS limited by altered mental status/confusion   HPI Alyssa Floyd is a 53 y.o. female with extensive chronic medical history including insulin-dependent diabetes and ESRD on dialysis, nonspecific seizure disorder, and a past history of breast cancer.  She presents by EMS for altered mental status and weakness.  Her mother checked on her today and could tell immediately that something was wrong.  She asked the patient how long she had been feeling weak and having some shaking episodes in her right leg.  The patient says it is been going on for at least 3 days, gradual in onset and steadily getting worse.  She says she takes all of her medicine including her insulin and her Keppra but her leg will not stop shaking and sometimes she has some generalized shaking of her whole body, but she is awake and alert throughout the episodes.  She denies headache and any falls or head trauma.  She denies chest pain, shortness of breath, nausea, vomiting, and abdominal pain.  She still produces urine and goes to the bathroom a couple of times a day.  She has not been short of breath recently and denies cough, nasal congestion, or other viral symptoms.  She denies fever and chills.  However her generalized weakness is severe and she is not able to support herself or walk on her own.  She also is having increasingly frequent episodes where primarily her right leg is twitching but she also sometimes has twitching of her whole body.  Past Medical History:  Diagnosis Date  . Breast cancer ([place])   . 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 Diabetes mellitus ([place])   .  Diabetic nephropathy ([place])   . Hyperlipidemia   . Hypertension   . Nephrotic syndrome    diabetic nephropathy, biopsy on 01/06/16 at [facility]  . Normocytic anemia   . Seizures ([place])   . Subclinical hypothyroidism   . Tobacco use   . Vitamin D deficiency     Patient Active Problem List   Diagnosis Date Noted  . Hyperglycemia 12/18/2018  . [name] (diabetic ketoacidoses) ([place]) 07/08/2018  . Dehydration   . Dysarthria   . HHNC (hyperglycemic hyperosmolar nonketotic coma) ([place])   . Acute encephalopathy   . Acute renal failure (ARF) ([place]) 01/29/2016  . [name], type 1 ([place]) 01/29/2016  . Prolonged Q-T interval on ECG 01/29/2016  . Hyperlipidemia 01/29/2016  . Hypertension 01/29/2016  . Chronic diarrhea 01/29/2016  . Nephrotic syndrome 01/29/2016  . Tobacco use 01/29/2016  . 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 Normocytic anemia 01/29/2016  . Subclinical hypothyroidism 01/29/2016  . Encephalopathy acute     Past Surgical History:  Procedure Laterality Date  . BREAST SURGERY    . PARTIAL HYSTERECTOMY      Prior to Admission medications   Medication Sig Start Date End Date Taking? Authorizing Provider  amLODipine (NORVASC) 10 MG tablet Take 1 tablet by mouth daily. 09/19/18  Yes [provider]  atorvastatin (LIPITOR) 20 MG tablet Take 1 tablet by mouth daily. 08/31/18  Yes [provider]  calcitRIOL (ROCALTROL) 0.5 MCG capsule Take 1 capsule by mouth daily. 10/05/18  Yes [provider]  calcium acetate (  PHOSLO) 667 MG capsule Take 1 capsule (667 mg total) by mouth 3 (three) times daily with meals. 09/07/18  Yes Earleen Newport, MD  Cholecalciferol (VITAMIN D-1000 MAX ST) 25 MCG (1000 UT) tablet Take 2,000 Units by mouth daily. 09/19/18  Yes [provider]  ferrous sulfate 325 (65 FE) MG EC tablet Take 1 tablet by mouth daily. 10/17/18 10/17/19 Yes [provider]  insulin aspart (NOVOLOG) 100 UNIT/ML injection Inject 4 Units into the skin 3  (three) times daily with meals. Please take fingersticks reading more than 200 07/11/18  Yes Elgergawy, Silver Huguenin, MD  insulin glargine (LANTUS) 100 UNIT/ML injection Inject 0.18 mLs (18 Units total) into the skin daily. 08/17/18  Yes Mody, Ulice Bold, MD  levETIRAcetam (KEPPRA) 250 MG tablet Take 250 mg by mouth 2 (two) times daily. 09/19/18  Yes [provider]  potassium chloride (K-DUR,KLOR-CON) 10 MEQ tablet Take 1 tablet by mouth 2 (two) times daily. 10/05/18  Yes [provider]  sodium bicarbonate 650 MG tablet Take 1 tablet (650 mg total) by mouth 2 (two) times daily. 08/16/18  Yes Mody, Sital, MD  SUMAtriptan (IMITREX) 50 MG tablet TK 1 T PO ONCE PRF MIGRAINE. MAY TK SECOND DOSE AFTER 2 H IF NEEDED. TK AT NIGHT. 09/19/18  Yes [provider]  tamoxifen (NOLVADEX) 20 MG tablet Take 20 mg by mouth daily.   Yes [provider]    Allergies Penicillins  Family History  Problem Relation Age of Onset  . Diabetes Unknown     Social History Social History   Tobacco Use  . Smoking status: Current Every Day Smoker    Packs/day: 0.50    Types: Cigarettes  . Smokeless tobacco: Never Used  Substance Use Topics  . Alcohol use: No  . Drug use: No    Review of Systems Level 5 caveat:  history/ROS limited by acute/critical illness  Constitutional: No fever/chills Eyes: No visual changes. ENT: No sore throat. Cardiovascular: Denies chest pain. Respiratory: Denies shortness of breath. Gastrointestinal: No abdominal pain.  No nausea, no vomiting.  No diarrhea.  No constipation. Genitourinary: Negative for dysuria. Musculoskeletal: Negative for neck pain.  Negative for back pain. Integumentary: Negative for rash. Neurological: Negative for headaches, focal weakness or numbness.   ____________________________________________   PHYSICAL EXAM:  VITAL SIGNS: ED Triage Vitals  Enc Vitals Group     BP 12/17/18 2318 (!) 175/77     Pulse Rate 12/17/18 2318  88     Resp 12/17/18 2318 18     Temp 12/17/18 2318 97.7 F (36.5 C)     Temp Source 12/17/18 2318 Oral     SpO2 12/17/18 2318 95 %     Weight 12/17/18 2319 69.9 kg (154 lb)     Height 12/17/18 2319 1.626 m (5\' 4" )     Head Circumference --      Peak Flow --      Pain Score --      Pain Loc --      Pain Edu? --      Excl. in [place]? --     Constitutional: Alert but confused.  No acute distressed.  Appears ill but non-toxic. Eyes: Conjunctivae are normal.  Head: Atraumatic. Nose: No congestion/rhinnorhea. Mouth/Throat: Mucous membranes are dry. Neck: No stridor.  No meningeal signs.   Cardiovascular: Normal rate but with intermittent sinus bradycardia, regular rhythm. Good peripheral circulation. Grossly normal heart sounds. Respiratory: Normal respiratory effort with no retractions.  No cough.  Patient  is lying nearly supine without any indication of respiratory difficulty.  Lungs are generally clear. Gastrointestinal: Soft and nontender. No distention.  Musculoskeletal: 1+ pitting edema bilateral lower extremities as well as bilateral upper extremities. No gross deformities of extremities. Neurologic:  Normal speech and language. No gross focal neurologic deficits are appreciated.  The patient is having some shaking episodes in her right lower extremity.  While I was in the room she had more protracted shaking of all of her extremities, but she remained conversant throughout.  Not consistent with generalized tonic-clonic seizure activity. Skin:  Skin is warm, dry and intact. No rash noted.   ____________________________________________   LABS (all labs ordered are listed, but only abnormal results are displayed)  Labs Reviewed  CBC WITH DIFFERENTIAL/PLATELET - Abnormal; Notable for the following components:      Result Value   RBC 3.55 (*)    Hemoglobin 9.2 (*)    HCT 31.7 (*)    MCH 25.9 (*)    MCHC 29.0 (*)    RDW 15.9 (*)    Lymphs Abs 0.3 (*)    All other components  within normal limits  COMPREHENSIVE METABOLIC PANEL - Abnormal; Notable for the following components:   Sodium 118 (*)    Chloride 85 (*)    CO2 18 (*)    Glucose, Bld 1,259 (*)    BUN 38 (*)    Creatinine, Ser 4.05 (*)    Calcium 6.7 (*)    Total Protein 5.9 (*)    Albumin 2.0 (*)    AST 13 (*)    GFR calc non Af Amer 12 (*)    GFR calc Af Amer 14 (*)    All other components within normal limits  LACTIC ACID, PLASMA - Abnormal; Notable for the following components:   Lactic Acid, Venous 2.8 (*)    All other components within normal limits  GLUCOSE, CAPILLARY - Abnormal; Notable for the following components:   Glucose-Capillary >600 (*)    All other components within normal limits  OSMOLALITY - Abnormal; Notable for the following components:   Osmolality 341 (*)    All other components within normal limits  BETA-HYDROXYBUTYRIC ACID - Abnormal; Notable for the following components:   Beta-Hydroxybutyric Acid 3.99 (*)    All other components within normal limits  CBC - Abnormal; Notable for the following components:   Hemoglobin 10.3 (*)    HCT 35.3 (*)    MCH 25.6 (*)    MCHC 29.2 (*)    RDW 15.9 (*)    All other components within normal limits  BLOOD GAS, ARTERIAL - Abnormal; Notable for the following components:   pH, Arterial 7.26 (*)    pO2, Arterial 80 (*)    Bicarbonate 14.8 (*)    Acid-base deficit 11.2 (*)    All other components within normal limits  BASIC METABOLIC PANEL - Abnormal; Notable for the following components:   Sodium 120 (*)    Chloride 89 (*)    CO2 16 (*)    Glucose, Bld 1,170 (*)    BUN 40 (*)    Creatinine, Ser 4.14 (*)    Calcium 6.7 (*)    GFR calc non Af Amer 12 (*)    GFR calc Af Amer 14 (*)    All other components within normal limits  PHOSPHORUS - Abnormal; Notable for the following components:   Phosphorus 5.2 (*)    All other components within normal limits  BLOOD GAS, ARTERIAL -  Abnormal; Notable for the following components:    pH, Arterial 7.25 (*)    pO2, Arterial 78 (*)    Bicarbonate 14.5 (*)    Acid-base deficit 11.6 (*)    All other components within normal limits  GLUCOSE, CAPILLARY - Abnormal; Notable for the following components:   Glucose-Capillary >600 (*)    All other components within normal limits  GLUCOSE, CAPILLARY - Abnormal; Notable for the following components:   Glucose-Capillary >600 (*)    All other components within normal limits  GLUCOSE, CAPILLARY - Abnormal; Notable for the following components:   Glucose-Capillary >600 (*)    All other components within normal limits  GLUCOSE, CAPILLARY - Abnormal; Notable for the following components:   Glucose-Capillary >600 (*)    All other components within normal limits  MRSA PCR SCREENING  MAGNESIUM  LACTIC ACID, PLASMA  TROPONIN I  MAGNESIUM  CBC  URINALYSIS, ROUTINE W REFLEX MICROSCOPIC  BASIC METABOLIC PANEL  URINE DRUG SCREEN, QUALITATIVE ([facility] ONLY)  BASIC METABOLIC PANEL   ____________________________________________  EKG  ED ECG REPORT #1 I, Hinda Kehr, the attending physician, personally viewed and interpreted this ECG.  Date: 12/17/2018 EKG Time: 23: 24 Rate: 87 Rhythm: normal sinus rhythm QRS Axis: normal Intervals: normal ST/T Wave abnormalities: Non-specific ST segment / T-wave changes, but no clear evidence of acute ischemia. Narrative Interpretation: no definitive evidence of acute ischemia; does not meet STEMI criteria.   ED ECG REPORT #2 I, Hinda Kehr, the attending physician, personally viewed and interpreted this ECG.  Date: 12/18/2018 EKG Time: 00:15 Rate: 44 Rhythm: sinus bradycardia QRS Axis: normal Intervals: Supraventricular bigeminy ST/T Wave abnormalities: Non-specific ST segment / T-wave changes, but no clear evidence of acute ischemia. Narrative Interpretation: no definitive evidence of acute ischemia; does not meet STEMI criteria.   ED ECG REPORT #3 I, Hinda Kehr, the attending  physician, personally viewed and interpreted this ECG.  Date: 12/18/2018 EKG Time: 2:17 Rate: 174 Rhythm: Sinus tachycardia QRS Axis: RAD Intervals: Prolonged QTC at 523 ms ST/T Wave abnormalities: Non-specific ST segment / T-wave changes, but no clear evidence of acute ischemia. Narrative Interpretation: no definitive evidence of acute ischemia; does not meet STEMI criteria.  Not consistent with SVT nor with A. fib with RVR.      ____________________________________________  RADIOLOGY I, Hinda Kehr, personally viewed and evaluated these images (plain radiographs) as part of my medical decision making, as well as reviewing the written report by the radiologist.  ED MD interpretation: Interstitial edema/pulmonary edema and large right-sided pleural effusion.  Official radiology report(s): Ct Head Wo Contrast  Result Date: 12/18/2018 CLINICAL DATA:  Acute onset of altered mental status EXAM: CT HEAD WITHOUT CONTRAST TECHNIQUE: Contiguous axial images were obtained from the base of the skull through the vertex without intravenous contrast. COMPARISON:  09/07/2018 head CT, MRI 08/11/2018 FINDINGS: Brain: No acute territorial infarction, hemorrhage or intracranial mass. Mild atrophy. Ventricle size is normal. Vascular: No hyperdense vessels.  No unexpected calcification. Skull: Normal. Negative for fracture or focal lesion. Sinuses/Orbits: Mild mucosal thickening in the ethmoid and sphenoid sinus. Other: None IMPRESSION: 1. No CT evidence for acute intracranial abnormality. 2. Mild atrophy Electronically Signed   By: Donavan Foil M.D.   On: 12/18/2018 02:52   Ct Chest Wo Contrast  Result Date: 12/18/2018 CLINICAL DATA:  Altered mental status, abnormal chest x-ray EXAM: CT CHEST WITHOUT CONTRAST TECHNIQUE: Multidetector CT imaging of the chest was performed following the standard protocol without IV contrast. COMPARISON:  12/18/2018  FINDINGS: Cardiovascular: Limited evaluation without  intravenous contrast. Bilateral central venous catheters, right-sided central venous catheter tip terminates at the low right atrium. Aorta is nonaneurysmal. Possible dilatation of the pulmonary trunk up to 3.5 cm. Cardiomegaly. Small pericardial effusion. Mediastinum/Nodes: Midline trachea. Coarse calcification at the left lobe of thyroid. Esophagus within normal limits. Enlarged lymph nodes adjacent to the aortic arch measuring 11 mm. Right low paratracheal lymph node measuring 1 cm. Lungs/Pleura: Large right-sided pleural effusion. Partial consolidations within the right upper, middle and lower lobes. Small left pleural effusion. No focal airspace disease on the left. Mild emphysematous disease at the left apex. Upper Abdomen: No acute abnormality. Musculoskeletal: Diffuse anasarca. Bilateral breast skin thickening. Sclerosis within the upper sternum, possible prior fracture. No acute abnormality. IMPRESSION: 1. Large right-sided pleural effusion with partial consolidations in the right upper, middle and lower lobes which may reflect atelectasis or multifocal pneumonia. There is trace left pleural effusion. 2. Cardiomegaly with small pericardial effusion.  Diffuse anasarca. 3. Possible enlargement of the pulmonary trunk as may be seen with pulmonary hypertension 4. Mild mediastinal adenopathy, possibly reactive Emphysema (ICD10-J43.9). Electronically Signed   By: Donavan Foil M.D.   On: 12/18/2018 03:02   Dg Chest Portable 1 View  Result Date: 12/18/2018 CLINICAL DATA:  Weakness EXAM: PORTABLE CHEST 1 VIEW COMPARISON:  08/14/2018 FINDINGS: Left-sided central venous port tip over the SVC. Right-sided central venous catheter tip over the low right atrium. Large right-sided pleural effusion. Cardiomegaly with vascular congestion and suspected pulmonary edema. No pneumothorax. Clips in the right axilla. There is opacity at the right middle lobe and right base. IMPRESSION: 1. Large right-sided pleural effusion.  2. Cardiomegaly with vascular congestion and suspected mild pulmonary edema. 3. Consolidation at the right middle lobe and right base, atelectasis versus pneumonia. Electronically Signed   By: Donavan Foil M.D.   On: 12/18/2018 00:40    ____________________________________________   PROCEDURES  Critical Care performed: Yes, see critical care procedure note(s)   Procedure(s) performed:   .Critical Care Performed by: Hinda Kehr, MD Authorized by: Hinda Kehr, MD   Critical care provider statement:    Critical care time (minutes):  45   Critical care time was exclusive of:  Separately billable procedures and treating other patients   Critical care was necessary to treat or prevent imminent or life-threatening deterioration of the following conditions:  Metabolic crisis   Critical care was time spent personally by me on the following activities:  Development of treatment plan with patient or surrogate, discussions with consultants, evaluation of patient's response to treatment, examination of patient, obtaining history from patient or surrogate, ordering and performing treatments and interventions, ordering and review of laboratory studies, ordering and review of radiographic studies, pulse oximetry, re-evaluation of patient's condition and review of old charts     ____________________________________________   INITIAL IMPRESSION / [place] / ED COURSE  As part of my medical decision making, I reviewed the following data within the electronic MEDICAL RECORD NUMBER History obtained from family, Nursing notes reviewed and incorporated, Labs reviewed , EKG interpreted , Old chart reviewed, Radiograph reviewed , Discussed with admitting physician  and Notes from prior ED visits    Differential diagnosis includes, but is not limited to, hyperglycemic hyperosmotic state with associated altered mental status concerning for the development of nonketotic coma, [name], acute infection,  intracranial hemorrhage, epileptic seizures.  The patient is obviously ill but is in no distress at this time, just somewhat confused.  Her metabolic panel  is pending but based on the symptoms and the fingerstick blood sugar greater than 600, I suspect she is severely hyperglycemic likely consistent with HHS.  I think this is the cause of her shaking and seizure-like activity; she maintains a normal mental status throughout the episodes and I think that this is tremor/seizure-like activity associated with hypoglycemia.  However I cannot begin treatment because I do not yet know the rest of her metabolic panel including her potassium level.  At this time we do not have an i-STAT available to [place].  I will start IV fluids; although her chest x-ray shows some edema, she needs the fluids given her hyperglycemia and volume contracted state.  She has had no signs of infection and she is able to provide a history that does not indicate any other symptoms such as respiratory distress or cough.  She has no leukocytosis and a normal lactic acid, all of which are reassuring that the abnormality seen on chest x-ray does not represent pneumonia.  She will definitely require admission but additional treatment will need to await the results of her metabolic panel.  I have also added on beta hydroxybutyric acid and serum osmolality.  Clinical Course as of Dec 18 750  Tue Dec 18, 2018  0039 Glucose(!!): 1,259 [CF]  0040 Sodium(!!): 118 [CF]  0040 Sodium correction for hyperglycemia gives [place] a corrected sodium of 146 which is at the upper limit of normal.  Fortunately the patient's potassium is 3.6.  I am starting treatment for [facility] given the severe elevation of glucose and the altered mental status and weakness as well as the neurological symptoms including IV potassium, oral potassium, insulin, and fluids.  She will require ICU admission.   [CF]  5638 I discussed the case by phone with Dr. Posey Pronto with the hospitalist  service.  We discussed the case in detail and he will admit.   [CF]  0114 Ordering insulin per protocol and potassium 10 meq IV x 2 doses as per protocol   [CF]  0209 Beta-Hydroxybutyric Acid(!): 3.99 [CF]  0218 Given the abnormal findings on the chest x-ray, I have ordered a chest CT without contrast for further evaluation.  However, at this time   [CF]    Clinical Course User Index [CF] Hinda Kehr, MD    ____________________________________________  FINAL CLINICAL IMPRESSION(S) / ED DIAGNOSES  Final diagnoses:  Diabetes mellitus with hyperosmolarity without hyperglycemic hyperosmolar nonketotic coma ([place])  Type 2 diabetes mellitus with hyperglycemia, with long-term current use of insulin ([facility])  Generalized weakness  Tremor     MEDICATIONS GIVEN DURING THIS VISIT:  Medications  sodium chloride 0.9 % bolus 1,000 mL (0 mLs Intravenous Stopped 12/18/18 0255)    And  sodium chloride 0.9 % bolus 1,000 mL (1,000 mLs Intravenous Not Given 12/18/18 0258)    And  0.9 %  sodium chloride infusion ( Intravenous Rate/Dose Change 12/18/18 0400)  amLODipine (NORVASC) tablet 10 mg (has no administration in time range)  atorvastatin (LIPITOR) tablet 20 mg (has no administration in time range)  calcitRIOL (ROCALTROL) capsule 0.5 mcg (has no administration in time range)  calcium acetate (PHOSLO) capsule 667 mg (667 mg Oral Not Given 12/18/18 0722)  sodium bicarbonate tablet 650 mg (650 mg Oral Not Given 12/18/18 0300)  levETIRAcetam (KEPPRA) IVPB 500 mg/100 mL premix (has no administration in time range)  heparin injection 5,000 Units (5,000 Units Subcutaneous Given 12/18/18 0616)  LORazepam (ATIVAN) injection 1-2 mg (has no administration in time range)  dextrose  5 %-0.45 % sodium chloride infusion ( Intravenous Hold 12/18/18 0747)  insulin regular bolus via infusion 0-10 Units (0 Units Intravenous Not Given 12/18/18 0722)  insulin regular, human (MYXREDLIN) 100 units/ 100 mL infusion (16.2  Units/hr Intravenous Rate/Dose Change 12/18/18 0702)  dextrose 50 % solution 25 mL (has no administration in time range)  tamoxifen (NOLVADEX) tablet 20 mg (has no administration in time range)  MEDLINE mouth rinse (has no administration in time range)  Chlorhexidine Gluconate Cloth 2 % PADS 6 each (6 each Topical Given 12/18/18 0740)  pentafluoroprop-tetrafluoroeth (GEBAUERS) aerosol 1 application (has no administration in time range)  lidocaine (PF) (XYLOCAINE) 1 % injection 5 mL (has no administration in time range)  lidocaine-prilocaine (EMLA) cream 1 application (has no administration in time range)  0.9 %  sodium chloride infusion (has no administration in time range)  0.9 %  sodium chloride infusion (has no administration in time range)  heparin injection 1,000 Units (has no administration in time range)  alteplase (CATHFLO ACTIVASE) injection 2 mg (has no administration in time range)  levETIRAcetam (KEPPRA) IVPB 1000 mg/100 mL premix (0 mg Intravenous Stopped 12/18/18 0057)  potassium chloride 10 mEq in 100 mL IVPB (0 mEq Intravenous Stopped 12/18/18 0321)     ED Discharge Orders    None       Note:  This document was prepared using Dragon voice recognition software and may include unintentional dictation errors.    Hinda Kehr, MD 12/18/18 [phone]

## 2018-12-18 NOTE — ED Notes (Signed)
2nd EKG obtained and given to Dr. Karma Greaser. pts heart rate is in the lost 50's to 60's.

## 2018-12-18 NOTE — ED Notes (Signed)
Date and time results received: 12/18/18 0040 (use smartphrase ".now" to insert current time)  Test: Glucose Critical Value: 1259  Name of Provider Notified: Karma Greaser  Orders Received? Or Actions Taken?: No new orders at this time.

## 2018-12-18 NOTE — Progress Notes (Addendum)
53 year old African-American female with past medical history significant for end-stage renal disease on hemodialysis, history of breast cancer status post chemotherapy, seizure disorder, hypertension and diabetes presents to hospital secondary to altered mental status. Just admitted this morning by Dr. Posey Pronto for hyperglycemic hyperosmolar nonketotic state.  Remains on insulin drip as still metabolic acidosis present sugars greater than >1000 Also on Tuesday, Thursday and Saturday hemodialysis schedule, will get dialysis done today.  Hyponatremia noted on the labs as well. Agree with current management.  Intensivist will follow while in the ICU Patient seen in the ICU this morning.

## 2018-12-18 NOTE — Progress Notes (Addendum)
Inpatient Diabetes Program Recommendations  AACE/ADA: New Consensus Statement on Inpatient Glycemic Control (2015)  Target Ranges:  Prepandial:   less than 140 mg/dL      Peak postprandial:   less than 180 mg/dL (1-2 hours)      Critically ill patients:  140 - 180 mg/dL   Results for OLIA, HINDERLITER (MRN [id number]) as of 12/18/2018 06:32  Ref. Range 12/17/2018 23:24  Sodium Latest Ref Range: 135 - 145 mmol/L 118 (LL)  Potassium Latest Ref Range: 3.5 - 5.1 mmol/L 3.6  Chloride Latest Ref Range: 98 - 111 mmol/L 85 (L)  CO2 Latest Ref Range: 22 - 32 mmol/L 18 (L)  Glucose Latest Ref Range: 70 - 99 mg/dL 1,259 (HH)  BUN Latest Ref Range: 6 - 20 mg/dL 38 (H)  Creatinine Latest Ref Range: 0.44 - 1.00 mg/dL 4.05 (H)  Calcium Latest Ref Range: 8.9 - 10.3 mg/dL 6.7 (L)  Anion gap Latest Ref Range: 5 - 34  15    Admit with: AMS, [name]  History: DM, ESRD  Home DM Meds: Lantus 18 units Daily       Novolog 4 units TID with meals  Current Orders: IV Insulin Drip     IV Insulin Drip initiated at 2am today.  Per PCP notes, patient had an appointment with [facility] on 11/06/2018, however, I do not see that patient went to that visit.  Per Care Everywhere, patient has an appt with Dr. Honor Junes with [facility] (initial consult) on 12/27/2018.  Patient NEEDS to go to this appt!  Awaiting next BMET level.    MD- Please consider placing order for a current hemoglobin A1c level.  Last one on file was 13.6% back in Sept 2019.     Diabetes Coordinator spoke with patient during last two hospital admission (on 07/09/18 and on 08/13/18).     --Will follow patient during hospitalization--  Wyn Quaker RN, MSN, CDE Diabetes Coordinator Inpatient Glycemic Control Team Team Pager: [phone] (8a-5p)

## 2018-12-18 NOTE — Progress Notes (Signed)
MD- Note next BMET pending.  When patient ready to transition to SQ Insulin, please remember to give pt at least 80% of her home dose of Levemir 15 units at least 1 hour prior to d/c of IV Insulin Drip.  Will also need Novolog SSi at time of transition of drip (when ready)--Recommend Novolog Sensitive SSI Q4 hours  May also consider placing order for a current hemoglobin A1c level.  Last one on file was 13.6% back in Sept 2019.   Met with pt this AM.  Pt was sleepy but able to open her eyes and converse with me.  Was not able to tell me her exact doses of Insulin that she takes at home.  Told me she did not forget to take her insulin.  Explained to pt that her glucose was 1259 mg/dl on admission.  Explained all the treatments we are giving her and the plan to transition to SQ insulin when her labs are better.  Lives by herself at home.  Not sure how well she is caring for herself at home??  Reminded pt that she has an appt with Dr. Honor Junes with [facility] on 12/27/2018.  Strongly encouraged pt to go to this appt.     Per PCP notes, patient had an appointment with [facility] team on 11/06/2018, however, I do not see that patient went to that visit.  Per Care Everywhere, patient has an appt with Dr. Honor Junes with [facility] (initial consult) on 12/27/2018.  Patient NEEDS to go to this appt!  Diabetes Coordinator spoke with patient during last two hospital admission (on 07/09/18 and on 08/13/18).     --Will follow patient during hospitalization--  Wyn Quaker RN, MSN, CDE Diabetes Coordinator Inpatient Glycemic Control Team Team Pager: [phone] (8a-5p)

## 2018-12-18 NOTE — Progress Notes (Signed)
HD initiated via R Chest hd catheter. Dressing changed and biopatch applied. Patient twitching. Able to answer some simple questions, difficult to understand. Vitals stable. Orders received from Dr. Juleen China. UF goal 3L. Will monitor closely.

## 2018-12-18 NOTE — ED Notes (Signed)
Report was called to the floor at this time.

## 2018-12-18 NOTE — H&P (Signed)
[facility] [place] NAME: Alyssa Floyd    MR#:  [id number]  DATE OF BIRTH:  07/03/1966  DATE OF ADMISSION:  12/17/2018  PRIMARY CARE PHYSICIAN: Hemming, Claudette Laws, MD   REQUESTING/REFERRING PHYSICIAN: Dr. Karma Greaser  CHIEF COMPLAINT:   Chief Complaint  Patient presents with  . Weakness    HISTORY OF PRESENT ILLNESS:  Alyssa Floyd  is a 53 y.o. female with a known history listed below presented to emergency room for evaluation of generalized weakness and confusion for last 3 days.  Patient has some confusion and not able to provide detailed history.  All the history is discussed with patient's family at bedside and ER physician.  As per family, patient is very weak and tired appearing for last 2 to 3 days.  Patient also has confusion and not making sense.  Today patient had seizure-like activity and brought to ER for further evaluation.  As per family patient was taking her medications.  Denies fever or chills.  Denies nausea or vomiting.  No chest pain or abdominal pain.  Denies shortness of breath.  In emergency room patient has initial evaluation that showed elevated blood sugar of more than 1200.  Patient was started on insulin drip.  Patient also received IV Keppra for seizure-like activity.  Hospitalist team requested for admission.  PAST MEDICAL HISTORY:   Past Medical History:  Diagnosis Date  . Breast cancer ([place])   . 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 Diabetes mellitus ([place])   . Diabetic nephropathy ([place])   . Hyperlipidemia   . Hypertension   . Nephrotic syndrome    diabetic nephropathy, biopsy on 01/06/16 at [facility]  . Normocytic anemia   . Seizures ([place])   . Subclinical hypothyroidism   . Tobacco use   . Vitamin D deficiency     PAST SURGICAL HISTORY:   Past Surgical History:  Procedure Laterality Date  . BREAST SURGERY    . PARTIAL HYSTERECTOMY      SOCIAL HISTORY:   Social History    Tobacco Use  . Smoking status: Current Every Day Smoker    Packs/day: 0.50    Types: Cigarettes  . Smokeless tobacco: Never Used  Substance Use Topics  . Alcohol use: No    FAMILY HISTORY:   Family History  Problem Relation Age of Onset  . Diabetes Unknown     DRUG ALLERGIES:   Allergies  Allergen Reactions  . Penicillins Other (See Comments)    Pt reports hallucinations when taking penicillins. >Has patient had a PCN reaction causing immediate rash, facial/tongue/throat swelling, SOB or lightheadedness with hypotension: No Has patient had a PCN reaction causing severe rash involving mucus membranes or skin necrosis: No Has patient had a PCN reaction that required hospitalization No Has patient had a PCN reaction occurring within the last 10 years: No If all of the above answers are "NO", then may proceed with Cephalosporin use.    REVIEW OF SYSTEMS:   ROS -unable to obtain completely secondary to confusion.  MEDICATIONS AT HOME:   Prior to Admission medications   Medication Sig Start Date End Date Taking? Authorizing Provider  amLODipine (NORVASC) 10 MG tablet Take 1 tablet by mouth daily. 09/19/18  Yes [provider]  atorvastatin (LIPITOR) 20 MG tablet Take 1 tablet by mouth daily. 08/31/18  Yes [provider]  calcitRIOL (ROCALTROL) 0.5 MCG capsule Take 1 capsule by mouth daily. 10/05/18  Yes [provider]  calcium acetate (PHOSLO) 667 MG capsule Take 1 capsule (667 mg total) by mouth 3 (three) times daily with meals. 09/07/18  Yes Earleen Newport, MD  Cholecalciferol (VITAMIN D-1000 MAX ST) 25 MCG (1000 UT) tablet Take 2,000 Units by mouth daily. 09/19/18  Yes [provider]  ferrous sulfate 325 (65 FE) MG EC tablet Take 1 tablet by mouth daily. 10/17/18 10/17/19 Yes [provider]  insulin aspart (NOVOLOG) 100 UNIT/ML injection Inject 4 Units into the skin 3 (three) times daily with meals. Please take  fingersticks reading more than 200 07/11/18  Yes Elgergawy, Silver Huguenin, MD  insulin glargine (LANTUS) 100 UNIT/ML injection Inject 0.18 mLs (18 Units total) into the skin daily. 08/17/18  Yes Mody, Ulice Bold, MD  levETIRAcetam (KEPPRA) 250 MG tablet Take 250 mg by mouth 2 (two) times daily. 09/19/18  Yes [provider]  potassium chloride (K-DUR,KLOR-CON) 10 MEQ tablet Take 1 tablet by mouth 2 (two) times daily. 10/05/18  Yes [provider]  sodium bicarbonate 650 MG tablet Take 1 tablet (650 mg total) by mouth 2 (two) times daily. 08/16/18  Yes Mody, Sital, MD  SUMAtriptan (IMITREX) 50 MG tablet TK 1 T PO ONCE PRF MIGRAINE. MAY TK SECOND DOSE AFTER 2 H IF NEEDED. TK AT NIGHT. 09/19/18  Yes [provider]  tamoxifen (NOLVADEX) 20 MG tablet Take 20 mg by mouth daily.   Yes [provider]      VITAL SIGNS:  Blood pressure (!) 138/110, pulse 92, temperature 97.7 F (36.5 C), temperature source Oral, resp. rate (!) 23, height 5\' 4"  (1.626 m), weight 69.9 kg, SpO2 97 %.  PHYSICAL EXAMINATION:  Physical Exam  GENERAL:  52 y.o.-year-old patient lying in the bed with mild distress.  EYES: Pupils equal, round, reactive to light and accommodation. No scleral icterus. Extraocular muscles intact.  HEENT: Head atraumatic, normocephalic. Oropharynx and nasopharynx clear.  NECK:  Supple, no jugular venous distention. No thyroid enlargement, no tenderness.  LUNGS: Clear air entry, decrease on bases,  few wheezing heard CARDIOVASCULAR: S1, S2 normal. No murmurs, rubs, or gallops.  ABDOMEN: Soft, nontender, nondistended. Bowel sounds present. No organomegaly or mass.  EXTREMITIES: 1+ edema, no cyanosis, or clubbing.  NEUROLOGIC: unable to do complete evaluation secondary to confusion, Able to move all extremities, restless  PSYCHIATRIC: Awake and cooperative.  SKIN: warm, dry  LABORATORY PANEL:   CBC Recent Labs  Lab 12/17/18 2324  WBC 7.8  HGB 9.2*  HCT 31.7*  PLT  235   ------------------------------------------------------------------------------------------------------------------  Chemistries  Recent Labs  Lab 12/17/18 2324  NA 118*  K 3.6  CL 85*  CO2 18*  GLUCOSE 1,259*  BUN 38*  CREATININE 4.05*  CALCIUM 6.7*  MG 1.8  AST 13*  ALT 12  ALKPHOS 124  BILITOT 1.2   ------------------------------------------------------------------------------------------------------------------  Cardiac Enzymes Recent Labs  Lab 12/17/18 2324  TROPONINI <0.03   ------------------------------------------------------------------------------------------------------------------  RADIOLOGY:  Dg Chest Portable 1 View  Result Date: 12/18/2018 CLINICAL DATA:  Weakness EXAM: PORTABLE CHEST 1 VIEW COMPARISON:  08/14/2018 FINDINGS: Left-sided central venous port tip over the SVC. Right-sided central venous catheter tip over the low right atrium. Large right-sided pleural effusion. Cardiomegaly with vascular congestion and suspected pulmonary edema. No pneumothorax. Clips in the right axilla. There is opacity at the right middle lobe and right base. IMPRESSION: 1. Large right-sided pleural effusion. 2. Cardiomegaly with vascular congestion and suspected mild pulmonary edema. 3. Consolidation at the right middle lobe and  right base, atelectasis versus pneumonia. Electronically Signed   By: Donavan Foil M.D.   On: 12/18/2018 00:40      IMPRESSION AND PLAN:   1. [name] in a patient with known history of diabetes: Patient with previous admissions for [name].  Patient started on insulin drip.  Follow [name] protocol.  BMP every 4 hours.  Monitor CBG every hour.  2. Hyponatremia secondary to diabetic ketoacidosis: Corrected sodium is 135.  Continue to monitor BMP.  3. ESRD ON HD.  Patient with vascular congestion on x-ray chest.  Will avoid IV fluid.  Nephrology consult requested for dialysis in a.m.  4. Hypertension.  Continue Norvasc.  5. Anemia of chronic  disease.  Stable.  Monitor  6.  Seizure-like activity in a patient with known history of seizure disorder: Could be related to hyperglycemia.  1 dose of IV Keppra given in emergency room.  Will obtain CT head to rule out acute process.  Continue home dose of Keppra.  7.  Chronic other medical problems: Continue home medications as ordered.  DVT prophylaxis: Heparin subcutaneous  I have discussed with E-link intensivist Dr. Madalyn Rob.   CODE STATUS: Full code  TOTAL CRITICAL TIME TAKING CARE OF THIS PATIENT: 45 minutes.    All the records are reviewed and case discussed with ED provider. Management plans discussed with the patient, family and they are in agreement.   Sedalia Muta M.D on 12/18/2018 at 2:27 AM  Between 7am to 6pm - Pager - [phone]  After 6pm go to [email or web address] - [employer] [facility]  Office  [phone]  CC: Primary care physician; Hemming, Claudette Laws, MD

## 2018-12-19 ENCOUNTER — Inpatient Hospital Stay

## 2018-12-19 DIAGNOSIS — G40901 Epilepsy, unspecified, not intractable, with status epilepticus: Secondary | ICD-10-CM

## 2018-12-19 DIAGNOSIS — E1110 Type 2 diabetes mellitus with ketoacidosis without coma: Secondary | ICD-10-CM

## 2018-12-19 LAB — BASIC METABOLIC PANEL
Anion gap: 7 (ref 5–15)
Anion gap: 7 (ref 5–15)
BUN: 17 mg/dL (ref 6–20)
BUN: 17 mg/dL (ref 6–20)
CHLORIDE: 101 mmol/L (ref 98–111)
CO2: 25 mmol/L (ref 22–32)
CO2: 24 mmol/L (ref 22–32)
Calcium: 6.9 mg/dL — ABNORMAL LOW (ref 8.9–10.3)
Calcium: 7 mg/dL — ABNORMAL LOW (ref 8.9–10.3)
Chloride: 101 mmol/L (ref 98–111)
Creatinine, Ser: 2.82 mg/dL — ABNORMAL HIGH (ref 0.44–1.00)
Creatinine, Ser: 2.77 mg/dL — ABNORMAL HIGH (ref 0.44–1.00)
GFR calc Af Amer: 22 mL/min — ABNORMAL LOW (ref >60)
GFR calc Af Amer: 22 mL/min — ABNORMAL LOW (ref >60)
GFR calc non Af Amer: 19 mL/min — ABNORMAL LOW (ref >60)
GFR calc non Af Amer: 19 mL/min — ABNORMAL LOW (ref >60)
Glucose, Bld: 151 mg/dL — ABNORMAL HIGH (ref 70–99)
Glucose, Bld: 267 mg/dL — ABNORMAL HIGH (ref 70–99)
Potassium: 3.1 mmol/L — ABNORMAL LOW (ref 3.5–5.1)
Potassium: 3.4 mmol/L — ABNORMAL LOW (ref 3.5–5.1)
Sodium: 133 mmol/L — ABNORMAL LOW (ref 135–145)
Sodium: 132 mmol/L — ABNORMAL LOW (ref 135–145)

## 2018-12-19 LAB — GLUCOSE, CAPILLARY
Glucose-Capillary: 163 mg/dL — ABNORMAL HIGH (ref 70–99)
Glucose-Capillary: 170 mg/dL — ABNORMAL HIGH (ref 70–99)
Glucose-Capillary: 269 mg/dL — ABNORMAL HIGH (ref 70–99)
Glucose-Capillary: 241 mg/dL — ABNORMAL HIGH (ref 70–99)
Glucose-Capillary: 198 mg/dL — ABNORMAL HIGH (ref 70–99)
Glucose-Capillary: 156 mg/dL — ABNORMAL HIGH (ref 70–99)

## 2018-12-19 LAB — BODY FLUID CELL COUNT WITH DIFFERENTIAL
Eos, Fluid: 1 %
LYMPHS FL: 9 %
Monocyte-Macrophage-Serous Fluid: 13 %
Neutrophil Count, Fluid: 77 %
Total Nucleated Cell Count, Fluid: 2294 cu mm

## 2018-12-19 LAB — PROTEIN, PLEURAL OR PERITONEAL FLUID: Total protein, fluid: <3 g/dL

## 2018-12-19 LAB — LACTATE DEHYDROGENASE, PLEURAL OR PERITONEAL FLUID: LD, Fluid: 113 U/L — ABNORMAL HIGH (ref 3–23)

## 2018-12-19 MED ORDER — LOPERAMIDE HCL 2 MG PO CAPS
2.0000 mg | ORAL_CAPSULE | ORAL | Status: DC | PRN
  Administered 2018-12-19: 2 mg via ORAL
  Filled 2018-12-19: qty 1

## 2018-12-19 MED ORDER — BENZONATATE 100 MG PO CAPS
100.0000 mg | ORAL_CAPSULE | Freq: Three times a day (TID) | ORAL | Status: DC | PRN
  Administered 2018-12-19 – 2018-12-20 (×3): 100 mg via ORAL
  Filled 2018-12-19 (×4): qty 1

## 2018-12-19 MED ORDER — POTASSIUM CHLORIDE CRYS ER 20 MEQ PO TBCR
20.0000 meq | EXTENDED_RELEASE_TABLET | Freq: Once | ORAL | Status: AC
  Administered 2018-12-19: 20 meq via ORAL
  Filled 2018-12-19: qty 1

## 2018-12-19 NOTE — Plan of Care (Signed)
Pt was alert and oriented throughout shift.  Vital signs within normal limits. Pt taken for thoracentesis procedure, 2 liters removed. After procedure pt had a dry cough. Hinton Dyer NP notified and ordered Tessalon. Pt had watery stool and requested Imodium which was ordered by Hinton Dyer. Pt awaiting bed on med-surg floor.

## 2018-12-19 NOTE — Progress Notes (Addendum)
Inpatient Diabetes Program Recommendations  AACE/ADA: New Consensus Statement on Inpatient Glycemic Control (2015)  Target Ranges:  Prepandial:   less than 140 mg/dL      Peak postprandial:   less than 180 mg/dL (1-2 hours)      Critically ill patients:  140 - 180 mg/dL   Lab Results  Component Value Date   GLUCAP 241 (H) 12/19/2018   HGBA1C 13.6 (H) 08/11/2018   Review of Glycemic ControlResults for LANDREY, MAHURIN (MRN [id number]) as of 12/19/2018 14:26  Ref. Range 12/19/2018 03:22 12/19/2018 07:32 12/19/2018 11:43 12/19/2018 13:07  Glucose-Capillary Latest Ref Range: 70 - 99 mg/dL 163 (H) 170 (H) 269 (H) 241 (H)    Admit with: AMS, [name]  History: DM, ESRD  Home DM Meds: Lantus 14 units Daily                             Novolog 4 units TID with meals Current orders for Inpatient glycemic control:  Novolog 1-3 units q 4 hours, Levemir 15 units daily  Inpatient Diabetes Program Recommendations:    Note patient is on PO diet.  -Consider d/c of ICU glycemic control order set -Add custom Novolog correction -151-200 mg/dL- 1 unit, 201-250 mg/dL- 2 units, 251-300 mg/dL- 3 units, 301-350 mg/dL-4 units, and 351-400 mg/dL- 5 units, > 401 mg/dL- call MD and give 6 units- tid with meals  -Add Novolog meal coverage 3 units tid with meals (hold if patient eats less than 50%).  -Consider rechecking A1C.    Spoke with patient regarding home DM management. She states that she is taking her insulin as ordered.  She endorses increased stress recently which seems to increase her blood sugars. She states that on a good day, her blood sugars range from 150-160's.  Also she states that she has adequate supplies, testing strips etc.  She see's endocrinologist at [facility].  Note per past notes that blood sugars are labile.    Thanks,  Adah Perl, RN, BC-ADM Inpatient Diabetes Coordinator Pager [phone] (8a-5p)

## 2018-12-19 NOTE — Procedures (Signed)
Interventional Radiology Procedure Note  Procedure: US guided right thoracentesis  Complications: None  Estimated Blood Loss: None  Findings: 2 L of yellow fluid removed from right pleural space. Post CXR pending.  Venetia Night. Kathlene Cote, M.D Pager:  [phone]

## 2018-12-19 NOTE — Progress Notes (Signed)
[facility] at [place] NAME: Alyssa Floyd    MR#:  [id number]  DATE OF BIRTH:  Mar 31, 1966  SUBJECTIVE:   Patient states she is feeling much better this morning. She is happy to be off the insulin drip. No concerns this morning.  REVIEW OF SYSTEMS:  Review of Systems  Constitutional: Negative for chills and fever.  HENT: Negative for congestion and sore throat.   Eyes: Negative for blurred vision and double vision.  Respiratory: Negative for cough and shortness of breath.   Cardiovascular: Negative for chest pain and leg swelling.  Gastrointestinal: Negative for abdominal pain, nausea and vomiting.  Genitourinary: Negative for dysuria and urgency.  Musculoskeletal: Negative for back pain and neck pain.  Neurological: Negative for dizziness and headaches.  Psychiatric/Behavioral: Negative for depression. The patient is not nervous/anxious.     DRUG ALLERGIES:   Allergies  Allergen Reactions  . Penicillins Other (See Comments)    Pt reports hallucinations when taking penicillins. >Has patient had a PCN reaction causing immediate rash, facial/tongue/throat swelling, SOB or lightheadedness with hypotension: No Has patient had a PCN reaction causing severe rash involving mucus membranes or skin necrosis: No Has patient had a PCN reaction that required hospitalization No Has patient had a PCN reaction occurring within the last 10 years: No If all of the above answers are "NO", then may proceed with Cephalosporin use.   VITALS:  Blood pressure (!) 142/91, pulse 85, temperature 98.5 F (36.9 C), temperature source Oral, resp. rate 17, height 5\' 5"  (1.651 m), weight 76.3 kg, SpO2 99 %. PHYSICAL EXAMINATION:  Physical Exam  GENERAL:  53 y.o.-year-old patient lying in the bed with mild distress.  EYES: Pupils equal, round, reactive to light and accommodation. No scleral icterus. Extraocular muscles intact.  HEENT: Head atraumatic,  normocephalic. Oropharynx and nasopharynx clear.  NECK:  Supple, no jugular venous distention. No thyroid enlargement, no tenderness.  LUNGS: CTAB, normal work of breathing CARDIOVASCULAR: RRR, S1, S2 normal. No murmurs, rubs, or gallops.  ABDOMEN: Soft, nontender, nondistended. Bowel sounds present. No organomegaly or mass.  EXTREMITIES: 1+ edema, no cyanosis, or clubbing.  NEUROLOGIC: unable to do complete evaluation secondary to confusion, Able to move all extremities, restless  PSYCHIATRIC: Awake and cooperative.  SKIN: warm, dry LABORATORY PANEL:  Female CBC Recent Labs  Lab 12/18/18 0305  WBC 8.3  HGB 10.3*  HCT 35.3*  PLT 242   ------------------------------------------------------------------------------------------------------------------ Chemistries  Recent Labs  Lab 12/17/18 2324 12/18/18 0305  12/19/18 1007  NA 118* 120*   < > 132*  K 3.6 3.7   < > 3.4*  CL 85* 89*   < > 101  CO2 18* 16*   < > 24  GLUCOSE 1,259* 1,170*   < > 267*  BUN 38* 40*   < > 17  CREATININE 4.05* 4.14*   < > 2.77*  CALCIUM 6.7* 6.7*   < > 7.0*  MG 1.8 1.9  --   --   AST 13*  --   --   --   ALT 12  --   --   --   ALKPHOS 124  --   --   --   BILITOT 1.2  --   --   --    < > = values in this interval not displayed.   RADIOLOGY:  Dg Chest 1 View  Result Date: 12/19/2018 CLINICAL DATA:  Post right thoracentesis. EXAM: CHEST  1 VIEW COMPARISON:  December 19, 2018 FINDINGS: Stable right central line and left Port-A-Cath. The right effusion is smaller but persists with underlying atelectasis. No pneumothorax. Mild cardiomegaly persists. The hila and mediastinum are unchanged. No other changes. IMPRESSION: The right pleural effusion persists but is smaller in the interval after thoracentesis with no pneumothorax. No other change. Electronically Signed   By: Dorise Bullion III M.D   On: 12/19/2018 12:50   Dg Chest Port 1 View  Result Date: 12/19/2018 CLINICAL DATA:  Acute respiratory failure  EXAM: PORTABLE CHEST 1 VIEW COMPARISON:  12/18/2018 FINDINGS: Cardiac shadow remains enlarged. Dialysis catheter and left-sided chest wall port are again seen and stable. Large right-sided pleural effusion is again seen and stable. Left lung remains clear. IMPRESSION: Stable large right-sided pleural effusion. Electronically Signed   By: Inez Catalina M.D.   On: 12/19/2018 07:11   US Thoracentesis Asp Pleural Space W/img Guide  Result Date: 12/19/2018 CLINICAL DATA:  Large right pleural effusion. EXAM: ULTRASOUND GUIDED RIGHT THORACENTESIS COMPARISON:  Chest x-ray on 12/19/2018 PROCEDURE: An ultrasound guided thoracentesis was thoroughly discussed with the patient and questions answered. The benefits, risks, alternatives and complications were also discussed. The patient understands and wishes to proceed with the procedure. Written consent was obtained. Ultrasound was performed to localize and mark an adequate pocket of fluid in the right chest. The area was then prepped and draped in the normal sterile fashion. 1% Lidocaine was used for local anesthesia. Under ultrasound guidance a 19 6 French Safe-T-Centesis catheter was introduced. Thoracentesis was performed. The catheter was removed and a dressing applied. COMPLICATIONS: None FINDINGS: A total of approximately 2 L of clear, yellow fluid was removed. A fluid sample was sent for laboratory analysis. Postprocedural ultrasound shows no significant pleural fluid remaining. IMPRESSION: Successful ultrasound guided right thoracentesis yielding 2 L of pleural fluid. Electronically Signed   By: Aletta Edouard M.D.   On: 12/19/2018 13:14   ASSESSMENT AND PLAN:   1. [name] in type 2 diabetes: [name] has resolved. Off insulin drip since yesterday.  Now on subcutaneous insulin.  2. Hypokalemia: K 3.4.  Replete and recheck.  3. ESRD ON HD TTS: Nephrology following.  4. Hypertension. Continue Norvasc.  5. Anemia of chronic disease. Stable.  Monitor  6.   Seizure-like activity in a patient with known history of seizure disorder: Could be related to hyperglycemia.  CT head negative.  EEG with generalized nonspecific cerebral dysfunction.  Keppra dose changed per neurology.  All the records are reviewed and case discussed with Care Management/Social Worker. Management plans discussed with the patient, family and they are in agreement.  CODE STATUS: Full Code  TOTAL TIME TAKING CARE OF THIS PATIENT: 35 minutes.   More than 50% of the time was spent in counseling/coordination of care: YES  POSSIBLE D/C IN 2-3 DAYS, DEPENDING ON CLINICAL CONDITION.   Berna Spare Tatem Fesler M.D on 12/19/2018 at 2:59 PM  Between 7am to 6pm - Pager [phone]  After 6pm go to [email or web address] - Proofreader  [employer] [facility]  Office  [phone]  CC: Primary care physician; Hemming, Claudette Laws, MD  Note: This dictation was prepared with Dragon dictation along with smaller phrase technology. Any transcriptional errors that result from this process are unintentional.

## 2018-12-19 NOTE — Progress Notes (Signed)
[facility]  ROUNDING NOTE   Subjective:   Emergent hemodialysis treatment yesterday. UF of 2.5 liters.   Alert and oriented this morning.   D5 infusion  Objective:  Vital signs in last 24 hours:  Temp:  [97.7 F (36.5 C)-98.4 F (36.9 C)] 98.4 F (36.9 C) (01/22 0753) Pulse Rate:  [76-89] 82 (01/22 0600) Resp:  [15-31] 19 (01/22 0600) BP: (122-163)/(68-102) 151/87 (01/22 0952) SpO2:  [92 %-100 %] 94 % (01/22 0600) Weight:  [76.1 kg-76.3 kg] 76.3 kg (01/22 0338)  Weight change: 8.746 kg Filed Weights   12/18/18 0745 12/18/18 1150 12/19/18 0338  Weight: 78.6 kg 76.1 kg 76.3 kg    Intake/Output: I/O last 3 completed shifts: In: 3825.7 [I.V.:1100.7; IV Piggyback:2725] Out: 2600 [Urine:100; Other:2500]   Intake/Output this shift:  Total I/O In: 120 [P.O.:120] Out: -   Physical Exam: General: Critically ill  Head: Normocephalic, atraumatic. Moist oral mucosal membranes  Eyes: Anicteric, PERRL  Neck: Supple, trachea midline  Lungs:  Clear to auscultation   Heart: Regular rate and rhythm  Abdomen:  Soft, nontender, obese  Extremities: no peripheral edema.  Neurologic: Nonfocal, moving all four extremities  Skin: No lesions  Access: RIJ permcath    Basic Metabolic Panel: Recent Labs  Lab 12/17/18 2324 12/18/18 0305 12/18/18 1314 12/18/18 1625 12/18/18 2116 12/19/18 0519 12/19/18 1007  NA 118* 120* 135 134* 134* 133* 132*  K 3.6 3.7 2.9* 3.1* 3.2* 3.1* 3.4*  CL 85* 89* 101 99 100 101 101  CO2 18* 16* 26 26 26 25 24   GLUCOSE 1,259* 1,170* 141* 93 118* 151* 267*  BUN 38* 40* 17 17 17 17 17   CREATININE 4.05* 4.14* 2.16* 2.36* 2.45* 2.82* 2.77*  CALCIUM 6.7* 6.7* 7.3* 7.2* 7.3* 6.9* 7.0*  MG 1.8 1.9  --   --   --   --   --   PHOS  --  5.2*  --   --   --   --   --     Liver Function Tests: Recent Labs  Lab 12/17/18 2324  AST 13*  ALT 12  ALKPHOS 124  BILITOT 1.2  PROT 5.9*  ALBUMIN 2.0*   No results for input(s): LIPASE, AMYLASE in  the last 168 hours. No results for input(s): AMMONIA in the last 168 hours.  CBC: Recent Labs  Lab 12/17/18 2324 12/18/18 0305  WBC 7.8 8.3  NEUTROABS 6.6  --   HGB 9.2* 10.3*  HCT 31.7* 35.3*  MCV 89.3 87.8  PLT 235 242    Cardiac Enzymes: Recent Labs  Lab 12/17/18 2324  TROPONINI <0.03    BNP: Invalid input(s): POCBNP  CBG: Recent Labs  Lab 12/18/18 1609 12/18/18 1938 12/18/18 2318 12/19/18 0322 12/19/18 0732  GLUCAP 62 92 134* 163* 170*    Microbiology: Results for orders placed or performed during the hospital encounter of 12/17/18  MRSA PCR Screening     Status: None   Collection Time: 12/18/18  3:09 AM  Result Value Ref Range Status   MRSA by PCR NEGATIVE NEGATIVE Final    Comment:        The GeneXpert MRSA Assay (FDA approved for NASAL specimens only), is one component of a comprehensive MRSA colonization surveillance program. It is not intended to diagnose MRSA infection nor to guide or monitor treatment for MRSA infections. Performed at [facility], [address] [place],  [address]     Coagulation Studies: No results for input(s): LABPROT, INR in the  last 72 hours.  Urinalysis: No results for input(s): COLORURINE, LABSPEC, PHURINE, GLUCOSEU, HGBUR, BILIRUBINUR, KETONESUR, PROTEINUR, UROBILINOGEN, NITRITE, LEUKOCYTESUR in the last 72 hours.  Invalid input(s): APPERANCEUR    Imaging: Ct Head Wo Contrast  Result Date: 12/18/2018 CLINICAL DATA:  Acute onset of altered mental status EXAM: CT HEAD WITHOUT CONTRAST TECHNIQUE: Contiguous axial images were obtained from the base of the skull through the vertex without intravenous contrast. COMPARISON:  09/07/2018 head CT, MRI 08/11/2018 FINDINGS: Brain: No acute territorial infarction, hemorrhage or intracranial mass. Mild atrophy. Ventricle size is normal. Vascular: No hyperdense vessels.  No unexpected calcification. Skull: Normal. Negative for fracture or focal lesion.  Sinuses/Orbits: Mild mucosal thickening in the ethmoid and sphenoid sinus. Other: None IMPRESSION: 1. No CT evidence for acute intracranial abnormality. 2. Mild atrophy Electronically Signed   By: Donavan Foil M.D.   On: 12/18/2018 02:52   Ct Chest Wo Contrast  Result Date: 12/18/2018 CLINICAL DATA:  Altered mental status, abnormal chest x-ray EXAM: CT CHEST WITHOUT CONTRAST TECHNIQUE: Multidetector CT imaging of the chest was performed following the standard protocol without IV contrast. COMPARISON:  12/18/2018 FINDINGS: Cardiovascular: Limited evaluation without intravenous contrast. Bilateral central venous catheters, right-sided central venous catheter tip terminates at the low right atrium. Aorta is nonaneurysmal. Possible dilatation of the pulmonary trunk up to 3.5 cm. Cardiomegaly. Small pericardial effusion. Mediastinum/Nodes: Midline trachea. Coarse calcification at the left lobe of thyroid. Esophagus within normal limits. Enlarged lymph nodes adjacent to the aortic arch measuring 11 mm. Right low paratracheal lymph node measuring 1 cm. Lungs/Pleura: Large right-sided pleural effusion. Partial consolidations within the right upper, middle and lower lobes. Small left pleural effusion. No focal airspace disease on the left. Mild emphysematous disease at the left apex. Upper Abdomen: No acute abnormality. Musculoskeletal: Diffuse anasarca. Bilateral breast skin thickening. Sclerosis within the upper sternum, possible prior fracture. No acute abnormality. IMPRESSION: 1. Large right-sided pleural effusion with partial consolidations in the right upper, middle and lower lobes which may reflect atelectasis or multifocal pneumonia. There is trace left pleural effusion. 2. Cardiomegaly with small pericardial effusion.  Diffuse anasarca. 3. Possible enlargement of the pulmonary trunk as may be seen with pulmonary hypertension 4. Mild mediastinal adenopathy, possibly reactive Emphysema (ICD10-J43.9).  Electronically Signed   By: Donavan Foil M.D.   On: 12/18/2018 03:02   Dg Chest Port 1 View  Result Date: 12/19/2018 CLINICAL DATA:  Acute respiratory failure EXAM: PORTABLE CHEST 1 VIEW COMPARISON:  12/18/2018 FINDINGS: Cardiac shadow remains enlarged. Dialysis catheter and left-sided chest wall port are again seen and stable. Large right-sided pleural effusion is again seen and stable. Left lung remains clear. IMPRESSION: Stable large right-sided pleural effusion. Electronically Signed   By: Inez Catalina M.D.   On: 12/19/2018 07:11   Dg Chest Portable 1 View  Result Date: 12/18/2018 CLINICAL DATA:  Weakness EXAM: PORTABLE CHEST 1 VIEW COMPARISON:  08/14/2018 FINDINGS: Left-sided central venous port tip over the SVC. Right-sided central venous catheter tip over the low right atrium. Large right-sided pleural effusion. Cardiomegaly with vascular congestion and suspected pulmonary edema. No pneumothorax. Clips in the right axilla. There is opacity at the right middle lobe and right base. IMPRESSION: 1. Large right-sided pleural effusion. 2. Cardiomegaly with vascular congestion and suspected mild pulmonary edema. 3. Consolidation at the right middle lobe and right base, atelectasis versus pneumonia. Electronically Signed   By: Donavan Foil M.D.   On: 12/18/2018 00:40     Medications:   . sodium chloride    .  sodium chloride    . levETIRAcetam    . levETIRAcetam 500 mg (12/19/18 1001)   . amLODipine  10 mg Oral Daily  . atorvastatin  20 mg Oral Daily  . calcitRIOL  0.5 mcg Oral Daily  . calcium acetate  667 mg Oral TID WC  . Chlorhexidine Gluconate Cloth  6 each Topical Q0600  . heparin  5,000 Units Subcutaneous Q8H  . insulin aspart  1-3 Units Subcutaneous Q4H  . insulin detemir  15 Units Subcutaneous Daily  . mouth rinse  15 mL Mouth Rinse BID  . sodium bicarbonate  650 mg Oral BID  . tamoxifen  20 mg Oral Daily   sodium chloride, sodium chloride, alteplase, heparin, levETIRAcetam,  lidocaine (PF), lidocaine-prilocaine, LORazepam, pentafluoroprop-tetrafluoroeth  Assessment/ Plan:  Ms. Alyssa Floyd is a 53 y.o. black female with diabetes mellitus type II insulin dependent, hyperlipidemia, hypertension, seizure disorder  [facility] TTS [place] 73.5kg RIJ permcath  1. End Stage Renal Disease: on hemodialysis TTS Emergent hemodialysis treatment for pulmonary edema on admission.  Evaluate daily for dialysis need.  Dialysis scheduled for tomorrow.   2. Hyponatremia: secondary to volume overload. Na 140 bath. Improved to 132 today  - serial sodium checks.   3. Hypertension: blood pressure at goal.   4. Metabolic acidosis: Diabetic ketoacidosis Secondary to uncontrolled diabetes mellitus type II with chronic kidney disease.  -insulin and D5 infusion  5. Anemia with chronic kidney disease: hemoglobin 10.3 - Mircera as outpatient.   6. Secondary Hyperparathyroidism: corrected calcium at goal.  - restart calcium acetate .   7. Seizure disorder: on levetiracetam.  - Will need a post dialysis dose.  - Appreciate Neurology input.   LOS: 1 Alyssa Floyd 1/22/202011:22 AM

## 2018-12-19 NOTE — Progress Notes (Signed)
Subjective: Patient awake and alert this morning. No reports of seizures or seizure like activity reported. Her jerking movements and twitching has resolved.  Objective: Current vital signs: BP (!) 143/70   Pulse 82   Temp 98.4 F (36.9 C) (Axillary)   Resp 19   Ht 5\' 5"  (1.651 m)   Wt 76.3 kg   SpO2 94%   BMI 27.99 kg/m  Vital signs in last 24 hours: Temp:  [97.7 F (36.5 C)-98.4 F (36.9 C)] 98.4 F (36.9 C) (01/22 0753) Pulse Rate:  [71-89] 82 (01/22 0600) Resp:  [15-31] 19 (01/22 0600) BP: (113-163)/(68-102) 143/70 (01/22 0600) SpO2:  [92 %-100 %] 94 % (01/22 0600) Weight:  [76.1 kg-76.3 kg] 76.3 kg (01/22 0338)  Intake/Output from previous day: 01/21 0701 - 01/22 0700 In: 3423.2 [I.V.:903.2; IV Piggyback:2520] Out: 2600 [Urine:100] Intake/Output this shift: No intake/output data recorded. Nutritional status:  Diet Order            Diet renal/carb modified with fluid restriction Diet-HS Snack? Nothing; Fluid restriction: 1200 mL Fluid; Room service appropriate? Yes; Fluid consistency: Thin  Diet effective now             Neurological Exam  Mental Status: Alert, oriented, thought content appropriate. Speech fluent without evidence of aphasia. Able to follow 3 step commands without difficulty. Attention span and concentration seemed appropriate  Cranial Nerves: II: Discs flat bilaterally; Visual fields grossly normal, pupils equal, round, reactive to light and accommodation III,IV, VI: mild left ptosis present, extra-ocular motions intact bilaterally V,VII: smile symmetric, facial light touch sensationintact VIII: hearing normal bilaterally IX,X: gag reflex present XI: bilateral shoulder shrug XII: midline tongue extension Motor: Right :Upper extremity 5/5Without pronator driftLeft: Upper extremity 5/5 without pronator drift Right:Lower extremity 5/5Left: Lower extremity 5/5 Tone and  bulk:normal tone throughout; no atrophy noted Myoclonic facial movements and right arm jerking movement noted. Sensory: Pinprick and light touchintact bilaterally Deep Tendon Reflexes: 1+ and symmetric with absent ankle jerks bilaterally Plantars: Right:muteLeft: mute Cerebellar: Finger-to-nosetesting intact bilaterally.Heel to shin testing normal bilaterally Gait: not tested due to safety concerns  Lab Results: Basic Metabolic Panel: Recent Labs  Lab 12/17/18 2324 12/18/18 0305 12/18/18 1314 12/18/18 1625 12/18/18 2116 12/19/18 0519  NA 118* 120* 135 134* 134* 133*  K 3.6 3.7 2.9* 3.1* 3.2* 3.1*  CL 85* 89* 101 99 100 101  CO2 18* 16* 26 26 26 25   GLUCOSE 1,259* 1,170* 141* 93 118* 151*  BUN 38* 40* 17 17 17 17   CREATININE 4.05* 4.14* 2.16* 2.36* 2.45* 2.82*  CALCIUM 6.7* 6.7* 7.3* 7.2* 7.3* 6.9*  MG 1.8 1.9  --   --   --   --   PHOS  --  5.2*  --   --   --   --     Liver Function Tests: Recent Labs  Lab 12/17/18 2324  AST 13*  ALT 12  ALKPHOS 124  BILITOT 1.2  PROT 5.9*  ALBUMIN 2.0*   No results for input(s): LIPASE, AMYLASE in the last 168 hours. No results for input(s): AMMONIA in the last 168 hours.  CBC: Recent Labs  Lab 12/17/18 2324 12/18/18 0305  WBC 7.8 8.3  NEUTROABS 6.6  --   HGB 9.2* 10.3*  HCT 31.7* 35.3*  MCV 89.3 87.8  PLT 235 242    Cardiac Enzymes: Recent Labs  Lab 12/17/18 2324  TROPONINI <0.03    Lipid Panel: No results for input(s): CHOL, TRIG, HDL, CHOLHDL, VLDL, LDLCALC in the last  168 hours.  CBG: Recent Labs  Lab 12/18/18 1609 12/18/18 1938 12/18/18 2318 12/19/18 0322 12/19/18 0732  GLUCAP 73 92 134* 163* 170*    Microbiology: Results for orders placed or performed during the hospital encounter of 12/17/18  MRSA PCR Screening     Status: None   Collection Time: 12/18/18  3:09 AM  Result Value Ref Range Status   MRSA by PCR NEGATIVE NEGATIVE Final    Comment:        The  GeneXpert MRSA Assay (FDA approved for NASAL specimens only), is one component of a comprehensive MRSA colonization surveillance program. It is not intended to diagnose MRSA infection nor to guide or monitor treatment for MRSA infections. Performed at [facility], [address] [place], [place] [address]     Coagulation Studies: No results for input(s): LABPROT, INR in the last 72 hours.  Imaging: Ct Head Wo Contrast  Result Date: 12/18/2018 CLINICAL DATA:  Acute onset of altered mental status EXAM: CT HEAD WITHOUT CONTRAST TECHNIQUE: Contiguous axial images were obtained from the base of the skull through the vertex without intravenous contrast. COMPARISON:  09/07/2018 head CT, MRI 08/11/2018 FINDINGS: Brain: No acute territorial infarction, hemorrhage or intracranial mass. Mild atrophy. Ventricle size is normal. Vascular: No hyperdense vessels.  No unexpected calcification. Skull: Normal. Negative for fracture or focal lesion. Sinuses/Orbits: Mild mucosal thickening in the ethmoid and sphenoid sinus. Other: None IMPRESSION: 1. No CT evidence for acute intracranial abnormality. 2. Mild atrophy Electronically Signed   By: Donavan Foil M.D.   On: 12/18/2018 02:52   Ct Chest Wo Contrast  Result Date: 12/18/2018 CLINICAL DATA:  Altered mental status, abnormal chest x-ray EXAM: CT CHEST WITHOUT CONTRAST TECHNIQUE: Multidetector CT imaging of the chest was performed following the standard protocol without IV contrast. COMPARISON:  12/18/2018 FINDINGS: Cardiovascular: Limited evaluation without intravenous contrast. Bilateral central venous catheters, right-sided central venous catheter tip terminates at the low right atrium. Aorta is nonaneurysmal. Possible dilatation of the pulmonary trunk up to 3.5 cm. Cardiomegaly. Small pericardial effusion. Mediastinum/Nodes: Midline trachea. Coarse calcification at the left lobe of thyroid. Esophagus within normal limits. Enlarged lymph nodes  adjacent to the aortic arch measuring 11 mm. Right low paratracheal lymph node measuring 1 cm. Lungs/Pleura: Large right-sided pleural effusion. Partial consolidations within the right upper, middle and lower lobes. Small left pleural effusion. No focal airspace disease on the left. Mild emphysematous disease at the left apex. Upper Abdomen: No acute abnormality. Musculoskeletal: Diffuse anasarca. Bilateral breast skin thickening. Sclerosis within the upper sternum, possible prior fracture. No acute abnormality. IMPRESSION: 1. Large right-sided pleural effusion with partial consolidations in the right upper, middle and lower lobes which may reflect atelectasis or multifocal pneumonia. There is trace left pleural effusion. 2. Cardiomegaly with small pericardial effusion.  Diffuse anasarca. 3. Possible enlargement of the pulmonary trunk as may be seen with pulmonary hypertension 4. Mild mediastinal adenopathy, possibly reactive Emphysema (ICD10-J43.9). Electronically Signed   By: Donavan Foil M.D.   On: 12/18/2018 03:02   Dg Chest Port 1 View  Result Date: 12/19/2018 CLINICAL DATA:  Acute respiratory failure EXAM: PORTABLE CHEST 1 VIEW COMPARISON:  12/18/2018 FINDINGS: Cardiac shadow remains enlarged. Dialysis catheter and left-sided chest wall port are again seen and stable. Large right-sided pleural effusion is again seen and stable. Left lung remains clear. IMPRESSION: Stable large right-sided pleural effusion. Electronically Signed   By: Inez Catalina M.D.   On: 12/19/2018 07:11   Dg Chest Portable 1 View  Result Date: 12/18/2018 CLINICAL DATA:  Weakness EXAM: PORTABLE CHEST 1 VIEW COMPARISON:  08/14/2018 FINDINGS: Left-sided central venous port tip over the SVC. Right-sided central venous catheter tip over the low right atrium. Large right-sided pleural effusion. Cardiomegaly with vascular congestion and suspected pulmonary edema. No pneumothorax. Clips in the right axilla. There is opacity at the right  middle lobe and right base. IMPRESSION: 1. Large right-sided pleural effusion. 2. Cardiomegaly with vascular congestion and suspected mild pulmonary edema. 3. Consolidation at the right middle lobe and right base, atelectasis versus pneumonia. Electronically Signed   By: Donavan Foil M.D.   On: 12/18/2018 00:40    Medications:  I have reviewed the patient's current medications. Prior to Admission:  Medications Prior to Admission  Medication Sig Dispense Refill Last Dose  . amLODipine (NORVASC) 10 MG tablet Take 1 tablet by mouth daily.   Past Week at Unknown time  . atorvastatin (LIPITOR) 20 MG tablet Take 1 tablet by mouth daily.   Past Week at Unknown time  . calcitRIOL (ROCALTROL) 0.5 MCG capsule Take 1 capsule by mouth daily.   Past Week at Unknown time  . calcium acetate (PHOSLO) 667 MG capsule Take 1 capsule (667 mg total) by mouth 3 (three) times daily with meals. 90 capsule 1 Past Week at Unknown time  . Cholecalciferol (VITAMIN D-1000 MAX ST) 25 MCG (1000 UT) tablet Take 2,000 Units by mouth daily.   Past Week at Unknown time  . ferrous sulfate 325 (65 FE) MG EC tablet Take 1 tablet by mouth daily.   Past Week at Unknown time  . insulin aspart (NOVOLOG) 100 UNIT/ML injection Inject 4 Units into the skin 3 (three) times daily with meals. Please take fingersticks reading more than 200 10 mL 0 Past Week at Unknown time  . insulin glargine (LANTUS) 100 UNIT/ML injection Inject 0.18 mLs (18 Units total) into the skin daily. 10 mL 11 Past Week at Unknown time  . levETIRAcetam (KEPPRA) 250 MG tablet Take 250 mg by mouth 2 (two) times daily.   Past Week at Unknown time  . potassium chloride (K-DUR,KLOR-CON) 10 MEQ tablet Take 1 tablet by mouth 2 (two) times daily.   Past Week at Unknown time  . sodium bicarbonate 650 MG tablet Take 1 tablet (650 mg total) by mouth 2 (two) times daily. 120 tablet 0 Past Week at Unknown time  . SUMAtriptan (IMITREX) 50 MG tablet TK 1 T PO ONCE PRF MIGRAINE. MAY TK  SECOND DOSE AFTER 2 H IF NEEDED. TK AT NIGHT.   prn at prn  . tamoxifen (NOLVADEX) 20 MG tablet Take 20 mg by mouth daily.   Past Week at Unknown time   Scheduled: . amLODipine  10 mg Oral Daily  . atorvastatin  20 mg Oral Daily  . calcitRIOL  0.5 mcg Oral Daily  . calcium acetate  667 mg Oral TID WC  . Chlorhexidine Gluconate Cloth  6 each Topical Q0600  . heparin  5,000 Units Subcutaneous Q8H  . insulin aspart  1-3 Units Subcutaneous Q4H  . insulin detemir  15 Units Subcutaneous Daily  . mouth rinse  15 mL Mouth Rinse BID  . sodium bicarbonate  650 mg Oral BID  . tamoxifen  20 mg Oral Daily   Assessment: 53 year old female presenting with chief complaints of generalized weakness, altered mental status and seizure-like activity.  Found to be in [name] requiring insulin gtt. Concerns for simple partial seizures in the setting of metabolic derangements. Mental status now  back to baseline. No further twitching-like activity noted. EEG  consistent with a generalized nonspecific cerebral dysfunction (encephalopathy) with no epileptiform activity noted.  Recommendations 1. Continue Keppra 1000 mg daily po with an additional 500 mg after each dialysis. 2. Agree with continuing current medical management of underlying medical condition  This patient was staffed with Dr. Addison Lank, Leonel Ramsay who personally evaluated patient, reviewed documentation and agreed with assessment and plan of care as above.  Rufina Falco, DNP, FNP-BC Board certified Nurse Practitioner Neurology Department    LOS: 1 day    12/19/2018  9:19 AM   I have seen and evaluated the patient. I have reviewed the above note.  She is greatly improved. 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 I suspect that her seizure threshold was lower due to her multiple metabolic derangements. She reports that she was taking keppra 500mg  BID at home. I would favor 1g daily with additional  500mg  after dialysis.   At this time, no other recommendations. Please call with further quesitons or concerns.   Roland Rack, MD [facility] [phone]  If 7pm- 7am, please page neurology on call as listed in [place].

## 2018-12-19 NOTE — Progress Notes (Signed)
Name: Alyssa Floyd MRN: [id number] DOB: 1966/02/01    ADMISSION DATE:  12/17/2018  BRIEF PATIENT DESCRIPTION:  53 yo female admitted with acute encephalopathy, possible seizure activity, and HHNK vs. [name] requiring insulin gtt incidental finding of right sided thoracentesis on CT Chest   SIGNIFICANT EVENTS/STUDIES:  01/21-Pt admitted to stepdown unit on insulin gtt  01/21-CT Head revealed no CT evidence for acute intracranial abnormality. Mild atrophy 01/21-CT Chest revealed Large right-sided pleural effusion with partial consolidations in the right upper, middle and lower lobes which may reflect atelectasis or multifocal pneumonia. There is trace left pleural effusion. Cardiomegaly with small pericardial effusion.  Diffuse anasarca. Possible enlargement of the pulmonary trunk as may be seen with pulmonary hypertension. Mild mediastinal adenopathy, possibly reactive Emphysema  SUBJECTIVE:  No complaints at this time she states she feels better   VITAL SIGNS: Temp:  [97.7 F (36.5 C)-98.4 F (36.9 C)] 98.4 F (36.9 C) (01/22 0753) Pulse Rate:  [76-89] 82 (01/22 0600) Resp:  [15-31] 19 (01/22 0600) BP: (122-163)/(68-102) 151/87 (01/22 0952) SpO2:  [92 %-100 %] 94 % (01/22 0600) Weight:  [76.1 kg-76.3 kg] 76.3 kg (01/22 0338)  PHYSICAL EXAMINATION: General: well developed, well nourished, NAD  Neuro: alert and oriented, follows commands, BUE and BLE motor strength 5/5, PERRLA HEENT: supple, no JVD  Cardiovascular: nsr, rrr, no R/G Lungs: diminished throughout right and clear all other lobes, even, non labored  Abdomen: +BS x4, soft, non tender, non distended  Musculoskeletal: normal bulk and tone, 1+ bilateral extremity edema   Skin: intact no rashes or lesions present   Recent Labs  Lab 12/18/18 2116 12/19/18 0519 12/19/18 1007  NA 134* 133* 132*  K 3.2* 3.1* 3.4*  CL 100 101 101  CO2 26 25 24   BUN 17 17 17   CREATININE 2.45* 2.82* 2.77*  GLUCOSE 118* 151* 267*    Recent Labs  Lab 12/17/18 2324 12/18/18 0305  HGB 9.2* 10.3*  HCT 31.7* 35.3*  WBC 7.8 8.3  PLT 235 242   Ct Head Wo Contrast  Result Date: 12/18/2018 CLINICAL DATA:  Acute onset of altered mental status EXAM: CT HEAD WITHOUT CONTRAST TECHNIQUE: Contiguous axial images were obtained from the base of the skull through the vertex without intravenous contrast. COMPARISON:  09/07/2018 head CT, MRI 08/11/2018 FINDINGS: Brain: No acute territorial infarction, hemorrhage or intracranial mass. Mild atrophy. Ventricle size is normal. Vascular: No hyperdense vessels.  No unexpected calcification. Skull: Normal. Negative for fracture or focal lesion. Sinuses/Orbits: Mild mucosal thickening in the ethmoid and sphenoid sinus. Other: None IMPRESSION: 1. No CT evidence for acute intracranial abnormality. 2. Mild atrophy Electronically Signed   By: Donavan Foil M.D.   On: 12/18/2018 02:52   Ct Chest Wo Contrast  Result Date: 12/18/2018 CLINICAL DATA:  Altered mental status, abnormal chest x-ray EXAM: CT CHEST WITHOUT CONTRAST TECHNIQUE: Multidetector CT imaging of the chest was performed following the standard protocol without IV contrast. COMPARISON:  12/18/2018 FINDINGS: Cardiovascular: Limited evaluation without intravenous contrast. Bilateral central venous catheters, right-sided central venous catheter tip terminates at the low right atrium. Aorta is nonaneurysmal. Possible dilatation of the pulmonary trunk up to 3.5 cm. Cardiomegaly. Small pericardial effusion. Mediastinum/Nodes: Midline trachea. Coarse calcification at the left lobe of thyroid. Esophagus within normal limits. Enlarged lymph nodes adjacent to the aortic arch measuring 11 mm. Right low paratracheal lymph node measuring 1 cm. Lungs/Pleura: Large right-sided pleural effusion. Partial consolidations within the right upper, middle and lower lobes. Small left pleural effusion. No  focal airspace disease on the left. Mild emphysematous disease at  the left apex. Upper Abdomen: No acute abnormality. Musculoskeletal: Diffuse anasarca. Bilateral breast skin thickening. Sclerosis within the upper sternum, possible prior fracture. No acute abnormality. IMPRESSION: 1. Large right-sided pleural effusion with partial consolidations in the right upper, middle and lower lobes which may reflect atelectasis or multifocal pneumonia. There is trace left pleural effusion. 2. Cardiomegaly with small pericardial effusion.  Diffuse anasarca. 3. Possible enlargement of the pulmonary trunk as may be seen with pulmonary hypertension 4. Mild mediastinal adenopathy, possibly reactive Emphysema (ICD10-J43.9). Electronically Signed   By: Donavan Foil M.D.   On: 12/18/2018 03:02   Dg Chest Port 1 View  Result Date: 12/19/2018 CLINICAL DATA:  Acute respiratory failure EXAM: PORTABLE CHEST 1 VIEW COMPARISON:  12/18/2018 FINDINGS: Cardiac shadow remains enlarged. Dialysis catheter and left-sided chest wall port are again seen and stable. Large right-sided pleural effusion is again seen and stable. Left lung remains clear. IMPRESSION: Stable large right-sided pleural effusion. Electronically Signed   By: Inez Catalina M.D.   On: 12/19/2018 07:11   Dg Chest Portable 1 View  Result Date: 12/18/2018 CLINICAL DATA:  Weakness EXAM: PORTABLE CHEST 1 VIEW COMPARISON:  08/14/2018 FINDINGS: Left-sided central venous port tip over the SVC. Right-sided central venous catheter tip over the low right atrium. Large right-sided pleural effusion. Cardiomegaly with vascular congestion and suspected pulmonary edema. No pneumothorax. Clips in the right axilla. There is opacity at the right middle lobe and right base. IMPRESSION: 1. Large right-sided pleural effusion. 2. Cardiomegaly with vascular congestion and suspected mild pulmonary edema. 3. Consolidation at the right middle lobe and right base, atelectasis versus pneumonia. Electronically Signed   By: Donavan Foil M.D.   On: 12/18/2018 00:40     ASSESSMENT / PLAN:  Right pleural effusion and vascular congestion on CXR 12/18/2018 Supplemental O2 for dyspnea and/or hypoxia  Right sided thoracentesis pending   Hyperosmolar Hyperglycemic Non-Ketoacidosis vs. [name]-resolved  Hyponatremia secondary to dehydration-improving   Continue insulin gtt until 4 consecutive CBG's <180 CBG's ac/hs  SSI and levemir   ESRD on Hemodialysis  Hyponatremia secondary to dehydration-resolved  Continuous telemetry monitoring Trend BMP  Replace electrolytes as indicated  Monitor UOP  Continue outpatient sodium bicarbonate  Nephrology consulted appreciate input-HD per recommendations   Anemia without obvious acute blood loss   VTE px: subq heparin  Trend CBC Monitor for s/sx of bleeding and transfuse for hgb <7  Mild acute encephalopathy-resolved  Questionable seizure activity  Hx: Seizures  Continue iv keppra  Prn ativan for seizure activity  Neurology consulted appreciate input-continue keppra dosing per recommendations    -Pt stable for transfer to medsurg unit with telemetry  Marda Stalker, [place] Pager [phone] (please enter 7 digits) PCCM Consult Pager [phone] (please enter 7 digits)

## 2018-12-20 ENCOUNTER — Other Ambulatory Visit: Payer: Self-pay | Admitting: Internal Medicine

## 2018-12-20 LAB — CBC WITH DIFFERENTIAL/PLATELET
Abs Immature Granulocytes: 0.03 10*3/uL (ref 0.00–0.07)
Basophils Absolute: 0 10*3/uL (ref 0.0–0.1)
Basophils Relative: 1 %
EOS ABS: 0.3 10*3/uL (ref 0.0–0.5)
Eosinophils Relative: 4 %
HCT: 27.2 % — ABNORMAL LOW (ref 36.0–46.0)
Hemoglobin: 8.4 g/dL — ABNORMAL LOW (ref 12.0–15.0)
Immature Granulocytes: 0 %
Lymphocytes Relative: 12 %
Lymphs Abs: 0.8 10*3/uL (ref 0.7–4.0)
MCH: 25.8 pg — ABNORMAL LOW (ref 26.0–34.0)
MCHC: 30.9 g/dL (ref 30.0–36.0)
MCV: 83.7 fL (ref 80.0–100.0)
Monocytes Absolute: 0.7 10*3/uL (ref 0.1–1.0)
Monocytes Relative: 11 %
Neutro Abs: 5 10*3/uL (ref 1.7–7.7)
Neutrophils Relative %: 72 %
Platelets: 162 10*3/uL (ref 150–400)
RBC: 3.25 MIL/uL — ABNORMAL LOW (ref 3.87–5.11)
RDW: 15.9 % — AB (ref 11.5–15.5)
WBC: 6.8 10*3/uL (ref 4.0–10.5)
nRBC: 0 % (ref 0.0–0.2)

## 2018-12-20 LAB — HEMOGLOBIN A1C
Hgb A1c MFr Bld: 11.9 % — ABNORMAL HIGH (ref 4.8–5.6)
Mean Plasma Glucose: 294.83 mg/dL

## 2018-12-20 LAB — BASIC METABOLIC PANEL
Anion gap: 8 (ref 5–15)
BUN: 22 mg/dL — ABNORMAL HIGH (ref 6–20)
CO2: 23 mmol/L (ref 22–32)
Calcium: 6.8 mg/dL — ABNORMAL LOW (ref 8.9–10.3)
Chloride: 101 mmol/L (ref 98–111)
Creatinine, Ser: 3.16 mg/dL — ABNORMAL HIGH (ref 0.44–1.00)
GFR calc Af Amer: 19 mL/min — ABNORMAL LOW (ref >60)
GFR calc non Af Amer: 16 mL/min — ABNORMAL LOW (ref >60)
Glucose, Bld: 309 mg/dL — ABNORMAL HIGH (ref 70–99)
Potassium: 3.4 mmol/L — ABNORMAL LOW (ref 3.5–5.1)
Sodium: 132 mmol/L — ABNORMAL LOW (ref 135–145)

## 2018-12-20 LAB — PROTEIN, BODY FLUID (OTHER): Total Protein, Body Fluid Other: 2.9 g/dL

## 2018-12-20 LAB — GLUCOSE, CAPILLARY
GLUCOSE-CAPILLARY: 339 mg/dL — AB (ref 70–99)
Glucose-Capillary: 306 mg/dL — ABNORMAL HIGH (ref 70–99)
Glucose-Capillary: 244 mg/dL — ABNORMAL HIGH (ref 70–99)

## 2018-12-20 LAB — CYTOLOGY - NON PAP

## 2018-12-20 LAB — HEPATITIS B SURFACE ANTIGEN: Hepatitis B Surface Ag: NEGATIVE

## 2018-12-20 MED ORDER — BENZONATATE 200 MG PO CAPS: 200.0000 mg | ORAL_CAPSULE | Freq: Three times a day (TID) | ORAL | 0 refills | Status: DC | PRN

## 2018-12-20 MED ORDER — LEVOFLOXACIN 500 MG PO TABS: 750.0000 mg | ORAL_TABLET | Freq: Every day | ORAL | Status: DC

## 2018-12-20 MED ORDER — LEVETIRACETAM 1000 MG PO TABS: 1000.0000 mg | ORAL_TABLET | Freq: Every day | ORAL | 0 refills | Status: DC

## 2018-12-20 MED ORDER — INSULIN ASPART 100 UNIT/ML ~~LOC~~ SOLN
0.0000 [IU] | Freq: Three times a day (TID) | SUBCUTANEOUS | Status: DC
  Administered 2018-12-20: 3 [IU] via SUBCUTANEOUS
  Administered 2018-12-20: 4 [IU] via SUBCUTANEOUS
  Filled 2018-12-20: qty 1

## 2018-12-20 MED ORDER — INSULIN GLARGINE 100 UNIT/ML ~~LOC~~ SOLN: 15.0000 [IU] | Freq: Every day | SUBCUTANEOUS | 11 refills | Status: DC

## 2018-12-20 MED ORDER — RAMELTEON 8 MG PO TABS
8.0000 mg | ORAL_TABLET | Freq: Every day | ORAL | Status: DC
  Administered 2018-12-20: 8 mg via ORAL
  Filled 2018-12-20 (×2): qty 1

## 2018-12-20 MED ORDER — INSULIN GLARGINE 100 UNIT/ML ~~LOC~~ SOLN
15.0000 [IU] | Freq: Every day | SUBCUTANEOUS | Status: DC
  Administered 2018-12-20: 15 [IU] via SUBCUTANEOUS
  Filled 2018-12-20 (×3): qty 0.15

## 2018-12-20 MED ORDER — INSULIN ASPART 100 UNIT/ML ~~LOC~~ SOLN
3.0000 [IU] | Freq: Three times a day (TID) | SUBCUTANEOUS | Status: DC
  Administered 2018-12-20: 3 [IU] via SUBCUTANEOUS
  Filled 2018-12-20: qty 0.03
  Filled 2018-12-20: qty 1

## 2018-12-20 MED ORDER — INSULIN DETEMIR 100 UNIT/ML ~~LOC~~ SOLN
15.0000 [IU] | Freq: Every day | SUBCUTANEOUS | Status: DC
  Filled 2018-12-20 (×2): qty 0.15

## 2018-12-20 MED ORDER — INSULIN ASPART 100 UNIT/ML ~~LOC~~ SOLN
0.0000 [IU] | Freq: Every day | SUBCUTANEOUS | Status: DC
  Filled 2018-12-20: qty 1

## 2018-12-20 MED ORDER — LEVETIRACETAM 500 MG PO TABS: ORAL_TABLET | ORAL | 0 refills | Status: DC

## 2018-12-20 MED ORDER — ONDANSETRON HCL 4 MG PO TABS
4.0000 mg | ORAL_TABLET | Freq: Once | ORAL | Status: AC
  Administered 2018-12-20: 4 mg via ORAL
  Filled 2018-12-20: qty 1

## 2018-12-20 MED ORDER — CEFDINIR 300 MG PO CAPS: ORAL_CAPSULE | ORAL | 0 refills | Status: DC

## 2018-12-20 NOTE — Progress Notes (Signed)
MD notified of CBG of 306 and pt's request for sleep aide. New order received for sliding scale and slip aide

## 2018-12-20 NOTE — Progress Notes (Signed)
HD initiated via R Chest hd catheter without issue. No heparin treatment. UF goal 2.5L. No acute events reported overnight. Patient stable for dialysis.

## 2018-12-20 NOTE — Progress Notes (Signed)
Pre hd 

## 2018-12-20 NOTE — Progress Notes (Signed)
Patient not in room, getting hemodialysis. Unable to assess patient. Will evaluate when patient is back in the room.

## 2018-12-20 NOTE — Discharge Instructions (Signed)
It was so nice to meet you during this hospitalization!  You came into the hospital because you were weak and confused. We found that you were in [name]. We treated you with an IV insulin drip and your blood sugars improved.  Please take the following insulin regimen when you get home: Lantus 15 units daily and Novolog 4 units three times a day with meals.  You were seen by the neurologist, who changed the dose of your Keppra. Please take 1000mg  daily and take an additional 500mg  after each dialysis session.  You had a CT chest that showed a possible pneumonia in your right lung. I have prescribed an antibiotic called Cefdinir. You should take 1 tablet today, then one tablet Saturday after dialysis, then 1 tablet on Monday.  Take care, Dr. Brett Albino

## 2018-12-20 NOTE — Progress Notes (Signed)
[facility]  ROUNDING NOTE   Subjective:   Seen and examined on hemodialysis. Tolerating treatment well. UF goal of 3 liters.     HEMODIALYSIS FLOWSHEET:  Blood Flow Rate (mL/min): 400 mL/min Arterial Pressure (mmHg): -120 mmHg Venous Pressure (mmHg): 120 mmHg Transmembrane Pressure (mmHg): 40 mmHg Ultrafiltration Rate (mL/min): 860 mL/min Dialysate Flow Rate (mL/min): 600 ml/min Conductivity: Machine : 14.1 Conductivity: Machine : 14.1 Dialysis Fluid Bolus: Normal Saline Bolus Amount (mL): 100 mL Dialysate Change: 4K    Objective:  Vital signs in last 24 hours:  Temp:  [98.2 F (36.8 C)-99.1 F (37.3 C)] 98.6 F (37 C) (01/23 0650) Pulse Rate:  [80-97] 80 (01/23 0815) Resp:  [10-23] 12 (01/23 0815) BP: (142-166)/(67-109) 149/85 (01/23 0815) SpO2:  [89 %-99 %] 93 % (01/23 0815) Weight:  [76.8 kg-77 kg] 77 kg (01/23 0650)  Weight change: -1.772 kg Filed Weights   12/19/18 0338 12/20/18 0500 12/20/18 0650  Weight: 76.3 kg 76.8 kg 77 kg    Intake/Output: I/O last 3 completed shifts: In: 795 [P.O.:120; I.V.:675] Out: 300 [Urine:300]   Intake/Output this shift:  No intake/output data recorded.  Physical Exam: General: Laying in bed  Head: Normocephalic, atraumatic. Moist oral mucosal membranes  Eyes: Anicteric, PERRL  Neck: Supple, trachea midline  Lungs:  Clear to auscultation   Heart: Regular rate and rhythm  Abdomen:  Soft, nontender, obese  Extremities: no peripheral edema.  Neurologic: Nonfocal, moving all four extremities  Skin: No lesions  Access: RIJ permcath    Basic Metabolic Panel: Recent Labs  Lab 12/17/18 2324 12/18/18 0305  12/18/18 1625 12/18/18 2116 12/19/18 0519 12/19/18 1007 12/20/18 0355  NA 118* 120*   < > 134* 134* 133* 132* 132*  K 3.6 3.7   < > 3.1* 3.2* 3.1* 3.4* 3.4*  CL 85* 89*   < > 99 100 101 101 101  CO2 18* 16*   < > 26 26 25 24 23   GLUCOSE 1,259* 1,170*   < > 93 118* 151* 267* 309*  BUN 38* 40*   < >  17 17 17 17  22*  CREATININE 4.05* 4.14*   < > 2.36* 2.45* 2.82* 2.77* 3.16*  CALCIUM 6.7* 6.7*   < > 7.2* 7.3* 6.9* 7.0* 6.8*  MG 1.8 1.9  --   --   --   --   --   --   PHOS  --  5.2*  --   --   --   --   --   --    < > = values in this interval not displayed.    Liver Function Tests: Recent Labs  Lab 12/17/18 2324  AST 13*  ALT 12  ALKPHOS 124  BILITOT 1.2  PROT 5.9*  ALBUMIN 2.0*   No results for input(s): LIPASE, AMYLASE in the last 168 hours. No results for input(s): AMMONIA in the last 168 hours.  CBC: Recent Labs  Lab 12/17/18 2324 12/18/18 0305 12/20/18 0355  WBC 7.8 8.3 6.8  NEUTROABS 6.6  --  5.0  HGB 9.2* 10.3* 8.4*  HCT 31.7* 35.3* 27.2*  MCV 89.3 87.8 83.7  PLT 235 242 162    Cardiac Enzymes: Recent Labs  Lab 12/17/18 2324  TROPONINI <0.03    BNP: Invalid input(s): POCBNP  CBG: Recent Labs  Lab 12/19/18 1307 12/19/18 1551 12/19/18 1923 12/20/18 0102 12/20/18 0226  GLUCAP 241* 198* 156* 306* 339*    Microbiology: Results for orders placed or performed during the hospital  encounter of 12/17/18  MRSA PCR Screening     Status: None   Collection Time: 12/18/18  3:09 AM  Result Value Ref Range Status   MRSA by PCR NEGATIVE NEGATIVE Final    Comment:        The GeneXpert MRSA Assay (FDA approved for NASAL specimens only), is one component of a comprehensive MRSA colonization surveillance program. It is not intended to diagnose MRSA infection nor to guide or monitor treatment for MRSA infections. Performed at [facility], [address] [place], [place] [address]   Body fluid culture     Status: None (Preliminary result)   Collection Time: 12/19/18 12:20 PM  Result Value Ref Range Status   Specimen Description   Final    PLEURAL Performed at [facility], [address] [place], [place] [address]    Special Requests   Final    NONE Performed at [facility], [address] [place], [place]  [address]    Gram Stain   Final    WBC PRESENT,BOTH PMN AND MONONUCLEAR NO ORGANISMS SEEN CYTOSPIN SMEAR Performed at [facility], [place] [address], [place], [place] [address]    Culture PENDING  Incomplete   Report Status PENDING  Incomplete    Coagulation Studies: No results for input(s): LABPROT, INR in the last 72 hours.  Urinalysis: No results for input(s): COLORURINE, LABSPEC, PHURINE, GLUCOSEU, HGBUR, BILIRUBINUR, KETONESUR, PROTEINUR, UROBILINOGEN, NITRITE, LEUKOCYTESUR in the last 72 hours.  Invalid input(s): APPERANCEUR    Imaging: Dg Chest 1 View  Result Date: 12/19/2018 CLINICAL DATA:  Post right thoracentesis. EXAM: CHEST  1 VIEW COMPARISON:  December 19, 2018 FINDINGS: Stable right central line and left Port-A-Cath. The right effusion is smaller but persists with underlying atelectasis. No pneumothorax. Mild cardiomegaly persists. The hila and mediastinum are unchanged. No other changes. IMPRESSION: The right pleural effusion persists but is smaller in the interval after thoracentesis with no pneumothorax. No other change. Electronically Signed   By: Dorise Bullion III M.D   On: 12/19/2018 12:50   Dg Chest Port 1 View  Result Date: 12/19/2018 CLINICAL DATA:  Acute respiratory failure EXAM: PORTABLE CHEST 1 VIEW COMPARISON:  12/18/2018 FINDINGS: Cardiac shadow remains enlarged. Dialysis catheter and left-sided chest wall port are again seen and stable. Large right-sided pleural effusion is again seen and stable. Left lung remains clear. IMPRESSION: Stable large right-sided pleural effusion. Electronically Signed   By: Inez Catalina M.D.   On: 12/19/2018 07:11   US Thoracentesis Asp Pleural Space W/img Guide  Result Date: 12/19/2018 CLINICAL DATA:  Large right pleural effusion. EXAM: ULTRASOUND GUIDED RIGHT THORACENTESIS COMPARISON:  Chest x-ray on 12/19/2018 PROCEDURE: An ultrasound guided thoracentesis was thoroughly discussed with the patient and questions answered. The  benefits, risks, alternatives and complications were also discussed. The patient understands and wishes to proceed with the procedure. Written consent was obtained. Ultrasound was performed to localize and mark an adequate pocket of fluid in the right chest. The area was then prepped and draped in the normal sterile fashion. 1% Lidocaine was used for local anesthesia. Under ultrasound guidance a 19 6 French Safe-T-Centesis catheter was introduced. Thoracentesis was performed. The catheter was removed and a dressing applied. COMPLICATIONS: None FINDINGS: A total of approximately 2 L of clear, yellow fluid was removed. A fluid sample was sent for laboratory analysis. Postprocedural ultrasound shows no significant pleural fluid remaining. IMPRESSION: Successful ultrasound guided right thoracentesis yielding 2 L of pleural fluid. Electronically Signed  By: Aletta Edouard M.D.   On: 12/19/2018 13:14     Medications:   . sodium chloride    . sodium chloride    . levETIRAcetam    . levETIRAcetam 500 mg (12/19/18 1001)   . amLODipine  10 mg Oral Daily  . atorvastatin  20 mg Oral Daily  . calcitRIOL  0.5 mcg Oral Daily  . calcium acetate  667 mg Oral TID WC  . Chlorhexidine Gluconate Cloth  6 each Topical Q0600  . heparin  5,000 Units Subcutaneous Q8H  . insulin aspart  0-5 Units Subcutaneous QHS  . insulin aspart  0-9 Units Subcutaneous TID WC  . insulin aspart  3 Units Subcutaneous TID WC  . mouth rinse  15 mL Mouth Rinse BID  . ramelteon  8 mg Oral QHS  . sodium bicarbonate  650 mg Oral BID  . tamoxifen  20 mg Oral Daily   sodium chloride, sodium chloride, alteplase, benzonatate, heparin, levETIRAcetam, lidocaine (PF), lidocaine-prilocaine, loperamide, LORazepam, pentafluoroprop-tetrafluoroeth  Assessment/ Plan:  Ms. Alyssa Floyd is a 53 y.o. black female with diabetes mellitus type II insulin dependent, hyperlipidemia, hypertension, seizure disorder  [facility] T[facility] [place]  73.5kg RIJ permcath  1. End Stage Renal Disease: on hemodialysis TTS Emergent hemodialysis treatment for pulmonary edema on admission.  Evaluate daily for dialysis need.  Seen and examined on hemodialysis treatment. Tolerating treatment well.  4K bath  2. Hyponatremia: secondary to volume overload. Na 140 bath. Improved to 132 today   3. Hypertension: blood pressure at goal.  - amlodipine  4. Metabolic acidosis: Diabetic ketoacidosis: anion gap has closed.  Secondary to uncontrolled diabetes mellitus type II with chronic kidney disease.   5. Anemia with chronic kidney disease: hemoglobin 8.4 - Mircera as outpatient.   6. Secondary Hyperparathyroidism: corrected calcium at goal.  - calcium acetate and calcitriol  7. Seizure disorder: on levetiracetam with post dialysis dose.  - Appreciate Neurology input.   LOS: 2 Alyssa Floyd 1/23/20208:32 AM

## 2018-12-20 NOTE — Discharge Summary (Addendum)
[facility] NAME: Dovey Fatzinger    MR#:  [id number]  DATE OF BIRTH:  [birth year]  DATE OF ADMISSION:  12/17/2018   ADMITTING PHYSICIAN: Sedalia Muta, MD  DATE OF DISCHARGE: 12/20/18  PRIMARY CARE PHYSICIAN: Hemming, Claudette Laws, MD   ADMISSION DIAGNOSIS:  Tremor [R25.1] Generalized weakness [R53.1] Diabetes mellitus with hyperosmolarity without hyperglycemic hyperosmolar nonketotic coma ([place]) [E11.00] Type 2 diabetes mellitus with hyperglycemia, with long-term current use of insulin ([facility]) [E11.65, Z79.4] DISCHARGE DIAGNOSIS:  Active Problems:   Hyperglycemia  SECONDARY DIAGNOSIS:   Past Medical History:  Diagnosis Date  . Breast cancer ([place])   . 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 Diabetes mellitus ([place])   . Diabetic nephropathy ([place])   . Hyperlipidemia   . Hypertension   . Nephrotic syndrome    diabetic nephropathy, biopsy on 01/06/16 at [facility]  . Normocytic anemia   . Seizures ([place])   . Subclinical hypothyroidism   . Tobacco use   . Vitamin D deficiency    HOSPITAL COURSE:   Alyssa Floyd is a 53 year old female resented to the ED with confusion and seizure-like activity.  In the ED, her blood sugar was 1200 and she was started on an insulin drip for [name].  She also received IV Keppra for seizures.  She was admitted for further management.  1.DKAin type 1 diabetes -A1c 11.9 this admission -[name] resolved -Initially on insulin drip and then transitioned to subcutaneous insulin -Discharged home on Lantus 15 units daily and NovoLog 4 units 3 times daily with meals -Needs to follow-up with endocrinology as an outpatient  2. Seizure-like activity in a patient with known history of seizure disorder- resolved -CT head negative -EEG with generalized nonspecific cerebral dysfunction -Seen by neurology, who change Keppra dose to 1000 mg daily with an additional 500 mg dose after dialysis  3.  Right-sided pleural effusion with ?multifocal pneumonia. -Chest x-ray with large right-sided pleural effusion -CT chest with large right-sided pleural effusion with partial consolidations in the right upper, middle, and lower lobes which may reflect atelectasis or multifocal pneumonia -Patient was without fever, leukocytosis, or shortness of breath, but she did have a cough, so she was treated empirically with cefdinir qod after HD x 5 days. -s/p right thoracentesis 1/22 with 2 L fluid removed  4. ESRD ON HD TTS -Seen by nephrology -Received dialysis while hospitalized  5.Hypertension-stable. -Continued Norvasc  6.Anemia of chronic disease- stable   DISCHARGE CONDITIONS:  Uncontrolled type 1 diabetes Seizure disorder ESRD on HD Hypertension Anemia of chronic disease CONSULTS OBTAINED:  Treatment Team:  Lavonia Dana, MD Dala Dock, MD DRUG ALLERGIES:   Allergies  Allergen Reactions  . Penicillins Other (See Comments)    Pt reports hallucinations when taking penicillins. >Has patient had a PCN reaction causing immediate rash, facial/tongue/throat swelling, SOB or lightheadedness with hypotension: No Has patient had a PCN reaction causing severe rash involving mucus membranes or skin necrosis: No Has patient had a PCN reaction that required hospitalization No Has patient had a PCN reaction occurring within the last 10 years: No If all of the above answers are "NO", then may proceed with Cephalosporin use.   DISCHARGE MEDICATIONS:   Allergies as of 12/20/2018      Reactions   Penicillins Other (See Comments)   Pt reports hallucinations when taking penicillins. >Has patient had a PCN reaction causing immediate rash, facial/tongue/throat swelling, SOB or lightheadedness with hypotension:  No Has patient had a PCN reaction causing severe rash involving mucus membranes or skin necrosis: No Has patient had a PCN reaction that required hospitalization No Has patient had a PCN  reaction occurring within the last 10 years: No If all of the above answers are "NO", then may proceed with Cephalosporin use.      Medication List    TAKE these medications   amLODipine 10 MG tablet Commonly known as:  NORVASC Take 1 tablet by mouth daily.   atorvastatin 20 MG tablet Commonly known as:  LIPITOR Take 1 tablet by mouth daily.   benzonatate 200 MG capsule Commonly known as:  TESSALON Take 1 capsule (200 mg total) by mouth 3 (three) times daily as needed for cough.   calcitRIOL 0.5 MCG capsule Commonly known as:  ROCALTROL Take 1 capsule by mouth daily.   calcium acetate 667 MG capsule Commonly known as:  PHOSLO Take 1 capsule (667 mg total) by mouth 3 (three) times daily with meals.   cefdinir 300 MG capsule Commonly known as:  OMNICEF Take 1 tablet (300mg ) by mouth every other day after dialysis   ferrous sulfate 325 (65 FE) MG EC tablet Take 1 tablet by mouth daily.   insulin aspart 100 UNIT/ML injection Commonly known as:  novoLOG Inject 4 Units into the skin 3 (three) times daily with meals. Please take fingersticks reading more than 200   insulin glargine 100 UNIT/ML injection Commonly known as:  LANTUS Inject 0.15 mLs (15 Units total) into the skin daily. What changed:  how much to take   levETIRAcetam 1000 MG tablet Commonly known as:  KEPPRA Take 1 tablet (1,000 mg total) by mouth daily. What changed:    medication strength  how much to take  when to take this   levETIRAcetam 500 MG tablet Commonly known as:  KEPPRA Take 1 tablet (500mg ) by mouth after dialysis on Tuesday, Thursday, and Saturday What changed:  You were already taking a medication with the same name, and this prescription was added. Make sure you understand how and when to take each.   potassium chloride 10 MEQ tablet Commonly known as:  K-DUR,KLOR-CON Take 1 tablet by mouth 2 (two) times daily.   sodium bicarbonate 650 MG tablet Take 1 tablet (650 mg total) by  mouth 2 (two) times daily.   SUMAtriptan 50 MG tablet Commonly known as:  IMITREX TK 1 T PO ONCE PRF MIGRAINE. MAY TK SECOND DOSE AFTER 2 H IF NEEDED. TK AT NIGHT.   tamoxifen 20 MG tablet Commonly known as:  NOLVADEX Take 20 mg by mouth daily.   VITAMIN D-1000 MAX ST 25 MCG (1000 UT) tablet Generic drug:  Cholecalciferol Take 2,000 Units by mouth daily.        DISCHARGE INSTRUCTIONS:  1.  Follow-up with PCP in 5 days 2.  Follow-up with endocrinology in 1 to 2 weeks 3.  Discharged on Lantus 15 units daily and NovoLog 4 units 3 times daily with meals 4.  Keppra dose changed by neurology-patient should take 1000 mg daily with an additional 500 mg dose after dialysis DIET:  Diabetic diet DISCHARGE CONDITION:  Stable ACTIVITY:  Activity as tolerated OXYGEN:  Home Oxygen: No.  Oxygen Delivery: room air DISCHARGE LOCATION:  home   If you experience worsening of your admission symptoms, develop shortness of breath, life threatening emergency, suicidal or homicidal thoughts you must seek medical attention immediately by calling 911 or calling your MD immediately  if symptoms less severe.  You Must read complete instructions/literature along with all the possible adverse reactions/side effects for all the Medicines you take and that have been prescribed to you. Take any new Medicines after you have completely understood and accpet all the possible adverse reactions/side effects.   Please note  You were cared for by a hospitalist during your hospital stay. If you have any questions about your discharge medications or the care you received while you were in the hospital after you are discharged, you can call the unit and asked to speak with the hospitalist on call if the hospitalist that took care of you is not available. Once you are discharged, your primary care physician will handle any further medical issues. Please note that NO REFILLS for any discharge medications will be  authorized once you are discharged, as it is imperative that you return to your primary care physician (or establish a relationship with a primary care physician if you do not have one) for your aftercare needs so that they can reassess your need for medications and monitor your lab values.    On the day of Discharge:  VITAL SIGNS:  Blood pressure (!) 150/73, pulse 87, temperature 98.4 F (36.9 C), temperature source Oral, resp. rate 18, height 5\' 5"  (1.651 m), weight 72.8 kg, SpO2 93 %. PHYSICAL EXAMINATION:  GENERAL:  53 y.o.-year-old patient lying in the bed with no acute distress.  EYES: Pupils equal, round, reactive to light and accommodation. No scleral icterus. Extraocular muscles intact.  HEENT: Head atraumatic, normocephalic. Oropharynx and nasopharynx clear.  NECK:  Supple, no jugular venous distention. No thyroid enlargement, no tenderness.  LUNGS: + Diminished breath sounds in the right lower lobe, no wheezing, rales,rhonchi or crepitation. No use of accessory muscles of respiration.  CARDIOVASCULAR: RRR, S1, S2 normal. No murmurs, rubs, or gallops.  ABDOMEN: Soft, non-tender, non-distended. Bowel sounds present. No organomegaly or mass.  EXTREMITIES: No pedal edema, cyanosis, or clubbing.  NEUROLOGIC: Cranial nerves II through XII are intact. Muscle strength 5/5 in all extremities. Sensation intact. Gait not checked.  PSYCHIATRIC: The patient is alert and oriented x 3.  SKIN: No obvious rash, lesion, or ulcer.  DATA REVIEW:   CBC Recent Labs  Lab 12/20/18 0355  WBC 6.8  HGB 8.4*  HCT 27.2*  PLT 162    Chemistries  Recent Labs  Lab 12/17/18 2324 12/18/18 0305  12/20/18 0355  NA 118* 120*   < > 132*  K 3.6 3.7   < > 3.4*  CL 85* 89*   < > 101  CO2 18* 16*   < > 23  GLUCOSE 1,259* 1,170*   < > 309*  BUN 38* 40*   < > 22*  CREATININE 4.05* 4.14*   < > 3.16*  CALCIUM 6.7* 6.7*   < > 6.8*  MG 1.8 1.9  --   --   AST 13*  --   --   --   ALT 12  --   --   --     ALKPHOS 124  --   --   --   BILITOT 1.2  --   --   --    < > = values in this interval not displayed.     Microbiology Results  Results for orders placed or performed during the hospital encounter of 12/17/18  MRSA PCR Screening     Status: None   Collection Time: 12/18/18  3:09 AM  Result Value Ref Range Status   MRSA by  PCR NEGATIVE NEGATIVE Final    Comment:        The GeneXpert MRSA Assay (FDA approved for NASAL specimens only), is one component of a comprehensive MRSA colonization surveillance program. It is not intended to diagnose MRSA infection nor to guide or monitor treatment for MRSA infections. Performed at [facility], [address] [place], [place] [address]   Body fluid culture     Status: None (Preliminary result)   Collection Time: 12/19/18 12:20 PM  Result Value Ref Range Status   Specimen Description   Final    PLEURAL Performed at [facility], [address], [place], [place] [address]    Special Requests   Final    NONE Performed at [facility], [address] [place], [place] [address]    Gram Stain   Final    WBC PRESENT,BOTH PMN AND MONONUCLEAR NO ORGANISMS SEEN CYTOSPIN SMEAR    Culture   Final    NO GROWTH < 24 HOURS Performed at [facility], [place] [address], [place], [place] [address]    Report Status PENDING  Incomplete    RADIOLOGY:  No results found.   Management plans discussed with the patient, family and they are in agreement.  CODE STATUS: Full Code   TOTAL TIME TAKING CARE OF THIS PATIENT: 35 minutes.    Berna Spare Araminta Zorn M.D on 12/20/2018 at 2:27 PM  Between 7am to 6pm - Pager [phone]  After 6pm go to [email or web address] - Proofreader  [facility]  Office  [phone]  CC: Primary care physician; Hemming, Claudette Laws, MD   Note: This dictation was prepared with Dragon dictation along with smaller phrase technology. Any transcriptional  errors that result from this process are unintentional.

## 2018-12-20 NOTE — Progress Notes (Signed)
Patient alert and oriented. No complaints of pain. She did request cough medication. MD to order. Dialysis complete. 3.5 liters removed. Care manager in to speak with patient to address needs. Answered patients questions. Being discharged home.

## 2018-12-20 NOTE — Progress Notes (Signed)
Inpatient Diabetes Program Recommendations  AACE/ADA: New Consensus Statement on Inpatient Glycemic Control (2015)  Target Ranges:  Prepandial:   less than 140 mg/dL      Peak postprandial:   less than 180 mg/dL (1-2 hours)      Critically ill patients:  140 - 180 mg/dL   Lab Results  Component Value Date   GLUCAP 339 (H) 12/20/2018   HGBA1C 11.9 (H) 12/20/2018  Results for SIRENA, RIDDLE (MRN [id number]) as of 12/20/2018 09:47  Ref. Range 08/11/2018 12:17 12/20/2018 03:55  Hemoglobin A1C Latest Ref Range: 4.8 - 5.6 % 13.6 (H) 11.9 (H)    Review of Glycemic ControlResults for ORINE, GOGA (MRN [id number]) as of 12/20/2018 09:47  Ref. Range 12/19/2018 13:07 12/19/2018 15:51 12/19/2018 19:23 12/20/2018 01:02 12/20/2018 02:26  Glucose-Capillary Latest Ref Range: 70 - 99 mg/dL 241 (H) 198 (H) 156 (H) 306 (H) 339 (H)   Admit with:AMS, [name]  History:DM, ESRD  Home DM Meds:Lantus 14 units Daily Novolog 4 units TID with meals Current orders for Inpatient glycemic control:  Novolog 3 units tid with meals, Novolog sensitive tid with meals Inpatient Diabetes Program Recommendations:    Please restart Lantus 15 units daily.  Also please reduce Novolog correction to custom scale starting at 151 mg/dL-200 mg/dL- 1 units, 201-250 mg/dL- 2 units, 251-300 mg/dL- 3 units, 301-350 mg/dL- 4 units, 351-400 mg/dL- 5 units, >401 mg/dL- Give 6 units and call MD.    A1C slightly improved.   Thanks,  Adah Perl, RN, BC-ADM Inpatient Diabetes Coordinator Pager [phone] (8a-5p)

## 2018-12-20 NOTE — Progress Notes (Signed)
Patient states she is feeling nausea. Awaiting for ride. Does not want to have dinner here. Will take insulin at home when she eats. Orders received for Zofran.

## 2018-12-20 NOTE — Care Management Note (Signed)
Case Management Note  Patient Details  Name: Alyssa Floyd MRN: [id number] Date of Birth: 1966/10/16  Subjective/Objective:                  Met with patient to discuss DC plan and needs Patient to DC home today Lives at home and is independent, lives with mom Discussed glucometer and BS checking supplies, has a glucometer and strips and lancets at home and checking BS three times a day Patient has dialysis Tues Thurs and Sat each week Patient has PT at home and does no longer have it Was approved for Disability Is able to afford mediation with Medicaid    Action/Plan:  Patient states that she has no needs at home  Expected Discharge Date:  12/20/18               Expected Discharge Plan:     In-House Referral:     Discharge planning Services  CM Consult  Post Acute Care Choice:    Choice offered to:     DME Arranged:    DME Agency:     HH Arranged:    [place] Agency:     Status of Service:  Completed, signed off  If discussed at [employer], dates discussed:    Additional Comments:  Su Hilt, RN 12/20/2018, 1:06 PM

## 2018-12-22 LAB — BODY FLUID CULTURE: Culture: NO GROWTH

## 2019-01-15 ENCOUNTER — Other Ambulatory Visit: Payer: Self-pay | Admitting: Internal Medicine

## 2019-01-21 ENCOUNTER — Encounter (HOSPITAL_COMMUNITY): Payer: Self-pay | Admitting: Internal Medicine

## 2019-01-21 ENCOUNTER — Inpatient Hospital Stay (HOSPITAL_COMMUNITY)
Admission: AD | Admit: 2019-01-21 | Discharge: 2019-01-26 | DRG: 640 | Disposition: A | Discharge status: Home / Self Care | Source: Other Acute Inpatient Hospital | Attending: Internal Medicine | Admitting: Internal Medicine

## 2019-01-21 DIAGNOSIS — Z992 Dependence on renal dialysis: Secondary | ICD-10-CM

## 2019-01-21 DIAGNOSIS — Z833 Family history of diabetes mellitus: Secondary | ICD-10-CM

## 2019-01-21 DIAGNOSIS — I120 Hypertensive chronic kidney disease with stage 5 chronic kidney disease or end stage renal disease: Secondary | ICD-10-CM | POA: Diagnosis not present

## 2019-01-21 DIAGNOSIS — E8770 Fluid overload, unspecified: Principal | ICD-10-CM | POA: Diagnosis present

## 2019-01-21 DIAGNOSIS — G40909 Epilepsy, unspecified, not intractable, without status epilepticus: Secondary | ICD-10-CM | POA: Diagnosis present

## 2019-01-21 DIAGNOSIS — E1010 Type 1 diabetes mellitus with ketoacidosis without coma: Secondary | ICD-10-CM | POA: Diagnosis present

## 2019-01-21 DIAGNOSIS — Z79810 Long term (current) use of selective estrogen receptor modulators (SERMs): Secondary | ICD-10-CM

## 2019-01-21 DIAGNOSIS — N186 End stage renal disease: Secondary | ICD-10-CM | POA: Diagnosis not present

## 2019-01-21 DIAGNOSIS — J90 Pleural effusion, not elsewhere classified: Secondary | ICD-10-CM | POA: Diagnosis not present

## 2019-01-21 DIAGNOSIS — Z794 Long term (current) use of insulin: Secondary | ICD-10-CM | POA: Diagnosis not present

## 2019-01-21 DIAGNOSIS — J9601 Acute respiratory failure with hypoxia: Secondary | ICD-10-CM | POA: Diagnosis not present

## 2019-01-21 DIAGNOSIS — I10 Essential (primary) hypertension: Secondary | ICD-10-CM | POA: Diagnosis present

## 2019-01-21 DIAGNOSIS — D631 Anemia in chronic kidney disease: Secondary | ICD-10-CM | POA: Diagnosis not present

## 2019-01-21 DIAGNOSIS — Z79899 Other long term (current) drug therapy: Secondary | ICD-10-CM

## 2019-01-21 DIAGNOSIS — Z90711 Acquired absence of uterus with remaining cervical stump: Secondary | ICD-10-CM | POA: Diagnosis not present

## 2019-01-21 DIAGNOSIS — E10649 Type 1 diabetes mellitus with hypoglycemia without coma: Secondary | ICD-10-CM | POA: Diagnosis present

## 2019-01-21 DIAGNOSIS — N2581 Secondary hyperparathyroidism of renal origin: Secondary | ICD-10-CM | POA: Diagnosis not present

## 2019-01-21 DIAGNOSIS — F17210 Nicotine dependence, cigarettes, uncomplicated: Secondary | ICD-10-CM | POA: Diagnosis present

## 2019-01-21 DIAGNOSIS — E1022 Type 1 diabetes mellitus with diabetic chronic kidney disease: Secondary | ICD-10-CM | POA: Diagnosis not present

## 2019-01-21 DIAGNOSIS — Z853 Personal history of malignant neoplasm of breast: Secondary | ICD-10-CM | POA: Diagnosis not present

## 2019-01-21 DIAGNOSIS — E785 Hyperlipidemia, unspecified: Secondary | ICD-10-CM | POA: Diagnosis present

## 2019-01-21 DIAGNOSIS — E039 Hypothyroidism, unspecified: Secondary | ICD-10-CM | POA: Diagnosis not present

## 2019-01-21 DIAGNOSIS — J9600 Acute respiratory failure, unspecified whether with hypoxia or hypercapnia: Secondary | ICD-10-CM | POA: Diagnosis present

## 2019-01-21 HISTORY — DX: Dependence on renal dialysis: Z99.2

## 2019-01-21 HISTORY — DX: End stage renal disease: N18.6

## 2019-01-21 LAB — GLUCOSE, CAPILLARY: Glucose-Capillary: 398 mg/dL — ABNORMAL HIGH (ref 70–99)

## 2019-01-21 NOTE — H&P (Signed)
History and Physical    [facility] KPT:[id number] DOB: October 02, 1966 DOA: 01/21/2019  PCP: Kirke Shaggy, MD Patient coming from: [facility]  Chief Complaint: Shortness of breath  HPI: [facility] is a 53 y.o. female with medical history significant of ESRD on HD TTS, IDDM, hypertension, hyperlipidemia presenting from [facility] ED for further management of volume overload.  Patient reports 1 day history of shortness of breath.  States she has been going to dialysis regularly and her last session was on Saturday.  Denies having any chest pain or cough.  She takes amlodipine for hypertension and states her nephrologist recently started her on lisinopril which she has not picked up yet.  She still makes urine.  Denies having any dysuria, urinary frequency, or urgency.  States she started hemodialysis the end of November.  ED Course:  Per EMS, pulse ox in the 80s.  Treated with nonrebreather with improvement of oxygen saturation.  Briefly placed on BiPAP in the ED and then weaned off to nasal cannula. Temperature 96.6 F White count 6.5, hemoglobin 11.1, platelet count 187 ABG on BiPAP (FiO2 30%): pH 7.37, PCO2 43, PO2 71 Potassium 4.0, bicarb 24, glucose 400, anion gap 11, BUN 23 AST 50, ALT 21, alk phos 142, T bili 0.3 UA: 30-40 WBCs, 20-30 RBCs, few bacteria, negative leukocyte estrace, and negative nitrite. EKG: Sinus rhythm (heart rate 92).  No STEMI.  No prior EKG for comparison. Chest x-ray: Increased opacity at the right lung base since recent thoracentesis (chest x-ray done 12/19/2018), compatible with reaccumulation of pleural fluid.  Mild bilateral interstitial edema.  Patient received a DuoNeb treatment, clonidine 0.1 mg p.o., IV enalaprilat 1.25 mg, IV Lasix 40 mg, IV hydralazine 10 mg, and IV Ativan 1 mg.  Review of Systems: As per HPI otherwise 10 point review of systems negative.  Past Medical History:  Diagnosis Date  . Breast cancer ([facility])   . 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 Diabetes mellitus ([facility])   . Diabetic nephropathy ([place])   . ESRD on dialysis ([facility])   . Hyperlipidemia   . Hypertension   . Nephrotic syndrome    diabetic nephropathy, biopsy on 01/06/16 at [facility]  . Normocytic anemia   . Seizures ([facility])   . Subclinical hypothyroidism   . Tobacco use   . Vitamin D deficiency     Past Surgical History:  Procedure Laterality Date  . BREAST SURGERY    . PARTIAL HYSTERECTOMY       reports that she has been smoking cigarettes. She has been smoking about 0.50 packs per day. She has never used smokeless tobacco. She reports that she does not drink alcohol or use drugs.  Allergies  Allergen Reactions  . Penicillins Other (See Comments)    Pt reports hallucinations when taking penicillins. >Has patient had a PCN reaction causing immediate rash, facial/tongue/throat swelling, SOB or lightheadedness with hypotension: No Has patient had a PCN reaction causing severe rash involving mucus membranes or skin necrosis: No Has patient had a PCN reaction that required hospitalization No Has patient had a PCN reaction occurring within the last 10 years: No If all of the above answers are "NO", then may proceed with Cephalosporin use.    Family History  Problem Relation Age of Onset  . Diabetes Other     Prior to Admission medications   Medication Sig Start Date End Date Taking? Authorizing Provider  amLODipine (NORVASC) 10 MG tablet Take 1 tablet by  mouth daily. 09/19/18   [provider]  atorvastatin (LIPITOR) 20 MG tablet Take 1 tablet by mouth daily. 08/31/18   [provider]  benzonatate (TESSALON) 200 MG capsule Take 1 capsule (200 mg total) by mouth 3 (three) times daily as needed for cough. 12/20/18   Mayo, Pete Pelt, MD  calcitRIOL (ROCALTROL) 0.5 MCG capsule Take 1 capsule by mouth daily. 10/05/18   [provider]  calcium acetate (PHOSLO) 667 MG capsule Take 1 capsule (667  mg total) by mouth 3 (three) times daily with meals. 09/07/18   Earleen Newport, MD  cefdinir (OMNICEF) 300 MG capsule Take 1 tablet (3108m) by mouth every other day after dialysis 12/20/18   Mayo, KPete Pelt MD  Cholecalciferol (VITAMIN D-1000 MAX ST) 25 MCG (1000 UT) tablet Take 2,000 Units by mouth daily. 09/19/18   [provider]  ferrous sulfate 325 (65 FE) MG EC tablet Take 1 tablet by mouth daily. 10/17/18 10/17/19  [provider]  insulin aspart (NOVOLOG) 100 UNIT/ML injection Inject 4 Units into the skin 3 (three) times daily with meals. Please take fingersticks reading more than 200 07/11/18   Elgergawy, DSilver Huguenin MD  insulin glargine (LANTUS) 100 UNIT/ML injection Inject 0.15 mLs (15 Units total) into the skin daily. 12/20/18   Mayo, KPete Pelt MD  levETIRAcetam (KEPPRA) 1000 MG tablet Take 1 tablet (1,000 mg total) by mouth daily. 12/20/18   Mayo, KPete Pelt MD  levETIRAcetam (KEPPRA) 500 MG tablet Take 1 tablet (501m by mouth after dialysis on Tuesday, Thursday, and Saturday 12/20/18   Mayo, KaPete PeltMD  potassium chloride (K-DUR,KLOR-CON) 10 MEQ tablet Take 1 tablet by mouth 2 (two) times daily. 10/05/18   [provider]  sodium bicarbonate 650 MG tablet Take 1 tablet (650 mg total) by mouth 2 (two) times daily. 08/16/18   MoBettey CostaMD  SUMAtriptan (IMITREX) 50 MG tablet TK 1 T PO ONCE PRF MIGRAINE. MAY TK SECOND DOSE AFTER 2 H IF NEEDED. TK AT NIGHT. 09/19/18   [provider]  tamoxifen (NOLVADEX) 20 MG tablet Take 20 mg by mouth daily.    [provider]    Physical Exam: Vitals:   01/22/19 0240 01/22/19 0248 01/22/19 0359 01/22/19 0500  BP: (!) 174/103 (!) 184/99  (!) 159/89  Pulse: 80 80 82 77  Resp: (!) 21 18 18 18   Temp: 97.6 F (36.4 C)     TempSrc: Oral     SpO2: 100% 100% 100% 99%  Weight:      Height:        Physical Exam  Constitutional: She is oriented to person, place, and time. She appears well-developed  and well-nourished.  HENT:  Head: Normocephalic.  Mouth/Throat: Oropharynx is clear and moist.  Eyes: Right eye exhibits no discharge. Left eye exhibits no discharge.  Neck: Neck supple.  Cardiovascular: Normal rate, regular rhythm and intact distal pulses.  Pulmonary/Chest: She has no wheezes. She has no rales.  Increased work of breathing Decreased breath sounds at right lung base  Abdominal: Soft. Bowel sounds are normal. She exhibits no distension. There is no abdominal tenderness. There is no guarding.  Musculoskeletal:        General: Edema present.     Comments: +2 pitting edema of bilateral lower extremities  Neurological: She is alert and oriented to person, place, and time.  Skin: Skin is warm and dry. She is not diaphoretic.     Labs on Admission: I have personally reviewed  following labs and imaging studies  CBC: No results for input(s): WBC, NEUTROABS, HGB, HCT, MCV, PLT in the last 168 hours. Basic Metabolic Panel: No results for input(s): NA, K, CL, CO2, GLUCOSE, BUN, CREATININE, CALCIUM, MG, PHOS in the last 168 hours. GFR: CrCl cannot be calculated (Patient's most recent lab result is older than the maximum 21 days allowed.). Liver Function Tests: No results for input(s): AST, ALT, ALKPHOS, BILITOT, PROT, ALBUMIN in the last 168 hours. No results for input(s): LIPASE, AMYLASE in the last 168 hours. No results for input(s): AMMONIA in the last 168 hours. Coagulation Profile: No results for input(s): INR, PROTIME in the last 168 hours. Cardiac Enzymes: No results for input(s): CKTOTAL, CKMB, CKMBINDEX, TROPONINI in the last 168 hours. BNP (last 3 results) No results for input(s): PROBNP in the last 8760 hours. HbA1C: Recent Labs    01/22/19 0257  HGBA1C 9.2*   CBG: Recent Labs  Lab 01/21/19 2355 01/22/19 0235  GLUCAP 398* 295*   Lipid Profile: No results for input(s): CHOL, HDL, LDLCALC, TRIG, CHOLHDL, LDLDIRECT in the last 72 hours. Thyroid Function  Tests: No results for input(s): TSH, T4TOTAL, FREET4, T3FREE, THYROIDAB in the last 72 hours. Anemia Panel: No results for input(s): VITAMINB12, FOLATE, FERRITIN, TIBC, IRON, RETICCTPCT in the last 72 hours. Urine analysis:    Component Value Date/Time   COLORURINE STRAW (A) 09/07/2018 1041   APPEARANCEUR CLEAR (A) 09/07/2018 1041   LABSPEC 1.010 09/07/2018 1041   PHURINE 6.0 09/07/2018 1041   GLUCOSEU 150 (A) 09/07/2018 1041   HGBUR SMALL (A) 09/07/2018 1041   BILIRUBINUR NEGATIVE 09/07/2018 1041   KETONESUR NEGATIVE 09/07/2018 1041   PROTEINUR 100 (A) 09/07/2018 1041   NITRITE NEGATIVE 09/07/2018 1041   LEUKOCYTESUR TRACE (A) 09/07/2018 1041    Radiological Exams on Admission: No results found.  Assessment/Plan Principal Problem:   Volume overload Active Problems:   [name], type 1 (HCC)   Hypertension   Pleural effusion   ESRD (end stage renal disease) on dialysis (HCC)   Dyspnea secondary to pleural effusion, volume overload in the setting of ESRD on HD -Per patient, she has not missed any dialysis sessions.  Presenting with 1 day history of shortness of breath.  No cough or chest pain. -Hypoxic with pulse ox in the 80s per EMS.  Briefly placed on BiPAP in the ED.  Currently satting well on 2 L oxygen via nasal cannula, however, noted to have increased work of breathing.  ABG with pH 7.39, PCO2 38, PO2 89.  Will place on Bipap for comfort.  -Chest x[facility] done at [facility] read by radiologist as increased opacity at the right lung base since recent thoracentesis (chest x-ray done 12/19/2018), compatible with reaccumulation of pleural fluid.  Mild bilateral interstitial edema. -Stat chest CT w/o contrast ordered for further characterization of pleural effusion, however, patient not able to lay flat due to increased work of breathing. -Received IV Lasix 40 mg in the ED.  Consult nephrology in the morning for dialysis.  Patient will need chest CT done for further characterization of  pleural effusion after dialysis.  Will likely need thoracentesis again.  ESRD on HD TTS -Per patient, she has not missed any dialysis sessions.  Last hemodialysis 2/22.  Does have signs of volume overload.  Oxygen saturation continues to be stable on 2 L supplemental oxygen.  Placed on BiPAP for comfort as patient had increased work of breathing. -Potassium 4.0, bicarb 24 -Consult nephrology in the morning for dialysis.  Hypertension -Received clonidine, enalaprilat, Lasix, and hydralazine in the ED.  Most recent blood pressure 159/89. -Hydralazine PRN  Anemia of chronic disease -Stable.  Hemoglobin 11.1.  Insulin-dependent diabetes mellitus -A1c 9.2.  Blood glucose 400 in the ED.  Labs not suggestive of [name]. -Continue home Lantus 14 units at bedtime -Continue home Humalog 4 units 3 times a day with meals -Sliding scale insulin sensitive with bedtime coverage -CBG checks  Elevated LFTs -AST 50, alk phos 142.  ALT and T bili normal.  Abdominal exam benign. -Continue to monitor  DVT prophylaxis: Subcutaneous heparin Code Status: Patient wishes to be full code. Family Communication: No family available. Disposition Plan: Anticipate discharge after clinical improvement. Consults called: None Admission status: Observation, progressive care unit   Shela Leff MD [facility] Pager [phone]  If 7PM-7AM, please contact night-coverage [email or web address] Password [place] [facility]  01/22/2019, 5:40 AM

## 2019-01-21 NOTE — Progress Notes (Signed)
New Admission Note:   Arrival Method: [place] from [place] Orientation: Alert and oriented x4 Telemetry: Box #13 Assessment: Completed Skin: Intact IV: Lt Wrist Pain: Denies Tubes: N/A Safety Measures: Safety Fall Prevention Plan has been discussed.  Admission: Completed 5MW Orientation: Patient has been orientated to the room, unit and staff.  Family:None at bedside  Orders have been reviewed and implemented. Will continue to monitor the patient. Call light has been placed within reach and bed alarm has been activated. [facility] flow manager text paged of patient's arrival.  [employer] BSN, RN-BC Phone number: [phone]

## 2019-01-21 NOTE — Progress Notes (Signed)
Repaged [facility] admission flow manager. Patient will need admission orders. Patient made comfortable. Call bell within reach. Gaylan Fauver, Wonda Cheng, Therapist, sports

## 2019-01-22 ENCOUNTER — Other Ambulatory Visit: Payer: Self-pay

## 2019-01-22 ENCOUNTER — Encounter (HOSPITAL_COMMUNITY): Payer: Self-pay

## 2019-01-22 DIAGNOSIS — E785 Hyperlipidemia, unspecified: Secondary | ICD-10-CM | POA: Diagnosis present

## 2019-01-22 DIAGNOSIS — J9600 Acute respiratory failure, unspecified whether with hypoxia or hypercapnia: Secondary | ICD-10-CM | POA: Diagnosis present

## 2019-01-22 DIAGNOSIS — I340 Nonrheumatic mitral (valve) insufficiency: Secondary | ICD-10-CM | POA: Diagnosis not present

## 2019-01-22 DIAGNOSIS — Z992 Dependence on renal dialysis: Secondary | ICD-10-CM | POA: Diagnosis not present

## 2019-01-22 DIAGNOSIS — E8770 Fluid overload, unspecified: Secondary | ICD-10-CM | POA: Diagnosis present

## 2019-01-22 DIAGNOSIS — Z853 Personal history of malignant neoplasm of breast: Secondary | ICD-10-CM | POA: Diagnosis not present

## 2019-01-22 DIAGNOSIS — J9601 Acute respiratory failure with hypoxia: Secondary | ICD-10-CM | POA: Diagnosis present

## 2019-01-22 DIAGNOSIS — Z79899 Other long term (current) drug therapy: Secondary | ICD-10-CM | POA: Diagnosis not present

## 2019-01-22 DIAGNOSIS — G40909 Epilepsy, unspecified, not intractable, without status epilepticus: Secondary | ICD-10-CM | POA: Diagnosis present

## 2019-01-22 DIAGNOSIS — J90 Pleural effusion, not elsewhere classified: Secondary | ICD-10-CM | POA: Diagnosis present

## 2019-01-22 DIAGNOSIS — E10649 Type 1 diabetes mellitus with hypoglycemia without coma: Secondary | ICD-10-CM | POA: Diagnosis present

## 2019-01-22 DIAGNOSIS — Z833 Family history of diabetes mellitus: Secondary | ICD-10-CM | POA: Diagnosis not present

## 2019-01-22 DIAGNOSIS — F17210 Nicotine dependence, cigarettes, uncomplicated: Secondary | ICD-10-CM | POA: Diagnosis present

## 2019-01-22 DIAGNOSIS — E1022 Type 1 diabetes mellitus with diabetic chronic kidney disease: Secondary | ICD-10-CM | POA: Diagnosis present

## 2019-01-22 DIAGNOSIS — I10 Essential (primary) hypertension: Secondary | ICD-10-CM | POA: Diagnosis not present

## 2019-01-22 DIAGNOSIS — E039 Hypothyroidism, unspecified: Secondary | ICD-10-CM | POA: Diagnosis present

## 2019-01-22 DIAGNOSIS — N186 End stage renal disease: Secondary | ICD-10-CM | POA: Diagnosis present

## 2019-01-22 DIAGNOSIS — N2581 Secondary hyperparathyroidism of renal origin: Secondary | ICD-10-CM | POA: Diagnosis present

## 2019-01-22 DIAGNOSIS — D631 Anemia in chronic kidney disease: Secondary | ICD-10-CM | POA: Diagnosis present

## 2019-01-22 DIAGNOSIS — Z794 Long term (current) use of insulin: Secondary | ICD-10-CM | POA: Diagnosis not present

## 2019-01-22 DIAGNOSIS — I120 Hypertensive chronic kidney disease with stage 5 chronic kidney disease or end stage renal disease: Secondary | ICD-10-CM | POA: Diagnosis present

## 2019-01-22 DIAGNOSIS — Z90711 Acquired absence of uterus with remaining cervical stump: Secondary | ICD-10-CM | POA: Diagnosis not present

## 2019-01-22 DIAGNOSIS — Z79810 Long term (current) use of selective estrogen receptor modulators (SERMs): Secondary | ICD-10-CM | POA: Diagnosis not present

## 2019-01-22 LAB — CBC
HCT: 31.4 % — ABNORMAL LOW (ref 36.0–46.0)
Hemoglobin: 9.3 g/dL — ABNORMAL LOW (ref 12.0–15.0)
MCH: 26.1 pg (ref 26.0–34.0)
MCHC: 29.6 g/dL — ABNORMAL LOW (ref 30.0–36.0)
MCV: 88 fL (ref 80.0–100.0)
Platelets: 177 10*3/uL (ref 150–400)
RBC: 3.57 MIL/uL — ABNORMAL LOW (ref 3.87–5.11)
RDW: 16.2 % — ABNORMAL HIGH (ref 11.5–15.5)
WBC: 6.1 10*3/uL (ref 4.0–10.5)
nRBC: 0 % (ref 0.0–0.2)

## 2019-01-22 LAB — RENAL FUNCTION PANEL
Albumin: 1.8 g/dL — ABNORMAL LOW (ref 3.5–5.0)
Anion gap: 7 (ref 5–15)
BUN: 25 mg/dL — ABNORMAL HIGH (ref 6–20)
CO2: 24 mmol/L (ref 22–32)
Calcium: 6.5 mg/dL — ABNORMAL LOW (ref 8.9–10.3)
Chloride: 110 mmol/L (ref 98–111)
Creatinine, Ser: 4.15 mg/dL — ABNORMAL HIGH (ref 0.44–1.00)
GFR calc Af Amer: 13 mL/min — ABNORMAL LOW (ref >60)
GFR calc non Af Amer: 12 mL/min — ABNORMAL LOW (ref >60)
GLUCOSE: 130 mg/dL — AB (ref 70–99)
Phosphorus: 4.6 mg/dL (ref 2.5–4.6)
Potassium: 3.6 mmol/L (ref 3.5–5.1)
Sodium: 141 mmol/L (ref 135–145)

## 2019-01-22 LAB — BLOOD GAS, ARTERIAL
Acid-base deficit: 1.2 mmol/L (ref 0.0–2.0)
Bicarbonate: 23 mmol/L (ref 20.0–28.0)
Drawn by: 317771
O2 Content: 3 L/min
O2 Saturation: 96.5 %
Patient temperature: 98.6
pCO2 arterial: 38.6 mmHg (ref 32.0–48.0)
pH, Arterial: 7.393 (ref 7.350–7.450)
pO2, Arterial: 89.3 mmHg (ref 83.0–108.0)

## 2019-01-22 LAB — GLUCOSE, CAPILLARY
Glucose-Capillary: 295 mg/dL — ABNORMAL HIGH (ref 70–99)
Glucose-Capillary: 145 mg/dL — ABNORMAL HIGH (ref 70–99)
Glucose-Capillary: 147 mg/dL — ABNORMAL HIGH (ref 70–99)
Glucose-Capillary: 17 mg/dL — CL (ref 70–99)
Glucose-Capillary: 215 mg/dL — ABNORMAL HIGH (ref 70–99)

## 2019-01-22 LAB — COMPREHENSIVE METABOLIC PANEL
ALT: 15 U/L (ref 0–44)
ANION GAP: 7 (ref 5–15)
AST: 17 U/L (ref 15–41)
Albumin: 1.8 g/dL — ABNORMAL LOW (ref 3.5–5.0)
Alkaline Phosphatase: 89 U/L (ref 38–126)
BILIRUBIN TOTAL: 0.6 mg/dL (ref 0.3–1.2)
BUN: 25 mg/dL — ABNORMAL HIGH (ref 6–20)
CO2: 24 mmol/L (ref 22–32)
Calcium: 6.5 mg/dL — ABNORMAL LOW (ref 8.9–10.3)
Chloride: 111 mmol/L (ref 98–111)
Creatinine, Ser: 4.22 mg/dL — ABNORMAL HIGH (ref 0.44–1.00)
GFR calc Af Amer: 13 mL/min — ABNORMAL LOW (ref >60)
GFR calc non Af Amer: 11 mL/min — ABNORMAL LOW (ref >60)
GLUCOSE: 130 mg/dL — AB (ref 70–99)
Potassium: 3.7 mmol/L (ref 3.5–5.1)
Sodium: 142 mmol/L (ref 135–145)
TOTAL PROTEIN: 5.1 g/dL — AB (ref 6.5–8.1)

## 2019-01-22 LAB — PHOSPHORUS: Phosphorus: 4.6 mg/dL (ref 2.5–4.6)

## 2019-01-22 LAB — HEPATITIS B SURFACE ANTIGEN: Hepatitis B Surface Ag: NEGATIVE

## 2019-01-22 LAB — HEMOGLOBIN A1C
Hgb A1c MFr Bld: 9.2 % — ABNORMAL HIGH (ref 4.8–5.6)
Mean Plasma Glucose: 217.34 mg/dL

## 2019-01-22 LAB — LACTATE DEHYDROGENASE: LDH: 226 U/L — ABNORMAL HIGH (ref 98–192)

## 2019-01-22 LAB — MAGNESIUM: Magnesium: 1.9 mg/dL (ref 1.7–2.4)

## 2019-01-22 LAB — MRSA PCR SCREENING: MRSA by PCR: NEGATIVE

## 2019-01-22 MED ORDER — HEPARIN SODIUM (PORCINE) 1000 UNIT/ML DIALYSIS
1000.0000 [IU] | INTRAMUSCULAR | Status: DC | PRN
  Administered 2019-01-22: 1000 [IU] via INTRAVENOUS_CENTRAL
  Filled 2019-01-22: qty 1

## 2019-01-22 MED ORDER — INSULIN GLARGINE 100 UNIT/ML ~~LOC~~ SOLN
14.0000 [IU] | Freq: Every day | SUBCUTANEOUS | Status: DC
  Administered 2019-01-22: 14 [IU] via SUBCUTANEOUS
  Filled 2019-01-22 (×3): qty 0.14

## 2019-01-22 MED ORDER — HEPARIN SODIUM (PORCINE) 5000 UNIT/ML IJ SOLN
5000.0000 [IU] | Freq: Three times a day (TID) | INTRAMUSCULAR | Status: DC
  Administered 2019-01-22 – 2019-01-26 (×12): 5000 [IU] via SUBCUTANEOUS
  Filled 2019-01-22 (×12): qty 1

## 2019-01-22 MED ORDER — LEVETIRACETAM 500 MG PO TABS
1000.0000 mg | ORAL_TABLET | Freq: Every day | ORAL | Status: DC
  Administered 2019-01-22 – 2019-01-26 (×5): 1000 mg via ORAL
  Filled 2019-01-22 (×6): qty 2

## 2019-01-22 MED ORDER — DEXTROSE 50 % IV SOLN
50.0000 mL | Freq: Once | INTRAVENOUS | Status: AC
  Administered 2019-01-22: 50 mL via INTRAVENOUS

## 2019-01-22 MED ORDER — ACETAMINOPHEN 650 MG RE SUPP: 650.0000 mg | Freq: Four times a day (QID) | RECTAL | Status: DC | PRN

## 2019-01-22 MED ORDER — CALCIUM ACETATE (PHOS BINDER) 667 MG PO CAPS
667.0000 mg | ORAL_CAPSULE | Freq: Three times a day (TID) | ORAL | Status: DC
  Administered 2019-01-22 – 2019-01-26 (×10): 667 mg via ORAL
  Filled 2019-01-22 (×10): qty 1

## 2019-01-22 MED ORDER — ALBUTEROL SULFATE (2.5 MG/3ML) 0.083% IN NEBU: 2.5000 mg | INHALATION_SOLUTION | RESPIRATORY_TRACT | Status: DC | PRN

## 2019-01-22 MED ORDER — HYDRALAZINE HCL 20 MG/ML IJ SOLN
5.0000 mg | INTRAMUSCULAR | Status: DC | PRN
  Administered 2019-01-22 (×2): 5 mg via INTRAVENOUS
  Filled 2019-01-22 (×2): qty 1

## 2019-01-22 MED ORDER — ALBUTEROL SULFATE (2.5 MG/3ML) 0.083% IN NEBU: 2.5000 mg | INHALATION_SOLUTION | Freq: Four times a day (QID) | RESPIRATORY_TRACT | Status: DC | PRN

## 2019-01-22 MED ORDER — FERROUS SULFATE 325 (65 FE) MG PO TABS
325.0000 mg | ORAL_TABLET | Freq: Every day | ORAL | Status: DC
  Administered 2019-01-22 – 2019-01-26 (×5): 325 mg via ORAL
  Filled 2019-01-22 (×5): qty 1

## 2019-01-22 MED ORDER — LIDOCAINE HCL (PF) 1 % IJ SOLN: 5.0000 mL | INTRAMUSCULAR | Status: DC | PRN

## 2019-01-22 MED ORDER — ALTEPLASE 2 MG IJ SOLR: 2.0000 mg | Freq: Once | INTRAMUSCULAR | Status: DC | PRN

## 2019-01-22 MED ORDER — LIDOCAINE-PRILOCAINE 2.5-2.5 % EX CREA: 1.0000 "application " | TOPICAL_CREAM | CUTANEOUS | Status: DC | PRN

## 2019-01-22 MED ORDER — AMLODIPINE BESYLATE 10 MG PO TABS
10.0000 mg | ORAL_TABLET | Freq: Every day | ORAL | Status: DC
  Administered 2019-01-22 – 2019-01-26 (×5): 10 mg via ORAL
  Filled 2019-01-22 (×5): qty 1

## 2019-01-22 MED ORDER — INSULIN ASPART 100 UNIT/ML ~~LOC~~ SOLN: 0.0000 [IU] | Freq: Three times a day (TID) | SUBCUTANEOUS | Status: DC

## 2019-01-22 MED ORDER — VITAMIN D 25 MCG (1000 UNIT) PO TABS
2000.0000 [IU] | ORAL_TABLET | Freq: Every day | ORAL | Status: DC
  Administered 2019-01-23 – 2019-01-26 (×4): 2000 [IU] via ORAL
  Filled 2019-01-22 (×4): qty 2

## 2019-01-22 MED ORDER — PENTAFLUOROPROP-TETRAFLUOROETH EX AERO: 1.0000 "application " | INHALATION_SPRAY | CUTANEOUS | Status: DC | PRN

## 2019-01-22 MED ORDER — SODIUM CHLORIDE 0.9 % IV SOLN: 100.0000 mL | INTRAVENOUS | Status: DC | PRN

## 2019-01-22 MED ORDER — INSULIN ASPART 100 UNIT/ML ~~LOC~~ SOLN
0.0000 [IU] | Freq: Every day | SUBCUTANEOUS | Status: DC
  Administered 2019-01-22: 5 [IU] via SUBCUTANEOUS

## 2019-01-22 MED ORDER — ACETAMINOPHEN 325 MG PO TABS: 650.0000 mg | ORAL_TABLET | Freq: Four times a day (QID) | ORAL | Status: DC | PRN

## 2019-01-22 MED ORDER — HEPARIN SODIUM (PORCINE) 1000 UNIT/ML DIALYSIS: 20.0000 [IU]/kg | INTRAMUSCULAR | Status: DC | PRN

## 2019-01-22 MED ORDER — BENZONATATE 100 MG PO CAPS
200.0000 mg | ORAL_CAPSULE | Freq: Three times a day (TID) | ORAL | Status: DC | PRN
  Administered 2019-01-24: 200 mg via ORAL
  Filled 2019-01-22: qty 2

## 2019-01-22 MED ORDER — INSULIN ASPART 100 UNIT/ML ~~LOC~~ SOLN
4.0000 [IU] | Freq: Three times a day (TID) | SUBCUTANEOUS | Status: DC
  Administered 2019-01-22: 4 [IU] via SUBCUTANEOUS

## 2019-01-22 MED ORDER — CHLORHEXIDINE GLUCONATE CLOTH 2 % EX PADS
6.0000 | MEDICATED_PAD | Freq: Every day | CUTANEOUS | Status: DC
  Administered 2019-01-23 – 2019-01-26 (×3): 6 via TOPICAL

## 2019-01-22 MED ORDER — TAMOXIFEN CITRATE 10 MG PO TABS
20.0000 mg | ORAL_TABLET | Freq: Every day | ORAL | Status: DC
  Administered 2019-01-22 – 2019-01-26 (×5): 20 mg via ORAL
  Filled 2019-01-22 (×5): qty 2

## 2019-01-22 MED ORDER — HEPARIN SODIUM (PORCINE) 1000 UNIT/ML IJ SOLN
INTRAMUSCULAR | Status: AC
  Administered 2019-01-22: 1000 [IU] via INTRAVENOUS_CENTRAL
  Filled 2019-01-22: qty 1

## 2019-01-22 MED ORDER — INSULIN ASPART 100 UNIT/ML ~~LOC~~ SOLN
0.0000 [IU] | Freq: Three times a day (TID) | SUBCUTANEOUS | Status: DC
  Administered 2019-01-22 (×2): 1 [IU] via SUBCUTANEOUS
  Administered 2019-01-23: 7 [IU] via SUBCUTANEOUS

## 2019-01-22 MED ORDER — ATORVASTATIN CALCIUM 10 MG PO TABS
20.0000 mg | ORAL_TABLET | Freq: Every day | ORAL | Status: DC
  Administered 2019-01-22 – 2019-01-26 (×5): 20 mg via ORAL
  Filled 2019-01-22 (×5): qty 2

## 2019-01-22 MED ORDER — CALCITRIOL 0.25 MCG PO CAPS
0.5000 ug | ORAL_CAPSULE | Freq: Every day | ORAL | Status: DC
  Administered 2019-01-22 – 2019-01-26 (×5): 0.5 ug via ORAL
  Filled 2019-01-22 (×3): qty 2

## 2019-01-22 MED ORDER — DEXTROSE 50 % IV SOLN: 1.0000 | Freq: Once | INTRAVENOUS | Status: AC

## 2019-01-22 NOTE — Procedures (Signed)
I was present at this dialysis session. I have reviewed the session itself and made appropriate changes.   Vital signs in last 24 hours:  Temp:  [97.4 F (36.3 C)-97.7 F (36.5 C)] 97.4 F (36.3 C) (02/25 1025) Pulse Rate:  [74-96] 82 (02/25 1115) Resp:  [15-23] 21 (02/25 1115) BP: (152-184)/(84-110) 162/92 (02/25 1115) SpO2:  [96 %-100 %] 100 % (02/25 1025) FiO2 (%):  [30 %-35 %] 30 % (02/25 0359) Weight:  [74.9 kg-75.4 kg] 74.9 kg (02/25 1025) Weight change:  Filed Weights   01/21/19 2143 01/22/19 1025  Weight: 75.4 kg 74.9 kg    Recent Labs  Lab 01/22/19 0935  NA 142  141  K 3.7  3.6  CL 111  110  CO2 24  24  GLUCOSE 130*  130*  BUN 25*  25*  CREATININE 4.22*  4.15*  CALCIUM 6.5*  6.5*  PHOS 4.6  4.6    Recent Labs  Lab 01/22/19 0935  WBC 6.1  HGB 9.3*  HCT 31.4*  MCV 88.0  PLT 177    Scheduled Meds: . amLODipine  10 mg Oral Daily  . atorvastatin  20 mg Oral Daily  . calcitRIOL  0.5 mcg Oral Daily  . calcium acetate  667 mg Oral TID WC  . Chlorhexidine Gluconate Cloth  6 each Topical Q0600  . cholecalciferol  2,000 Units Oral Daily  . ferrous sulfate  325 mg Oral Daily  . heparin  5,000 Units Subcutaneous Q8H  . insulin aspart  0-5 Units Subcutaneous QHS  . insulin aspart  0-9 Units Subcutaneous TID WC  . insulin aspart  4 Units Subcutaneous TID WC  . insulin glargine  14 Units Subcutaneous QHS  . levETIRAcetam  1,000 mg Oral Daily  . tamoxifen  20 mg Oral Daily   Continuous Infusions: . sodium chloride    . sodium chloride     PRN Meds:.sodium chloride, sodium chloride, albuterol, alteplase, benzonatate, heparin, heparin, hydrALAZINE, lidocaine (PF), lidocaine-prilocaine, pentafluoroprop-tetrafluoroeth   Donetta Potts,  MD 01/22/2019, 11:25 AM

## 2019-01-22 NOTE — Progress Notes (Signed)
Patient c/o difficulty breathing.Patient sitting up on the side of bed. Oxygen at 3L with O2 sat of 96%. CBG check=398. Text paged Rathore,MD. Will continue to monitor. Emersynn Deatley, Wonda Cheng, Therapist, sports

## 2019-01-22 NOTE — Progress Notes (Signed)
Report given to 2W RN. Patient going to 2W23. Lennyn Gange, Wonda Cheng, Therapist, sports

## 2019-01-22 NOTE — Progress Notes (Addendum)
Patient arrived to unit 2 west bed 23 from unit 5 m.Patient complaining of shortness of breath with oxygen at 2 liters nasal cannula and oxygen saturations 97 %. Respiratory therapy notified to come place patient on bipap .Patient denies pain at present time.Oriented patient to this nursing unit and call bell system.Educated patient not to get out of bed without assistance from nursing staff and verbalized understanding.Call bell within reach.Will continue to monitor patient.

## 2019-01-22 NOTE — Progress Notes (Signed)
[place] TEAM 1 - Stepdown/ICU TEAM  [facility]  MVE:[id number] DOB: 10-Aug-1966 DOA: 01/21/2019 PCP: Kirke Shaggy, MD    Brief Narrative:  53yo w/ ESRD on HD TTS, DM, hypertension, and hyperlipidemia who presented from [facility] ED for volume overload w/ SOB.   In the ED her pulse ox was in the 80s. A CXR noted increased opacity at the right lung base since recent thoracentesis (12/19/2018), compatible with reaccumulation of pleural fluid.   Significant Events: 2/24 admit   Subjective: Feeling somewhat better, but remains on BIPAP. Denies CP, N/V, or abdom pain.   Assessment & Plan:  Dyspnea - volume overload  has not missed HD per hx - after HD will pursue CT chest to further characterize the effusion - Nephrology alerted to need for HD - suspect her large R effusion is a major contributor   R Pleural effusion  Admitted and tx for PNA w/ thoracentesis (2L removed) 1/21 > 12/20/18 at [facility] - fluid analysis not complete, but appears was most c/w a transudate - has re-accumulated on my review of CXR from 2/24 - will plan for repeat thoracentesis today   Remote hx of Breast CA Still has port in L chest - reports no known recurrence in f/u - cytology from [facility] in Jan 2020 was negative   ESRD on HD TTS last hemodialysis 2/22 - Nephrology consulted   Hypertension BP not at goal - will not adjust tx until after HD   Anemia of chronic kidney disease Stable by report - f/u CBC pending   DM2 A1c 9.2 - CBG variable - follow w/o change for now  Seizure D/O Continue usual home med  DVT prophylaxis: SQ heparin  Code Status: FULL CODE Family Communication: no family present at time of exam  Disposition Plan: SDU  Consultants:  Nephrology   Antimicrobials:  None    Objective: Blood pressure (!) 152/94, pulse 78, temperature 97.6 F (36.4 C), temperature source Axillary, resp. rate 18, height 5\' 5"  (1.651 m), weight 75.4 kg, SpO2 100  %.  Intake/Output Summary (Last 24 hours) at 01/22/2019 0807 Last data filed at 01/22/2019 0151 Gross per 24 hour  Intake -  Output 400 ml  Net -400 ml   Filed Weights   01/21/19 2143  Weight: 75.4 kg    Examination: General: No acute respiratory distress on BIPAP - alert and conversant  Lungs: blunting of breath sounds in R base - fine scattered crackles - no wheeze  Cardiovascular: Regular rate and rhythm without murmur gallop or rub normal S1 and S2 Abdomen: Nontender, nondistended, soft, bowel sounds positive, no rebound, no ascites, no appreciable mass Extremities: trace B LE edema   CBC: No results for input(s): WBC, NEUTROABS, HGB, HCT, MCV, PLT in the last 168 hours. Basic Metabolic Panel: No results for input(s): NA, K, CL, CO2, GLUCOSE, BUN, CREATININE, CALCIUM, MG, PHOS in the last 168 hours. GFR: CrCl cannot be calculated (Patient's most recent lab result is older than the maximum 21 days allowed.).  Liver Function Tests: No results for input(s): AST, ALT, ALKPHOS, BILITOT, PROT, ALBUMIN in the last 168 hours. No results for input(s): LIPASE, AMYLASE in the last 168 hours. No results for input(s): AMMONIA in the last 168 hours.  Coagulation Profile: No results for input(s): INR, PROTIME in the last 168 hours.  Cardiac Enzymes: No results for input(s): CKTOTAL, CKMB, CKMBINDEX, TROPONINI in the last 168 hours.  HbA1C: Hgb A1c MFr Bld  Date/Time Value Ref Range Status  01/22/2019 02:57 AM 9.2 (H) 4.8 - 5.6 % Final    Comment:    (NOTE) Pre diabetes:          5.7%-6.4% Diabetes:              >6.4% Glycemic control for   <7.0% adults with diabetes   12/20/2018 03:55 AM 11.9 (H) 4.8 - 5.6 % Final    Comment:    (NOTE) Pre diabetes:          5.7%-6.4% Diabetes:              >6.4% Glycemic control for   <7.0% adults with diabetes     CBG: Recent Labs  Lab 01/21/19 2355 01/22/19 0235  GLUCAP 398* 295*    Recent Results (from the past 240 hour(s))   MRSA PCR Screening     Status: None   Collection Time: 01/22/19 12:32 AM  Result Value Ref Range Status   MRSA by PCR NEGATIVE NEGATIVE Final    Comment:        The GeneXpert MRSA Assay (FDA approved for NASAL specimens only), is one component of a comprehensive MRSA colonization surveillance program. It is not intended to diagnose MRSA infection nor to guide or monitor treatment for MRSA infections. Performed at [facility], [place] [address], [place], [place] [address]      Scheduled Meds: . heparin  5,000 Units Subcutaneous Q8H  . insulin aspart  0-5 Units Subcutaneous QHS  . insulin aspart  0-9 Units Subcutaneous TID WC  . insulin aspart  4 Units Subcutaneous TID WC  . insulin glargine  14 Units Subcutaneous QHS     LOS: 0 days   Cherene Altes, MD [facility] Office  [phone] Pager - Text Page per Shea Evans  If 7PM-7AM, please contact night-coverage per Amion 01/22/2019, 8:07 AM

## 2019-01-22 NOTE — Progress Notes (Signed)
Called to patient's bedside because patient unresponsive.  VSS. CBG being check upon my arrival.  CBG 17.  1 amp D50 given IV.  Patient rapidly improved and was fully alert and oriented.  Encourage to drink juice and eat graham crackers with peanut butter.  Repeat CBG 147.  RN to notify MD of event and call RRT if assistance needed.

## 2019-01-22 NOTE — Progress Notes (Signed)
Received call from CT. Unable to perform CT as patient can not lay flat.Rathore,MD made aware. No new order. MD told this RN to go ahead and transfer patient to 2W asap as patient needs bipap. Zenith Kercheval, Wonda Cheng, Therapist, sports

## 2019-01-22 NOTE — Consult Note (Signed)
[facility] Renal Consultation Note    Indication for Consultation:  Management of ESRD/hemodialysis; anemia, hypertension/volume and secondary hyperparathyroidism  HPI: Alyssa Floyd is a 53 y.o. female.  Alyssa Floyd has a [place] significant for ESRD on HD every TTS at [facility], IDDM, HTN, HLD, h/o breast cancer, and tobacco abuse who presented to [facility] with worsening SOB.  She states that she completed her session of HD on Saturday and only had 0.6kg above edw.  She has been losing weight and nsg staff at the HD center is concerned that she has not been eating as she often develops hypoglycemia at the kidney center.  She is living in [place] but drives to [place] for HD due to financial issues.   Past Medical History:  Diagnosis Date  . Breast cancer ([place])   . 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 Diabetes mellitus ([place])   . Diabetic nephropathy ([place])   . ESRD on dialysis ([place])   . Hyperlipidemia   . Hypertension   . Nephrotic syndrome    diabetic nephropathy, biopsy on 01/06/16 at [facility]  . Normocytic anemia   . Seizures ([place])   . Subclinical hypothyroidism   . Tobacco use   . Vitamin D deficiency    Past Surgical History:  Procedure Laterality Date  . BREAST SURGERY    . PARTIAL HYSTERECTOMY     Family History:   Family History  Problem Relation Age of Onset  . Diabetes Other    Social History:  reports that she has been smoking cigarettes. She has been smoking about 0.50 packs per day. She has never used smokeless tobacco. She reports that she does not drink alcohol or use drugs. Allergies  Allergen Reactions  . Penicillins Other (See Comments)    Pt reports hallucinations when taking penicillins. >Has patient had a PCN reaction causing immediate rash, facial/tongue/throat swelling, SOB or lightheadedness with hypotension: No Has patient had a PCN reaction causing severe rash involving mucus membranes  or skin necrosis: No Has patient had a PCN reaction that required hospitalization No Has patient had a PCN reaction occurring within the last 10 years: No If all of the above answers are "NO", then may proceed with Cephalosporin use.   Prior to Admission medications   Medication Sig Start Date End Date Taking? Authorizing Provider  amLODipine (NORVASC) 10 MG tablet Take 1 tablet by mouth daily. 09/19/18   [provider]  atorvastatin (LIPITOR) 20 MG tablet Take 1 tablet by mouth daily. 08/31/18   [provider]  benzonatate (TESSALON) 200 MG capsule Take 1 capsule (200 mg total) by mouth 3 (three) times daily as needed for cough. 12/20/18   Mayo, Pete Pelt, MD  calcitRIOL (ROCALTROL) 0.5 MCG capsule Take 1 capsule by mouth daily. 10/05/18   [provider]  calcium acetate (PHOSLO) 667 MG capsule Take 1 capsule (667 mg total) by mouth 3 (three) times daily with meals. 09/07/18   Earleen Newport, MD  cefdinir (OMNICEF) 300 MG capsule Take 1 tablet (300mg ) by mouth every other day after dialysis 12/20/18   Mayo, Pete Pelt, MD  Cholecalciferol (VITAMIN D-1000 MAX ST) 25 MCG (1000 UT) tablet Take 2,000 Units by mouth daily. 09/19/18   [provider]  ferrous sulfate 325 (65 FE) MG EC tablet Take 1 tablet by mouth daily. 10/17/18 10/17/19  [provider]  insulin aspart (NOVOLOG) 100 UNIT/ML injection Inject 4 Units into the skin 3 (  three) times daily with meals. Please take fingersticks reading more than 200 07/11/18   Elgergawy, Silver Huguenin, MD  insulin glargine (LANTUS) 100 UNIT/ML injection Inject 0.15 mLs (15 Units total) into the skin daily. 12/20/18   Mayo, Pete Pelt, MD  levETIRAcetam (KEPPRA) 1000 MG tablet Take 1 tablet (1,000 mg total) by mouth daily. 12/20/18   Mayo, Pete Pelt, MD  levETIRAcetam (KEPPRA) 500 MG tablet Take 1 tablet (500mg ) by mouth after dialysis on Tuesday, Thursday, and Saturday 12/20/18   Mayo, Pete Pelt, MD  potassium chloride  (K-DUR,KLOR-CON) 10 MEQ tablet Take 1 tablet by mouth 2 (two) times daily. 10/05/18   [provider]  sodium bicarbonate 650 MG tablet Take 1 tablet (650 mg total) by mouth 2 (two) times daily. 08/16/18   Bettey Costa, MD  SUMAtriptan (IMITREX) 50 MG tablet TK 1 T PO ONCE PRF MIGRAINE. MAY TK SECOND DOSE AFTER 2 H IF NEEDED. TK AT NIGHT. 09/19/18   [provider]  tamoxifen (NOLVADEX) 20 MG tablet Take 20 mg by mouth daily.    [provider]   Current Facility-Administered Medications  Medication Dose Route Frequency Provider Last Rate Last Dose  . 0.9 %  sodium chloride infusion  100 mL Intravenous PRN Donato Heinz, MD      . 0.9 %  sodium chloride infusion  100 mL Intravenous PRN Donato Heinz, MD      . albuterol (PROVENTIL) (2.5 MG/3ML) 0.083% nebulizer solution 2.5 mg  2.5 mg Nebulization Q2H PRN Cherene Altes, MD      . alteplase (CATHFLO ACTIVASE) injection 2 mg  2 mg Intracatheter Once PRN Donato Heinz, MD      . amLODipine (NORVASC) tablet 10 mg  10 mg Oral Daily Joette Catching T, MD      . atorvastatin (LIPITOR) tablet 20 mg  20 mg Oral Daily Joette Catching T, MD      . benzonatate (TESSALON) capsule 200 mg  200 mg Oral TID PRN Cherene Altes, MD      . calcitRIOL (ROCALTROL) capsule 0.5 mcg  0.5 mcg Oral Daily Joette Catching T, MD      . calcium acetate (PHOSLO) capsule 667 mg  667 mg Oral TID WC Cherene Altes, MD      . Chlorhexidine Gluconate Cloth 2 % PADS 6 each  6 each Topical Q0600 Donato Heinz, MD      . cholecalciferol (VITAMIN D3) tablet 2,000 Units  2,000 Units Oral Daily Joette Catching T, MD      . ferrous sulfate tablet 325 mg  325 mg Oral Daily Joette Catching T, MD      . heparin injection 1,000 Units  1,000 Units Dialysis PRN Donato Heinz, MD      . heparin injection 1,500 Units  20 Units/kg Dialysis PRN Donato Heinz, MD      . heparin injection 5,000 Units  5,000 Units Subcutaneous Camelia Phenes  Shela Leff, MD   5,000 Units at 01/22/19 0615  . hydrALAZINE (APRESOLINE) injection 5 mg  5 mg Intravenous Q4H PRN Shela Leff, MD   5 mg at 01/22/19 0153  . insulin aspart (novoLOG) injection 0-5 Units  0-5 Units Subcutaneous QHS Shela Leff, MD   5 Units at 01/22/19 0049  . insulin aspart (novoLOG) injection 0-9 Units  0-9 Units Subcutaneous TID WC Shela Leff, MD   1 Units at 01/22/19 [phone]  . insulin aspart (novoLOG) injection 4 Units  4 Units Subcutaneous TID WC Shela Leff, MD  4 Units at 01/22/19 9371  . insulin glargine (LANTUS) injection 14 Units  14 Units Subcutaneous QHS Shela Leff, MD   14 Units at 01/22/19 0237  . levETIRAcetam (KEPPRA) tablet 1,000 mg  1,000 mg Oral Daily Cherene Altes, MD      . lidocaine (PF) (XYLOCAINE) 1 % injection 5 mL  5 mL Intradermal PRN Donato Heinz, MD      . lidocaine-prilocaine (EMLA) cream 1 application  1 application Topical PRN Donato Heinz, MD      . pentafluoroprop-tetrafluoroeth (GEBAUERS) aerosol 1 application  1 application Topical PRN Donato Heinz, MD      . tamoxifen (NOLVADEX) tablet 20 mg  20 mg Oral Daily Cherene Altes, MD       Labs: Basic Metabolic Panel: Recent Labs  Lab 01/22/19 0935  NA 141  K 3.6  CL 110  CO2 24  GLUCOSE 130*  BUN 25*  CREATININE 4.15*  CALCIUM 6.5*  PHOS 4.6   Liver Function Tests: Recent Labs  Lab 01/22/19 0935  ALBUMIN 1.8*   No results for input(s): LIPASE, AMYLASE in the last 168 hours. No results for input(s): AMMONIA in the last 168 hours. CBC: Recent Labs  Lab 01/22/19 0935  WBC 6.1  HGB 9.3*  HCT 31.4*  MCV 88.0  PLT 177   Cardiac Enzymes: No results for input(s): CKTOTAL, CKMB, CKMBINDEX, TROPONINI in the last 168 hours. CBG: Recent Labs  Lab 01/21/19 2355 01/22/19 0235 01/22/19 0800  GLUCAP 398* 295* 145*   Iron Studies: No results for input(s): IRON, TIBC, TRANSFERRIN, FERRITIN in the last 72  hours. Studies/Results: No results found.  ROS: Pertinent items are noted in HPI. Physical Exam: Vitals:   01/22/19 0800 01/22/19 1025 01/22/19 1030 01/22/19 1045  BP:  (!) 162/84 (!) 159/101 (!) 161/98  Pulse: 79 88 87 85  Resp: 20 17 17 18   Temp:  (!) 97.4 F (36.3 C)    TempSrc:  Oral    SpO2: 100% 100%    Weight:      Height:          Weight change:   Intake/Output Summary (Last 24 hours) at 01/22/2019 1056 Last data filed at 01/22/2019 0151 Gross per 24 hour  Intake -  Output 400 ml  Net -400 ml   BP (!) 161/98 (BP Location: Right Arm)   Pulse 85   Temp (!) 97.4 F (36.3 C) (Oral)   Resp 18   Ht 5\' 5"  (1.651 m)   Wt 75.4 kg   SpO2 100%   BMI 27.66 kg/m  General appearance: mild distress, slowed mentation and wearing BiPap Head: Normocephalic, without obvious abnormality, atraumatic Eyes: negative findings: lids and lashes normal, conjunctivae and sclerae normal and corneas clear Resp: rales bibasilar Cardio: regular rate and rhythm and no rub GI: soft, non-tender; bowel sounds normal; no masses,  no organomegaly Extremities: edema 1+ lower extremity edema Neuro: delayed mentation and somewhat confused about her primary Nephrologist but oriented to person and place Dialysis Access:  Dialysis Orders: Center: [facility]  on TTS. EDW 73.5kg HD Bath 3K/2.5Ca  Time 4 hrs Heparin 2000. Access RIJ tdc BFR 400 DFR 800   Micera 284mcg IV every 2 weeks, calcitriol 0.35mcg po qTTS  Assessment/Plan: 1.  Hypoxic respiratory failure- presumably due to volume overload in setting of ESRD and weight loss.  Will plan for HD today with UF as tolerated and challenge edw. 2.  ESRD -  Continue with HD qTTS 3.  Hypertension/volume  -  As above, will challenge edw and follow respiratory status 4.  Anemia  -  On micera as an outpatient. 5.  Metabolic bone disease -   Continue with meds 6.  Nutrition -  Renal diet and will need calorie count to make sure she is getting enough  nutrition. 7. Pleural effusions- plan for CT scan for further workup per primary svc.  8. Elevated AST- unclear if she is drinking etoh at home 9. Disposition- voiced concerns over financial issues and would benefit from SW/CM consultation.  10. Vascular access- she only has TDC and should have been referred for permanent access.  She will need to follow up with her Nephrologist at [facility].  Donetta Potts, MD [place] Pager [phone] 01/22/2019, 10:56 AM

## 2019-01-23 ENCOUNTER — Inpatient Hospital Stay (HOSPITAL_COMMUNITY)

## 2019-01-23 ENCOUNTER — Encounter (HOSPITAL_COMMUNITY): Payer: Self-pay

## 2019-01-23 HISTORY — PX: IR THORACENTESIS ASP PLEURAL SPACE W/IMG GUIDE: IMG5380

## 2019-01-23 LAB — PROTEIN, PLEURAL OR PERITONEAL FLUID: Total protein, fluid: <3 g/dL

## 2019-01-23 LAB — BODY FLUID CELL COUNT WITH DIFFERENTIAL
Eos, Fluid: 4 %
Lymphs, Fluid: 28 %
Monocyte-Macrophage-Serous Fluid: 58 % (ref 50–90)
Neutrophil Count, Fluid: 10 % (ref 0–25)
Total Nucleated Cell Count, Fluid: 97 cu mm (ref 0–1000)

## 2019-01-23 LAB — BASIC METABOLIC PANEL
Anion gap: 8 (ref 5–15)
BUN: 11 mg/dL (ref 6–20)
CO2: 26 mmol/L (ref 22–32)
Calcium: 6.7 mg/dL — ABNORMAL LOW (ref 8.9–10.3)
Chloride: 100 mmol/L (ref 98–111)
Creatinine, Ser: 2.67 mg/dL — ABNORMAL HIGH (ref 0.44–1.00)
GFR calc non Af Amer: 20 mL/min — ABNORMAL LOW (ref >60)
GFR, EST AFRICAN AMERICAN: 23 mL/min — AB (ref >60)
Glucose, Bld: 324 mg/dL — ABNORMAL HIGH (ref 70–99)
Potassium: 3.5 mmol/L (ref 3.5–5.1)
Sodium: 134 mmol/L — ABNORMAL LOW (ref 135–145)

## 2019-01-23 LAB — CBC
HCT: 30.8 % — ABNORMAL LOW (ref 36.0–46.0)
Hemoglobin: 9.2 g/dL — ABNORMAL LOW (ref 12.0–15.0)
MCH: 26 pg (ref 26.0–34.0)
MCHC: 29.9 g/dL — ABNORMAL LOW (ref 30.0–36.0)
MCV: 87 fL (ref 80.0–100.0)
NRBC: 0 % (ref 0.0–0.2)
Platelets: 130 10*3/uL — ABNORMAL LOW (ref 150–400)
RBC: 3.54 MIL/uL — ABNORMAL LOW (ref 3.87–5.11)
RDW: 16.3 % — ABNORMAL HIGH (ref 11.5–15.5)
WBC: 5.2 10*3/uL (ref 4.0–10.5)

## 2019-01-23 LAB — GLUCOSE, CAPILLARY
GLUCOSE-CAPILLARY: 171 mg/dL — AB (ref 70–99)
Glucose-Capillary: 327 mg/dL — ABNORMAL HIGH (ref 70–99)
Glucose-Capillary: 221 mg/dL — ABNORMAL HIGH (ref 70–99)
Glucose-Capillary: 132 mg/dL — ABNORMAL HIGH (ref 70–99)

## 2019-01-23 LAB — GRAM STAIN

## 2019-01-23 LAB — LACTATE DEHYDROGENASE, PLEURAL OR PERITONEAL FLUID: LD FL: 79 U/L — AB (ref 3–23)

## 2019-01-23 MED ORDER — INSULIN ASPART 100 UNIT/ML ~~LOC~~ SOLN
0.0000 [IU] | Freq: Three times a day (TID) | SUBCUTANEOUS | Status: DC
  Administered 2019-01-23: 2 [IU] via SUBCUTANEOUS
  Administered 2019-01-23 – 2019-01-24 (×3): 3 [IU] via SUBCUTANEOUS
  Administered 2019-01-25: 2 [IU] via SUBCUTANEOUS
  Administered 2019-01-25 (×2): 5 [IU] via SUBCUTANEOUS
  Administered 2019-01-26: 2 [IU] via SUBCUTANEOUS

## 2019-01-23 MED ORDER — LIDOCAINE HCL 1 % IJ SOLN
INTRAMUSCULAR | Status: DC | PRN
  Administered 2019-01-23: 10 mL

## 2019-01-23 MED ORDER — INSULIN GLARGINE 100 UNIT/ML ~~LOC~~ SOLN
6.0000 [IU] | Freq: Every day | SUBCUTANEOUS | Status: DC
  Administered 2019-01-23 – 2019-01-24 (×2): 6 [IU] via SUBCUTANEOUS
  Filled 2019-01-23 (×2): qty 0.06

## 2019-01-23 MED ORDER — INSULIN ASPART 100 UNIT/ML ~~LOC~~ SOLN
0.0000 [IU] | Freq: Every day | SUBCUTANEOUS | Status: DC
  Administered 2019-01-24: 3 [IU] via SUBCUTANEOUS

## 2019-01-23 MED ORDER — INSULIN ASPART 100 UNIT/ML ~~LOC~~ SOLN
3.0000 [IU] | Freq: Three times a day (TID) | SUBCUTANEOUS | Status: DC
  Administered 2019-01-23 – 2019-01-26 (×8): 3 [IU] via SUBCUTANEOUS

## 2019-01-23 MED ORDER — LIDOCAINE HCL 1 % IJ SOLN
INTRAMUSCULAR | Status: AC
  Filled 2019-01-23: qty 20

## 2019-01-23 NOTE — Progress Notes (Signed)
PROGRESS NOTE  Alyssa Floyd PRF:[id number] DOB: [birth year] DOA: 01/21/2019 PCP: Kirke Shaggy, MD  HPI/Recap of past 24 hours: 53yo w/ ESRD on HDTTS,DM, hypertension, and hyperlipidemia who presented from [facility] ED for volume overload w/ SOB.  In the ED her pulse ox was in the 80s. A CXR noted increased opacity at the right lung base since recent thoracentesis (12/19/2018), compatible with reaccumulation of pleural fluid.   01/23/2019: Patient seen and examined at her bedside.  Hypoglycemic overnight with CBG 17.  Reduced doses of Lantus and NovoLog.  Also reported watery stools, patient requests Imodium.  If persistent will obtain stool studies to rule out infective processes.   Assessment/Plan: Principal Problem:   Volume overload Active Problems:   [name], type 1 (HCC)   Hypertension   Pleural effusion   ESRD (end stage renal disease) on dialysis (HCC)   Acute respiratory failure (HCC)  Acute hypoxic respiratory failure suspect secondary to right pleural effusion versus volume overload in the setting of end-stage renal disease on dialysis Maintain O2 saturation greater than 92% Paracentesis done on 01/23/2019 by interventional radiology with 1.7 L of clear yellow fluid removed Follow cytology and fluid analysis  Hypoglycemia, iatrogenic in the setting of insulin use Caution with insulin stacking on end-stage renal disease patient Cutdown doses of Lantus and NovoLog Avoid hypoglycemia Obtain A1c, 9.2 on 01/22/2019  Right pleural effusion Status post right thoracentesis by IR Management as stated above  End-stage renal disease on dialysis Tuesday Thursday Saturday Continue dialysis Nephrology following  Seizure disorder Continue antiepileptic drugs    DVT prophylaxis: SQ heparin  Code Status: FULL CODE Family Communication: no family present at time of exam  Disposition Plan: SDU  Consultants:  Nephrology   Antimicrobials:  None     Objective: Vitals:   01/23/19 0727 01/23/19 1105 01/23/19 1115 01/23/19 1218  BP: (!) 157/86 (!) 158/87 (!) 158/87 133/81  Pulse: 86   87  Resp: 18   19  Temp: 98.4 F (36.9 C)   97.9 F (36.6 C)  TempSrc: Oral   Oral  SpO2: 97%   97%  Weight:      Height:        Intake/Output Summary (Last 24 hours) at 01/23/2019 1620 Last data filed at 01/23/2019 1115 Gross per 24 hour  Intake 630 ml  Output 2150 ml  Net -1520 ml   Filed Weights   01/21/19 2143 01/22/19 1025 01/22/19 1425  Weight: 75.4 kg 74.9 kg 70.9 kg    Exam:  . General: 53 y.o. year-old female well developed well nourished in no acute distress.  Alert and oriented x3. . Cardiovascular: Regular rate and rhythm with no rubs or gallops.  No thyromegaly or JVD noted.   Marland Kitchen Respiratory: Mild rales at bases with no wheezes.  Poor inspiratory effort. . Abdomen: Soft nontender nondistended with normal bowel sounds x4 quadrants. . Musculoskeletal: Trace lower extremity edema. 2/4 pulses in all 4 extremities. Marland Kitchen Psychiatry: Mood is appropriate for condition and setting   Data Reviewed: CBC: Recent Labs  Lab 01/22/19 0935 01/23/19 0242  WBC 6.1 5.2  HGB 9.3* 9.2*  HCT 31.4* 30.8*  MCV 88.0 87.0  PLT 177 935*   Basic Metabolic Panel: Recent Labs  Lab 01/22/19 0935 01/23/19 0242  NA 142  141 134*  K 3.7  3.6 3.5  CL 111  110 100  CO2 24  24 26   GLUCOSE 130*  130* 324*  BUN 25*  25* 11  CREATININE 4.22*  4.15* 2.67*  CALCIUM 6.5*  6.5* 6.7*  MG 1.9  --   PHOS 4.6  4.6  --    GFR: Estimated Creatinine Clearance: 24.4 mL/min (A) (by C-G formula based on SCr of 2.67 mg/dL (H)). Liver Function Tests: Recent Labs  Lab 01/22/19 0935  AST 17  ALT 15  ALKPHOS 89  BILITOT 0.6  PROT 5.1*  ALBUMIN 1.8*  1.8*   No results for input(s): LIPASE, AMYLASE in the last 168 hours. No results for input(s): AMMONIA in the last 168 hours. Coagulation Profile: No results for input(s): INR, PROTIME in the  last 168 hours. Cardiac Enzymes: No results for input(s): CKTOTAL, CKMB, CKMBINDEX, TROPONINI in the last 168 hours. BNP (last 3 results) No results for input(s): PROBNP in the last 8760 hours. HbA1C: Recent Labs    01/22/19 0257  HGBA1C 9.2*   CBG: Recent Labs  Lab 01/22/19 1530 01/22/19 1545 01/22/19 2006 01/23/19 0729 01/23/19 1216  GLUCAP 17* 147* 215* 327* 221*   Lipid Profile: No results for input(s): CHOL, HDL, LDLCALC, TRIG, CHOLHDL, LDLDIRECT in the last 72 hours. Thyroid Function Tests: No results for input(s): TSH, T4TOTAL, FREET4, T3FREE, THYROIDAB in the last 72 hours. Anemia Panel: No results for input(s): VITAMINB12, FOLATE, FERRITIN, TIBC, IRON, RETICCTPCT in the last 72 hours. Urine analysis:    Component Value Date/Time   COLORURINE STRAW (A) 09/07/2018 1041   APPEARANCEUR CLEAR (A) 09/07/2018 1041   LABSPEC 1.010 09/07/2018 1041   PHURINE 6.0 09/07/2018 1041   GLUCOSEU 150 (A) 09/07/2018 1041   HGBUR SMALL (A) 09/07/2018 1041   BILIRUBINUR NEGATIVE 09/07/2018 1041   KETONESUR NEGATIVE 09/07/2018 1041   PROTEINUR 100 (A) 09/07/2018 1041   NITRITE NEGATIVE 09/07/2018 1041   LEUKOCYTESUR TRACE (A) 09/07/2018 1041   Sepsis Labs: @LABRCNTIP (procalcitonin:4,lacticidven:4)  ) Recent Results (from the past 240 hour(s))  MRSA PCR Screening     Status: None   Collection Time: 01/22/19 12:32 AM  Result Value Ref Range Status   MRSA by PCR NEGATIVE NEGATIVE Final    Comment:        The GeneXpert MRSA Assay (FDA approved for NASAL specimens only), is one component of a comprehensive MRSA colonization surveillance program. It is not intended to diagnose MRSA infection nor to guide or monitor treatment for MRSA infections. Performed at [facility], [place] [address], [place], [place] [address]   Gram stain     Status: None   Collection Time: 01/23/19 11:24 AM  Result Value Ref Range Status   Specimen Description PLEURAL RIGHT  Final   Special  Requests NONE  Final   Gram Stain   Final    MODERATE WBC PRESENT, PREDOMINANTLY MONONUCLEAR NO ORGANISMS SEEN Performed at [facility], [place] [address], [place], [place] [address]    Report Status 01/23/2019 FINAL  Final      Studies: Dg Chest 1 View  Result Date: 01/23/2019 CLINICAL DATA:  Status post thoracentesis. EXAM: CHEST  1 VIEW COMPARISON:  01/23/2019 at 0643 hours FINDINGS: Right jugular dialysis catheter and left jugular Port-A-Cath or unchanged. The cardiac silhouette remains enlarged. There is a small right pleural effusion, much smaller than on the earlier study following interval thoracentesis. There is improved aeration of the right lung. A small left pleural effusion has not significantly changed. No pneumothorax is identified. IMPRESSION: Decreased size of right pleural effusion following thoracentesis. No pneumothorax identified. Electronically Signed   By: Logan Bores M.D.   On: 01/23/2019 12:27  Dg Chest Port 1 View  Result Date: 01/23/2019 CLINICAL DATA:  Sob,pleural effusion,hx dialysis EXAM: PORTABLE CHEST 1 VIEW COMPARISON:  Chest radiograph 01/21/2019 FINDINGS: Stable enlarged cardiac silhouette. Port and central venous line unchanged. Large RIGHT effusion and basilar atelectasis unchanged. Small LEFT effusion slightly increased. Upper lungs clear. IMPRESSION: 1. Stable support apparatus. 2. Stable large RIGHT effusion with atelectasis. 3. Increase in small LEFT effusion. Electronically Signed   By: Suzy Bouchard M.D.   On: 01/23/2019 09:55   Ir Thoracentesis Asp Pleural Space W/img Guide  Result Date: 01/23/2019 INDICATION: Shortness of breath. Renal insufficiency. Right-sided pleural effusion. Request for diagnostic and therapeutic thoracentesis. EXAM: ULTRASOUND GUIDED RIGHT THORACENTESIS MEDICATIONS: None. COMPLICATIONS: None immediate. PROCEDURE: An ultrasound guided thoracentesis was thoroughly discussed with the patient and questions answered. The  benefits, risks, alternatives and complications were also discussed. The patient understands and wishes to proceed with the procedure. Written consent was obtained. Ultrasound was performed to localize and mark an adequate pocket of fluid in the right chest. The area was then prepped and draped in the normal sterile fashion. 1% Lidocaine was used for local anesthesia. Under ultrasound guidance a 6 Fr Safe-T-Centesis catheter was introduced. Thoracentesis was performed. The catheter was removed and a dressing applied. FINDINGS: A total of approximately 1.7 L of clear yellow fluid was removed. Samples were sent to the laboratory as requested by the clinical team. IMPRESSION: Successful ultrasound guided right thoracentesis yielding 1.7 L of pleural fluid. Read by: Ascencion Dike PA-C Electronically Signed   By: Jacqulynn Cadet M.D.   On: 01/23/2019 11:41    Scheduled Meds: . amLODipine  10 mg Oral Daily  . atorvastatin  20 mg Oral Daily  . calcitRIOL  0.5 mcg Oral Daily  . calcium acetate  667 mg Oral TID WC  . Chlorhexidine Gluconate Cloth  6 each Topical Q0600  . cholecalciferol  2,000 Units Oral Daily  . ferrous sulfate  325 mg Oral Daily  . heparin  5,000 Units Subcutaneous Q8H  . insulin aspart  0-5 Units Subcutaneous QHS  . insulin aspart  0-9 Units Subcutaneous TID WC  . insulin aspart  3 Units Subcutaneous TID WC  . insulin glargine  6 Units Subcutaneous Daily  . levETIRAcetam  1,000 mg Oral Daily  . lidocaine      . tamoxifen  20 mg Oral Daily    Continuous Infusions:   LOS: 1 day     Kayleen Memos, MD [facility] Pager [phone]  If 7PM-7AM, please contact night-coverage [email or web address] Password [email or web address] 01/23/2019, 4:20 PM

## 2019-01-23 NOTE — Progress Notes (Signed)
Patient ID: Alyssa Floyd, female   DOB: [birth year], 53 y.o.   MRN: [id number] S: feels much better today.  No sob O:BP (!) 158/87 (BP Location: Left Arm) Comment: post-thoracentesis   Pulse 86   Temp 98.4 F (36.9 C) (Oral)   Resp 18   Ht 5\' 5"  (1.651 m)   Wt 70.9 kg   SpO2 97%   BMI 26.01 kg/m   Intake/Output Summary (Last 24 hours) at 01/23/2019 1154 Last data filed at 01/23/2019 1115 Gross per 24 hour  Intake 240 ml  Output 6150 ml  Net -5910 ml   Intake/Output: I/O last 3 completed shifts: In: 240 [P.O.:240] Out: 8119 [Urine:850; Other:4000]  Intake/Output this shift:  Total I/O In: -  Out: 1700 [Other:1700] Weight change: -0.5 kg Gen: NAD CVS: no rub Resp: cta Abd: +BS, soft, NT/ND Ext: no edema  Recent Labs  Lab 01/22/19 0935 01/23/19 0242  NA 142  141 134*  K 3.7  3.6 3.5  CL 111  110 100  CO2 24  24 26   GLUCOSE 130*  130* 324*  BUN 25*  25* 11  CREATININE 4.22*  4.15* 2.67*  ALBUMIN 1.8*  1.8*  --   CALCIUM 6.5*  6.5* 6.7*  PHOS 4.6  4.6  --   AST 17  --   ALT 15  --    Liver Function Tests: Recent Labs  Lab 01/22/19 0935  AST 17  ALT 15  ALKPHOS 89  BILITOT 0.6  PROT 5.1*  ALBUMIN 1.8*  1.8*   No results for input(s): LIPASE, AMYLASE in the last 168 hours. No results for input(s): AMMONIA in the last 168 hours. CBC: Recent Labs  Lab 01/22/19 0935 01/23/19 0242  WBC 6.1 5.2  HGB 9.3* 9.2*  HCT 31.4* 30.8*  MCV 88.0 87.0  PLT 177 130*   Cardiac Enzymes: No results for input(s): CKTOTAL, CKMB, CKMBINDEX, TROPONINI in the last 168 hours. CBG: Recent Labs  Lab 01/22/19 0800 01/22/19 1530 01/22/19 1545 01/22/19 2006 01/23/19 0729  GLUCAP 145* 17* 147* 215* 327*    Iron Studies: No results for input(s): IRON, TIBC, TRANSFERRIN, FERRITIN in the last 72 hours. Studies/Results: Dg Chest Port 1 View  Result Date: 01/23/2019 CLINICAL DATA:  Sob,pleural effusion,hx dialysis EXAM: PORTABLE CHEST 1 VIEW COMPARISON:  Chest  radiograph 01/21/2019 FINDINGS: Stable enlarged cardiac silhouette. Port and central venous line unchanged. Large RIGHT effusion and basilar atelectasis unchanged. Small LEFT effusion slightly increased. Upper lungs clear. IMPRESSION: 1. Stable support apparatus. 2. Stable large RIGHT effusion with atelectasis. 3. Increase in small LEFT effusion. Electronically Signed   By: Suzy Bouchard M.D.   On: 01/23/2019 09:55   . amLODipine  10 mg Oral Daily  . atorvastatin  20 mg Oral Daily  . calcitRIOL  0.5 mcg Oral Daily  . calcium acetate  667 mg Oral TID WC  . Chlorhexidine Gluconate Cloth  6 each Topical Q0600  . cholecalciferol  2,000 Units Oral Daily  . ferrous sulfate  325 mg Oral Daily  . heparin  5,000 Units Subcutaneous Q8H  . insulin aspart  0-5 Units Subcutaneous QHS  . insulin aspart  0-9 Units Subcutaneous TID WC  . insulin aspart  3 Units Subcutaneous TID WC  . insulin glargine  6 Units Subcutaneous Daily  . levETIRAcetam  1,000 mg Oral Daily  . lidocaine      . tamoxifen  20 mg Oral Daily    BMET    Component Value Date/Time  NA 134 (L) 01/23/2019 0242   K 3.5 01/23/2019 0242   CL 100 01/23/2019 0242   CO2 26 01/23/2019 0242   GLUCOSE 324 (H) 01/23/2019 0242   BUN 11 01/23/2019 0242   CREATININE 2.67 (H) 01/23/2019 0242   CALCIUM 6.7 (L) 01/23/2019 0242   GFRNONAA 20 (L) 01/23/2019 0242   GFRAA 23 (L) 01/23/2019 0242   CBC    Component Value Date/Time   WBC 5.2 01/23/2019 0242   RBC 3.54 (L) 01/23/2019 0242   HGB 9.2 (L) 01/23/2019 0242   HCT 30.8 (L) 01/23/2019 0242   PLT 130 (L) 01/23/2019 0242   MCV 87.0 01/23/2019 0242   MCH 26.0 01/23/2019 0242   MCHC 29.9 (L) 01/23/2019 0242   RDW 16.3 (H) 01/23/2019 0242   LYMPHSABS 0.8 12/20/2018 0355   MONOABS 0.7 12/20/2018 0355   EOSABS 0.3 12/20/2018 0355   BASOSABS 0.0 12/20/2018 0355   Dialysis Orders: Center: [facility]  on TTS. EDW 73.5kg HD Bath 3K/2.5Ca  Time 4 hrs Heparin 2000. Access RIJ tdc BFR 400 DFR 800    Micera 246mcg IV every 2 weeks, calcitriol 0.40mcg po qTTS  Assessment/Plan: 1.  Hypoxic respiratory failure- presumably due to volume overload in setting of ESRD and weight loss.  Markedly improved with HD and UF and is well below her outpatient edw of 73.5kg.    2. plueral effusions- s/p diagnostic thoracentesis by IR.  Plan per primary. 3.  ESRD -  Continue with HD qTTS.  Will plan for HD tomorrow if she remains an inpatient.  New edw will be closer to 70kg. 4.  Hypertension/volume  -  As above, will challenge edw and follow respiratory status 5.  Anemia  -  On micera as an outpatient. 6.  Metabolic bone disease -   Continue with meds 7.  Nutrition -  Renal diet and will need calorie count to make sure she is getting enough nutrition. 8. Pleural effusions- plan for CT scan for further workup per primary svc.  9. Elevated AST- unclear if she is drinking etoh at home 10. Disposition- voiced concerns over financial issues and would benefit from SW/CM consultation.  11. Vascular access- she only has TDC and should have been referred for permanent access.  She will need to follow up with her Nephrologist at [facility]  Donetta Potts, MD [employer] [phone]

## 2019-01-23 NOTE — Procedures (Signed)
PROCEDURE SUMMARY:  Successful US guided right thoracentesis. Yielded 1.7 L of clear yellow fluid. Pt tolerated procedure well. No immediate complications.  Specimen was sent for labs. CXR ordered.  EBL < 5 mL  Ascencion Dike PA-C 01/23/2019 11:24 AM

## 2019-01-23 NOTE — Progress Notes (Addendum)
Inpatient Diabetes Program Recommendations  AACE/ADA: New Consensus Statement on Inpatient Glycemic Control (2015)  Target Ranges:  Prepandial:   less than 140 mg/dL      Peak postprandial:   less than 180 mg/dL (1-2 hours)      Critically ill patients:  140 - 180 mg/dL   Lab Results  Component Value Date   GLUCAP 327 (H) 01/23/2019   HGBA1C 9.2 (H) 01/22/2019    Review of Glycemic Control  Diabetes history: DM 1, requires basal insulin Outpatient Diabetes medications: Lantus 14 Daily, Novolog 4 units tid with meals Current orders for Inpatient glycemic control: Novolog 0-9 units tid, Novolog 0-5 units qhs  Inpatient Diabetes Program Recommendations:     Patient with hypoglycemia yesterday around 3pm. Patient received correction scale and meal coverage, unsure if patient consumed at least half of breakfast as meal percentage is not documented. Patient went to HD yesterday.  Patient ordered Novolog Sensitive correction. Glucose this am in the 300's. Consider ordering Lantus 7 units (half of home dose). Consider reducing the amount of meal coverage to Novolog 2 units tid only if patient consumes at least 50% of meals.  Sent a secure chat message to Dr. [place] Crane.  Thanks,  Tama Headings RN, MSN, BC-ADM Inpatient Diabetes Coordinator Team Pager [phone] (8a-5p)

## 2019-01-24 DIAGNOSIS — I10 Essential (primary) hypertension: Secondary | ICD-10-CM

## 2019-01-24 LAB — GLUCOSE, CAPILLARY
GLUCOSE-CAPILLARY: 223 mg/dL — AB (ref 70–99)
GLUCOSE-CAPILLARY: 240 mg/dL — AB (ref 70–99)
Glucose-Capillary: 244 mg/dL — ABNORMAL HIGH (ref 70–99)
Glucose-Capillary: 228 mg/dL — ABNORMAL HIGH (ref 70–99)
Glucose-Capillary: 281 mg/dL — ABNORMAL HIGH (ref 70–99)

## 2019-01-24 LAB — RENAL FUNCTION PANEL
Albumin: 1.5 g/dL — ABNORMAL LOW (ref 3.5–5.0)
Anion gap: 8 (ref 5–15)
BUN: 22 mg/dL — ABNORMAL HIGH (ref 6–20)
CO2: 25 mmol/L (ref 22–32)
Calcium: 7 mg/dL — ABNORMAL LOW (ref 8.9–10.3)
Chloride: 104 mmol/L (ref 98–111)
Creatinine, Ser: 4.25 mg/dL — ABNORMAL HIGH (ref 0.44–1.00)
GFR calc non Af Amer: 11 mL/min — ABNORMAL LOW (ref >60)
GFR, EST AFRICAN AMERICAN: 13 mL/min — AB (ref >60)
Glucose, Bld: 345 mg/dL — ABNORMAL HIGH (ref 70–99)
Phosphorus: 4.3 mg/dL (ref 2.5–4.6)
Potassium: 3.5 mmol/L (ref 3.5–5.1)
Sodium: 137 mmol/L (ref 135–145)

## 2019-01-24 LAB — CBC
HCT: 33.7 % — ABNORMAL LOW (ref 36.0–46.0)
Hemoglobin: 9.9 g/dL — ABNORMAL LOW (ref 12.0–15.0)
MCH: 26 pg (ref 26.0–34.0)
MCHC: 29.4 g/dL — ABNORMAL LOW (ref 30.0–36.0)
MCV: 88.5 fL (ref 80.0–100.0)
NRBC: 0 % (ref 0.0–0.2)
Platelets: 175 10*3/uL (ref 150–400)
RBC: 3.81 MIL/uL — AB (ref 3.87–5.11)
RDW: 15.6 % — ABNORMAL HIGH (ref 11.5–15.5)
WBC: 5.5 10*3/uL (ref 4.0–10.5)

## 2019-01-24 LAB — PH, BODY FLUID: pH, Body Fluid: 7.9

## 2019-01-24 MED ORDER — LOPERAMIDE HCL 2 MG PO CAPS
2.0000 mg | ORAL_CAPSULE | Freq: Three times a day (TID) | ORAL | Status: DC | PRN
  Administered 2019-01-24 – 2019-01-26 (×2): 2 mg via ORAL
  Filled 2019-01-24 (×2): qty 1

## 2019-01-24 MED ORDER — HEPARIN SODIUM (PORCINE) 1000 UNIT/ML IJ SOLN
INTRAMUSCULAR | Status: AC
  Filled 2019-01-24: qty 2

## 2019-01-24 MED ORDER — TRAZODONE HCL 50 MG PO TABS
50.0000 mg | ORAL_TABLET | Freq: Every evening | ORAL | Status: DC | PRN
  Administered 2019-01-25: 50 mg via ORAL
  Filled 2019-01-24: qty 1

## 2019-01-24 MED ORDER — HEPARIN SODIUM (PORCINE) 1000 UNIT/ML DIALYSIS
20.0000 [IU]/kg | INTRAMUSCULAR | Status: DC | PRN
  Administered 2019-01-24: 1400 [IU] via INTRAVENOUS_CENTRAL
  Filled 2019-01-24: qty 1.4

## 2019-01-24 MED ORDER — INSULIN GLARGINE 100 UNIT/ML ~~LOC~~ SOLN
10.0000 [IU] | Freq: Every day | SUBCUTANEOUS | Status: DC
  Administered 2019-01-25 – 2019-01-26 (×2): 10 [IU] via SUBCUTANEOUS
  Filled 2019-01-24 (×2): qty 0.1

## 2019-01-24 MED ORDER — CALCITRIOL 0.5 MCG PO CAPS
ORAL_CAPSULE | ORAL | Status: AC
  Filled 2019-01-24: qty 1

## 2019-01-24 NOTE — Progress Notes (Signed)
Inpatient Diabetes Program Recommendations  AACE/ADA: New Consensus Statement on Inpatient Glycemic Control (2015)  Target Ranges:  Prepandial:   less than 140 mg/dL      Peak postprandial:   less than 180 mg/dL (1-2 hours)      Critically ill patients:  140 - 180 mg/dL   Results for FARRIN, SHADLE (MRN [id number]) as of 01/24/2019 09:51  Ref. Range 01/23/2019 07:29 01/23/2019 12:16 01/23/2019 16:36 01/23/2019 20:26 01/24/2019 08:38  Glucose-Capillary Latest Ref Range: 70 - 99 mg/dL 327 (H) 221 (H) 171 (H) 132 (H) 244 (H)   Review of Glycemic Control  Diabetes history: DM 1, requires basal insulin Outpatient Diabetes medications: Lantus 14 Daily, Novolog 4 units tid with meals Current orders for Inpatient glycemic control: Novolog 0-9 units tid, Novolog 0-5 units qhs  Inpatient Diabetes Program Recommendations:     Fasting glucose 244 this am. Consider increasing basal insulin to Lantus 10 units.  Thanks,  Tama Headings RN, MSN, BC-ADM Inpatient Diabetes Coordinator Team Pager [phone] (8a-5p)

## 2019-01-24 NOTE — Procedures (Signed)
I was present at this dialysis session. I have reviewed the session itself and made appropriate changes.   Vital signs in last 24 hours:  Temp:  [97.9 F (36.6 C)-98.3 F (36.8 C)] 98.3 F (36.8 C) (02/27 0712) Pulse Rate:  [78-91] 91 (02/27 0830) Resp:  [13-25] 19 (02/27 0719) BP: (128-167)/(71-97) 157/96 (02/27 0830) SpO2:  [91 %-97 %] 97 % (02/27 0719) Weight:  [68.7 kg] 68.7 kg (02/27 0712) Weight change:  Filed Weights   01/22/19 1025 01/22/19 1425 01/24/19 0712  Weight: 74.9 kg 70.9 kg 68.7 kg    Recent Labs  Lab 01/22/19 0935 01/23/19 0242  NA 142  141 134*  K 3.7  3.6 3.5  CL 111  110 100  CO2 24  24 26   GLUCOSE 130*  130* 324*  BUN 25*  25* 11  CREATININE 4.22*  4.15* 2.67*  CALCIUM 6.5*  6.5* 6.7*  PHOS 4.6  4.6  --     Recent Labs  Lab 01/22/19 0935 01/23/19 0242 01/24/19 0819  WBC 6.1 5.2 5.5  HGB 9.3* 9.2* 9.9*  HCT 31.4* 30.8* 33.7*  MCV 88.0 87.0 88.5  PLT 177 130* 175    Scheduled Meds: . amLODipine  10 mg Oral Daily  . atorvastatin  20 mg Oral Daily  . calcitRIOL  0.5 mcg Oral Daily  . calcium acetate  667 mg Oral TID WC  . Chlorhexidine Gluconate Cloth  6 each Topical Q0600  . cholecalciferol  2,000 Units Oral Daily  . ferrous sulfate  325 mg Oral Daily  . heparin  5,000 Units Subcutaneous Q8H  . insulin aspart  0-5 Units Subcutaneous QHS  . insulin aspart  0-9 Units Subcutaneous TID WC  . insulin aspart  3 Units Subcutaneous TID WC  . insulin glargine  6 Units Subcutaneous Daily  . levETIRAcetam  1,000 mg Oral Daily  . tamoxifen  20 mg Oral Daily   Continuous Infusions: PRN Meds:.albuterol, benzonatate, heparin, hydrALAZINE, lidocaine    Dialysis Orders: Center:[facility] T[facility]. [id number] Bath 3K/2.5CaTime 4 hrsHeparin 2000. AccessRIJ tdcBFR 400DFR 800 Micera 263mcg IV every 2 weeks, calcitriol 0.14mcg po qTTS  Assessment/Plan: 1. Hypoxic respiratory failure- due to volume overload in setting of ESRD and weight  loss. Markedly improved with HD and UF and is well below her outpatient edw of 73.5kg.    2. plueral effusions- s/p diagnostic thoracentesis by IR.  Plan per primary. 3. ESRD- Continue with HD qTTS.  Will plan for HD tomorrow if she remains an inpatient.  New edw will be closer to 67kg.  Will check post HD weight today and set as new edw. 4. Hypertension/volume- BP better with UF and challenging edw.  She is 6kg below previous edw.   5. Anemia- On micera as an outpatient. 6. Metabolic bone disease- Continue with meds 7. Nutrition- Renal diet and will need calorie count to make sure she is getting enough nutrition. 8. Pleural effusions- plan for CT scan for further workup per primary svc.  9. Elevated AST- unclear if she is drinking etoh at home 10. Disposition- voiced concerns over financial issues and would benefit from SW/CM consultation.  11. Vascular access- she only has TDC and should have been referred for permanent access. She will need to follow up with her Nephrologist at [facility].  Donetta Potts,  MD 01/24/2019, 8:37 AM

## 2019-01-24 NOTE — Progress Notes (Signed)
PROGRESS NOTE    Alyssa Floyd  NWG:[id number] DOB: [birth year] DOA: 01/21/2019 PCP: Kirke Shaggy, MD   Brief Narrative:  53 year old with history of ESRD on hemodialysis Tuesday Thursday Saturday, diabetes mellitus type 2, essential hypertension, hyperlipidemia came to the hospital initially at [facility] with complains of shortness of breath.  She was found to be fluid overloaded.  Chest x-ray was suggestive of increasing opacity in the right lung base.  She was also hypoxic saturating 80%.  She underwent thoracentesis on 2/26 and about 1.7 L of fluid was removed.  Fluid was sent for cytology analysis.  Nephrology consulted for hemodialysis.   Assessment & Plan:   Principal Problem:   Volume overload Active Problems:   [name], type 1 (HCC)   Hypertension   Pleural effusion   ESRD (end stage renal disease) on dialysis (HCC)   Acute respiratory failure (HCC)  Acute hypoxic respiratory failure secondary to right-sided pleural effusion -There is suspicion that not enough volume is being removed during her outpatient dialysis?.  Status post 1.7 L fluid removed by IR 2/26.  Follow-up cytology. -Supplemental oxygen.  Still requiring 2-3 L nasal cannula.  Hopefully she will do better after her dialysis session today.  Symptomatic hypoglycemia; imporoved  Hx of DM2, insulin dependent. Uncontrolled.  - Secondary to insulin use in the setting of end-stage renal disease.  Hemoglobin A1c 9.2.  Will need to be adjusted.  Diabetic coordinator consulted.  ESRD on hemodialysis Tuesday Thursday Saturday -This morning patient seen on hemodialysis.  Nephrology team following.  History of seizure disorder -Resume home meds  DVT prophylaxis: Subcutaneous insulin Code Status: Full code Family Communication: None Disposition Plan: We will transfer patient to [place].  Hopefully we can discharge 4-48 hours when she is able to be weaned off oxygen.  Consultants:   Nephrology  Procedures:    Thoracentesis 2/26  Antimicrobials:   None   Subjective: Feels a little better after thoracentesis yesterday.  Still exertional shortness of breath.  Review of Systems Otherwise negative except as per HPI, including: General: Denies fever, chills, night sweats or unintended weight loss. Resp: Denies cough, wheezing Cardiac: Denies chest pain, palpitations, orthopnea, paroxysmal nocturnal dyspnea. GI: Denies abdominal pain, nausea, vomiting, diarrhea or constipation GU: Denies dysuria, frequency, hesitancy or incontinence MS: Denies muscle aches, joint pain or swelling Neuro: Denies headache, neurologic deficits (focal weakness, numbness, tingling), abnormal gait Psych: Denies anxiety, depression, SI/HI/AVH Skin: Denies new rashes or lesions ID: Denies sick contacts, exotic exposures, travel  Objective: Vitals:   01/24/19 1000 01/24/19 1030 01/24/19 1100 01/24/19 1119  BP: 139/82 132/79 139/75 (!) 146/87  Pulse: 79 82 83 83  Resp:    17  Temp:    98.3 F (36.8 C)  TempSrc:    Oral  SpO2:    99%  Weight:      Height:        Intake/Output Summary (Last 24 hours) at 01/24/2019 1416 Last data filed at 01/24/2019 1119 Gross per 24 hour  Intake -  Output 3002 ml  Net -3002 ml   Filed Weights   01/22/19 1025 01/22/19 1425 01/24/19 0712  Weight: 74.9 kg 70.9 kg 68.7 kg    Examination:  General exam: Appears calm and comfortable ; 2L [place] Respiratory system: Clear to auscultation. Respiratory effort normal. Cardiovascular system: S1 & S2 heard, RRR. No JVD, murmurs, rubs, gallops or clicks. No pedal edema. Gastrointestinal system: Abdomen is nondistended, soft and nontender. No organomegaly or masses felt. Normal bowel sounds heard.  Central nervous system: Alert and oriented. No focal neurological deficits. Extremities: Symmetric 4+ x 5 power. Skin: No rashes, lesions or ulcers Psychiatry: Judgement and insight appear normal. Mood & affect appropriate.     Data  Reviewed:   CBC: Recent Labs  Lab 01/22/19 0935 01/23/19 0242 01/24/19 0819  WBC 6.1 5.2 5.5  HGB 9.3* 9.2* 9.9*  HCT 31.4* 30.8* 33.7*  MCV 88.0 87.0 88.5  PLT 177 130* 458   Basic Metabolic Panel: Recent Labs  Lab 01/22/19 0935 01/23/19 0242 01/24/19 0818  NA 142  141 134* 137  K 3.7  3.6 3.5 3.5  CL 111  110 100 104  CO2 24  24 26 25   GLUCOSE 130*  130* 324* 345*  BUN 25*  25* 11 22*  CREATININE 4.22*  4.15* 2.67* 4.25*  CALCIUM 6.5*  6.5* 6.7* 7.0*  MG 1.9  --   --   PHOS 4.6  4.6  --  4.3   GFR: Estimated Creatinine Clearance: 15.1 mL/min (A) (by C-G formula based on SCr of 4.25 mg/dL (H)). Liver Function Tests: Recent Labs  Lab 01/22/19 0935 01/24/19 0818  AST 17  --   ALT 15  --   ALKPHOS 89  --   BILITOT 0.6  --   PROT 5.1*  --   ALBUMIN 1.8*  1.8* 1.5*   No results for input(s): LIPASE, AMYLASE in the last 168 hours. No results for input(s): AMMONIA in the last 168 hours. Coagulation Profile: No results for input(s): INR, PROTIME in the last 168 hours. Cardiac Enzymes: No results for input(s): CKTOTAL, CKMB, CKMBINDEX, TROPONINI in the last 168 hours. BNP (last 3 results) No results for input(s): PROBNP in the last 8760 hours. HbA1C: Recent Labs    01/22/19 0257  HGBA1C 9.2*   CBG: Recent Labs  Lab 01/23/19 1216 01/23/19 1636 01/23/19 2026 01/24/19 0838 01/24/19 1205  GLUCAP 221* 171* 132* 244* 223*   Lipid Profile: No results for input(s): CHOL, HDL, LDLCALC, TRIG, CHOLHDL, LDLDIRECT in the last 72 hours. Thyroid Function Tests: No results for input(s): TSH, T4TOTAL, FREET4, T3FREE, THYROIDAB in the last 72 hours. Anemia Panel: No results for input(s): VITAMINB12, FOLATE, FERRITIN, TIBC, IRON, RETICCTPCT in the last 72 hours. Sepsis Labs: No results for input(s): PROCALCITON, LATICACIDVEN in the last 168 hours.  Recent Results (from the past 240 hour(s))  MRSA PCR Screening     Status: None   Collection Time: 01/22/19  12:32 AM  Result Value Ref Range Status   MRSA by PCR NEGATIVE NEGATIVE Final    Comment:        The GeneXpert MRSA Assay (FDA approved for NASAL specimens only), is one component of a comprehensive MRSA colonization surveillance program. It is not intended to diagnose MRSA infection nor to guide or monitor treatment for MRSA infections. Performed at [facility], [place] [address], [place], [place] [address]   Gram stain     Status: None   Collection Time: 01/23/19 11:24 AM  Result Value Ref Range Status   Specimen Description PLEURAL RIGHT  Final   Special Requests NONE  Final   Gram Stain   Final    MODERATE WBC PRESENT, PREDOMINANTLY MONONUCLEAR NO ORGANISMS SEEN Performed at [facility], [place] [address] [place], [place] [address]    Report Status 01/23/2019 FINAL  Final  Culture, body fluid-bottle     Status: None (Preliminary result)   Collection Time: 01/23/19 11:24 AM  Result Value Ref  Range Status   Specimen Description PLEURAL RIGHT  Final   Special Requests NONE  Final   Culture   Final    NO GROWTH 1 DAY Performed at [facility] [address], [place], [place] [address]    Report Status PENDING  Incomplete         Radiology Studies: Dg Chest 1 View  Result Date: 01/23/2019 CLINICAL DATA:  Status post thoracentesis. EXAM: CHEST  1 VIEW COMPARISON:  01/23/2019 at 0643 hours FINDINGS: Right jugular dialysis catheter and left jugular Port-A-Cath or unchanged. The cardiac silhouette remains enlarged. There is a small right pleural effusion, much smaller than on the earlier study following interval thoracentesis. There is improved aeration of the right lung. A small left pleural effusion has not significantly changed. No pneumothorax is identified. IMPRESSION: Decreased size of right pleural effusion following thoracentesis. No pneumothorax identified. Electronically Signed   By: Logan Bores M.D.   On: 01/23/2019 12:27   Dg Chest Port 1  View  Result Date: 01/23/2019 CLINICAL DATA:  Sob,pleural effusion,hx dialysis EXAM: PORTABLE CHEST 1 VIEW COMPARISON:  Chest radiograph 01/21/2019 FINDINGS: Stable enlarged cardiac silhouette. Port and central venous line unchanged. Large RIGHT effusion and basilar atelectasis unchanged. Small LEFT effusion slightly increased. Upper lungs clear. IMPRESSION: 1. Stable support apparatus. 2. Stable large RIGHT effusion with atelectasis. 3. Increase in small LEFT effusion. Electronically Signed   By: Suzy Bouchard M.D.   On: 01/23/2019 09:55   Ir Thoracentesis Asp Pleural Space W/img Guide  Result Date: 01/23/2019 INDICATION: Shortness of breath. Renal insufficiency. Right-sided pleural effusion. Request for diagnostic and therapeutic thoracentesis. EXAM: ULTRASOUND GUIDED RIGHT THORACENTESIS MEDICATIONS: None. COMPLICATIONS: None immediate. PROCEDURE: An ultrasound guided thoracentesis was thoroughly discussed with the patient and questions answered. The benefits, risks, alternatives and complications were also discussed. The patient understands and wishes to proceed with the procedure. Written consent was obtained. Ultrasound was performed to localize and mark an adequate pocket of fluid in the right chest. The area was then prepped and draped in the normal sterile fashion. 1% Lidocaine was used for local anesthesia. Under ultrasound guidance a 6 Fr Safe-T-Centesis catheter was introduced. Thoracentesis was performed. The catheter was removed and a dressing applied. FINDINGS: A total of approximately 1.7 L of clear yellow fluid was removed. Samples were sent to the laboratory as requested by the clinical team. IMPRESSION: Successful ultrasound guided right thoracentesis yielding 1.7 L of pleural fluid. Read by: Ascencion Dike PA-C Electronically Signed   By: Jacqulynn Cadet M.D.   On: 01/23/2019 11:41        Scheduled Meds: . amLODipine  10 mg Oral Daily  . atorvastatin  20 mg Oral Daily  .  calcitRIOL  0.5 mcg Oral Daily  . calcium acetate  667 mg Oral TID WC  . Chlorhexidine Gluconate Cloth  6 each Topical Q0600  . cholecalciferol  2,000 Units Oral Daily  . ferrous sulfate  325 mg Oral Daily  . heparin  5,000 Units Subcutaneous Q8H  . insulin aspart  0-5 Units Subcutaneous QHS  . insulin aspart  0-9 Units Subcutaneous TID WC  . insulin aspart  3 Units Subcutaneous TID WC  . insulin glargine  6 Units Subcutaneous Daily  . levETIRAcetam  1,000 mg Oral Daily  . tamoxifen  20 mg Oral Daily   Continuous Infusions:   LOS: 2 days   Time spent= 25 mins    Sheela Mcculley Arsenio Loader, MD [facility]  If 7PM-7AM, please contact  night-coverage [email or web address] 01/24/2019, 2:16 PM

## 2019-01-25 ENCOUNTER — Inpatient Hospital Stay (HOSPITAL_COMMUNITY)

## 2019-01-25 ENCOUNTER — Encounter (HOSPITAL_COMMUNITY): Payer: Self-pay | Admitting: *Deleted

## 2019-01-25 DIAGNOSIS — I340 Nonrheumatic mitral (valve) insufficiency: Secondary | ICD-10-CM

## 2019-01-25 LAB — GLUCOSE, CAPILLARY
Glucose-Capillary: 275 mg/dL — ABNORMAL HIGH (ref 70–99)
Glucose-Capillary: 276 mg/dL — ABNORMAL HIGH (ref 70–99)
Glucose-Capillary: 174 mg/dL — ABNORMAL HIGH (ref 70–99)

## 2019-01-25 LAB — ECHOCARDIOGRAM COMPLETE
Height: 65 in
Weight: 2423.3 oz

## 2019-01-25 MED ORDER — BOOST / RESOURCE BREEZE PO LIQD CUSTOM
1.0000 | Freq: Two times a day (BID) | ORAL | Status: DC
  Administered 2019-01-25: 1 via ORAL

## 2019-01-25 NOTE — Progress Notes (Signed)
Initial Nutrition Assessment  DOCUMENTATION CODES:   Not applicable  INTERVENTION:    Boost Breeze po BID, each supplement provides 250 kcal and 9 grams of protein  NUTRITION DIAGNOSIS:   Increased nutrient needs related to chronic illness as evidenced by estimated needs  GOAL:   Patient will meet greater than or equal to 90% of their needs  MONITOR:   PO intake, Supplement acceptance, Labs, Skin, Weight trends  REASON FOR ASSESSMENT:   Consult Assessment of nutrition requirement/status  ASSESSMENT:   53 yo Female with PMH of DM type 2; presented with shortness of breath.  She was found to be fluid overloaded.  Chest x-ray was suggestive of increasing opacity in the right lung base.  Pt s/p procedure 2/26: IR THORACENTESIS ASP PLEURAL SPACE   RD spoke with pt at bedside. Reports her appetite is fine; didn't care for her lunch today. Pt does not drink Nepro Shakes; states they upset her stomach.  Pt receives HD on Tuesday, Thursday and Saturday. She is followed by an outpatient RD at her dialysis center. States she's lost 8 lbs in the last week; likely fluid loss.  Labs & medications reviewed. CBG's [phone].  Renal/Carbohydrate Modified diet provides approximately 1800 kcals and 85 gm protein.  NUTRITION - FOCUSED PHYSICAL EXAM:    Most Recent Value  Orbital Region  No depletion  Upper Arm Region  No depletion  Thoracic and Lumbar Region  No depletion  Buccal Region  No depletion  Temple Region  No depletion  Clavicle Bone Region  No depletion  Clavicle and Acromion Bone Region  No depletion  Scapular Bone Region  No depletion  Dorsal Hand  No depletion  Patellar Region  No depletion  Anterior Thigh Region  No depletion  Posterior Calf Region  No depletion  Edema (RD Assessment)  None     Diet Order:   Diet Order            Diet renal/carb modified with fluid restriction Diet-HS Snack? Nothing; Fluid restriction: 1200 mL Fluid; Room service  appropriate? Yes; Fluid consistency: Thin  Diet effective now             EDUCATION NEEDS:   No education needs have been identified at this time  Skin:  Skin Assessment: Reviewed RN Assessment  Last BM:  2/27  Height:   Ht Readings from Last 1 Encounters:  01/22/19 5\' 5"  (1.651 m)   Weight:   Wt Readings from Last 1 Encounters:  01/24/19 68.7 kg   BMI:  Body mass index is 25.2 kg/m.  Estimated Nutritional Needs:   Kcal:  1800-2000  Protein:  90-105 gm  Fluid:  1,000 ml + UOP  Arthur Holms, RD, LDN Pager #: [phone] After-Hours Pager #: [phone]

## 2019-01-25 NOTE — Progress Notes (Signed)
Patient ID: Alyssa Floyd, female   DOB: December 08, 1965, 53 y.o.   MRN: [id number] S: feels well and is off of oxygen O:BP (!) 165/92 (BP Location: Left Arm)   Pulse 80   Temp 98.1 F (36.7 C)   Resp 18   Ht 5\' 5"  (1.651 m)   Wt 68.7 kg   SpO2 96%   BMI 25.20 kg/m   Intake/Output Summary (Last 24 hours) at 01/25/2019 1102 Last data filed at 01/25/2019 0600 Gross per 24 hour  Intake 240 ml  Output 3002 ml  Net -2762 ml   Intake/Output: I/O last 3 completed shifts: In: 240 [P.O.:240] Out: 3002 [Other:3002]  Intake/Output this shift:  No intake/output data recorded. Weight change:  Gen: NAD CVS: no rub Resp: cta Abd: benign Ext: no edema  Recent Labs  Lab 01/22/19 0935 01/23/19 0242 01/24/19 0818  NA 142  141 134* 137  K 3.7  3.6 3.5 3.5  CL 111  110 100 104  CO2 24  24 26 25   GLUCOSE 130*  130* 324* 345*  BUN 25*  25* 11 22*  CREATININE 4.22*  4.15* 2.67* 4.25*  ALBUMIN 1.8*  1.8*  --  1.5*  CALCIUM 6.5*  6.5* 6.7* 7.0*  PHOS 4.6  4.6  --  4.3  AST 17  --   --   ALT 15  --   --    Liver Function Tests: Recent Labs  Lab 01/22/19 0935 01/24/19 0818  AST 17  --   ALT 15  --   ALKPHOS 89  --   BILITOT 0.6  --   PROT 5.1*  --   ALBUMIN 1.8*  1.8* 1.5*   No results for input(s): LIPASE, AMYLASE in the last 168 hours. No results for input(s): AMMONIA in the last 168 hours. CBC: Recent Labs  Lab 01/22/19 0935 01/23/19 0242 01/24/19 0819  WBC 6.1 5.2 5.5  HGB 9.3* 9.2* 9.9*  HCT 31.4* 30.8* 33.7*  MCV 88.0 87.0 88.5  PLT 177 130* 175   Cardiac Enzymes: No results for input(s): CKTOTAL, CKMB, CKMBINDEX, TROPONINI in the last 168 hours. CBG: Recent Labs  Lab 01/24/19 1205 01/24/19 1256 01/24/19 1624 01/24/19 2107 01/25/19 0719  GLUCAP 223* 240* 228* 281* 275*    Iron Studies: No results for input(s): IRON, TIBC, TRANSFERRIN, FERRITIN in the last 72 hours. Studies/Results: Dg Chest 1 View  Result Date: 01/23/2019 CLINICAL DATA:   Status post thoracentesis. EXAM: CHEST  1 VIEW COMPARISON:  01/23/2019 at 0643 hours FINDINGS: Right jugular dialysis catheter and left jugular Port-A-Cath or unchanged. The cardiac silhouette remains enlarged. There is a small right pleural effusion, much smaller than on the earlier study following interval thoracentesis. There is improved aeration of the right lung. A small left pleural effusion has not significantly changed. No pneumothorax is identified. IMPRESSION: Decreased size of right pleural effusion following thoracentesis. No pneumothorax identified. Electronically Signed   By: Logan Bores M.D.   On: 01/23/2019 12:27   Ir Thoracentesis Asp Pleural Space W/img Guide  Result Date: 01/23/2019 INDICATION: Shortness of breath. Renal insufficiency. Right-sided pleural effusion. Request for diagnostic and therapeutic thoracentesis. EXAM: ULTRASOUND GUIDED RIGHT THORACENTESIS MEDICATIONS: None. COMPLICATIONS: None immediate. PROCEDURE: An ultrasound guided thoracentesis was thoroughly discussed with the patient and questions answered. The benefits, risks, alternatives and complications were also discussed. The patient understands and wishes to proceed with the procedure. Written consent was obtained. Ultrasound was performed to localize and mark an adequate pocket of fluid in the  right chest. The area was then prepped and draped in the normal sterile fashion. 1% Lidocaine was used for local anesthesia. Under ultrasound guidance a 6 Fr Safe-T-Centesis catheter was introduced. Thoracentesis was performed. The catheter was removed and a dressing applied. FINDINGS: A total of approximately 1.7 L of clear yellow fluid was removed. Samples were sent to the laboratory as requested by the clinical team. IMPRESSION: Successful ultrasound guided right thoracentesis yielding 1.7 L of pleural fluid. Read by: Ascencion Dike PA-C Electronically Signed   By: Jacqulynn Cadet M.D.   On: 01/23/2019 11:41   . amLODipine   10 mg Oral Daily  . atorvastatin  20 mg Oral Daily  . calcitRIOL  0.5 mcg Oral Daily  . calcium acetate  667 mg Oral TID WC  . Chlorhexidine Gluconate Cloth  6 each Topical Q0600  . cholecalciferol  2,000 Units Oral Daily  . ferrous sulfate  325 mg Oral Daily  . heparin  5,000 Units Subcutaneous Q8H  . insulin aspart  0-5 Units Subcutaneous QHS  . insulin aspart  0-9 Units Subcutaneous TID WC  . insulin aspart  3 Units Subcutaneous TID WC  . insulin glargine  10 Units Subcutaneous Daily  . levETIRAcetam  1,000 mg Oral Daily  . tamoxifen  20 mg Oral Daily    BMET    Component Value Date/Time   NA 137 01/24/2019 0818   K 3.5 01/24/2019 0818   CL 104 01/24/2019 0818   CO2 25 01/24/2019 0818   GLUCOSE 345 (H) 01/24/2019 0818   BUN 22 (H) 01/24/2019 0818   CREATININE 4.25 (H) 01/24/2019 0818   CALCIUM 7.0 (L) 01/24/2019 0818   GFRNONAA 11 (L) 01/24/2019 0818   GFRAA 13 (L) 01/24/2019 0818   CBC    Component Value Date/Time   WBC 5.5 01/24/2019 0819   RBC 3.81 (L) 01/24/2019 0819   HGB 9.9 (L) 01/24/2019 0819   HCT 33.7 (L) 01/24/2019 0819   PLT 175 01/24/2019 0819   MCV 88.5 01/24/2019 0819   MCH 26.0 01/24/2019 0819   MCHC 29.4 (L) 01/24/2019 0819   RDW 15.6 (H) 01/24/2019 0819   LYMPHSABS 0.8 12/20/2018 0355   MONOABS 0.7 12/20/2018 0355   EOSABS 0.3 12/20/2018 0355   BASOSABS 0.0 12/20/2018 0355    Dialysis Orders: Center:[facility] TTS. [facility]94.7SJGG Bath 3K/2.5CaTime 4 hrsHeparin 2000. AccessRIJ tdcBFR 400DFR 800 Micera 215mcg IV every 2 weeks, calcitriol 0.49mcg po qTTS  Assessment/Plan: 1. Hypoxic respiratory failure- due to volume overload in setting of ESRD and weight loss.Markedly improved with HD and UF and is well below her outpatient edw of 73.5kg.  2. plueral effusions- s/p diagnostic thoracentesis by IR. Plan per primary. 3. ESRD- Continue with HD qTTS. New edw will be closer to 67kg. 1. Will plan for HD tomorrow if she remains an  inpatient.   4. Hypertension/volume- BP better with UF and challenging edw.  She is 6kg below previous edw.   5. Anemia- On micera as an outpatient. 6. Metabolic bone disease- Continue with meds 7. Nutrition- Renal diet and will need calorie count to make sure she is getting enough nutrition. 8. Pleural effusions- plan for CT scan for further workup per primary svc.  9. Elevated AST- unclear if she is drinking etoh at home 10. Disposition- voiced concerns over financial issues and would benefit from SW/CM consultation.  11. Vascular access- she only has TDC and should have been referred for permanent access. She will need to follow up with her Nephrologist  at [facility].  Donetta Potts, MD [employer] [phone]

## 2019-01-25 NOTE — Progress Notes (Signed)
Echocardiogram 2D Echocardiogram has been performed.  Alyssa Floyd 01/25/2019, 2:19 PM

## 2019-01-25 NOTE — Progress Notes (Signed)
PROGRESS NOTE    Alyssa Floyd  KDX:[id number] DOB: 1966-03-04 DOA: 01/21/2019 PCP: Kirke Shaggy, MD   Brief Narrative:  53 year old with history of ESRD on hemodialysis Tuesday Thursday Saturday, diabetes mellitus type 2, essential hypertension, hyperlipidemia came to the hospital initially at [facility] with complains of shortness of breath.  She was found to be fluid overloaded.  Chest x-ray was suggestive of increasing opacity in the right lung base.  She was also hypoxic saturating 80%.  She underwent thoracentesis on 2/26 and about 1.7 L of fluid was removed.  Fluid was sent for cytology analysis.  Nephrology consulted for hemodialysis.  Assessment & Plan:   Principal Problem:   Volume overload Active Problems:   [name], type 1 (HCC)   Hypertension   Pleural effusion   ESRD (end stage renal disease) on dialysis (HCC)   Acute respiratory failure (HCC)  1-acute hypoxic respiratory failure, secondary to right side pleural effusion: Patient will need more fluid removal and during her outpatient dialysis She underwent thoracentesis on 2/26 yielding 1.7 fluid We repeated CT chest to evaluate for underlying mass. Awaiting cytology. We will check echocardiogram  2-symptomatic hypoglycemia, improved history of diabetes insulin-dependent. Having A1c 9.2 On 10 units of Lantus, sliding scale insulin  3-end-stage renal disease on hemodialysis Tuesday Thursday and Saturday She had dialysis yesterday.  4-History of seizure disorder: Continue with Keppra  5-History of breast consult; On tamoxifen  6-Hypertension; continue with Norvasc   Estimated body mass index is 25.2 kg/m as calculated from the following:   Height as of this encounter: 5\' 5"  (1.651 m).   Weight as of this encounter: 68.7 kg.   DVT prophylaxis: heparin Code Status: full code Family Communication: care discussed with patient.  Disposition Plan: home in 24 hours. Awaiting ECHO results.    Consultants:    Nephrology  IR   Procedures:   Thoracentesis 2-26   Antimicrobials:   none   Subjective: Alert, breathing better   Objective: Vitals:   01/24/19 1623 01/24/19 2354 01/25/19 0337 01/25/19 0718  BP: (!) 151/86 (!) 148/70 (!) 153/94 (!) 165/92  Pulse: 83 78  80  Resp:  18    Temp: 98.3 F (36.8 C) 98.4 F (36.9 C) 98.1 F (36.7 C) 98.1 F (36.7 C)  TempSrc:  Oral Oral   SpO2: 95% 92% 94% 96%  Weight:      Height:        Intake/Output Summary (Last 24 hours) at 01/25/2019 1310 Last data filed at 01/25/2019 0600 Gross per 24 hour  Intake 240 ml  Output -  Net 240 ml   Filed Weights   01/22/19 1025 01/22/19 1425 01/24/19 0712  Weight: 74.9 kg 70.9 kg 68.7 kg    Examination:  General exam: Appears calm and comfortable  Respiratory system: Clear to auscultation. Respiratory effort normal. Cardiovascular system: S1 & S2 heard, RRR. No JVD, murmurs, rubs, gallops or clicks. No pedal edema. Gastrointestinal system: Abdomen is nondistended, soft and nontender. No organomegaly or masses felt. Normal bowel sounds heard. Central nervous system: Alert and oriented. No focal neurological deficits. Extremities: Symmetric 5 x 5 power. Skin: No rashes, lesions or ulcers    Data Reviewed: I have personally reviewed following labs and imaging studies  CBC: Recent Labs  Lab 01/22/19 0935 01/23/19 0242 01/24/19 0819  WBC 6.1 5.2 5.5  HGB 9.3* 9.2* 9.9*  HCT 31.4* 30.8* 33.7*  MCV 88.0 87.0 88.5  PLT 177 130* 976   Basic Metabolic Panel: Recent Labs  Lab 01/22/19 0935 01/23/19 0242 01/24/19 0818  NA 142  141 134* 137  K 3.7  3.6 3.5 3.5  CL 111  110 100 104  CO2 24  24 26 25   GLUCOSE 130*  130* 324* 345*  BUN 25*  25* 11 22*  CREATININE 4.22*  4.15* 2.67* 4.25*  CALCIUM 6.5*  6.5* 6.7* 7.0*  MG 1.9  --   --   PHOS 4.6  4.6  --  4.3   GFR: Estimated Creatinine Clearance: 15.1 mL/min (A) (by C-G formula based on SCr of 4.25 mg/dL (H)). Liver  Function Tests: Recent Labs  Lab 01/22/19 0935 01/24/19 0818  AST 17  --   ALT 15  --   ALKPHOS 89  --   BILITOT 0.6  --   PROT 5.1*  --   ALBUMIN 1.8*  1.8* 1.5*   No results for input(s): LIPASE, AMYLASE in the last 168 hours. No results for input(s): AMMONIA in the last 168 hours. Coagulation Profile: No results for input(s): INR, PROTIME in the last 168 hours. Cardiac Enzymes: No results for input(s): CKTOTAL, CKMB, CKMBINDEX, TROPONINI in the last 168 hours. BNP (last 3 results) No results for input(s): PROBNP in the last 8760 hours. HbA1C: No results for input(s): HGBA1C in the last 72 hours. CBG: Recent Labs  Lab 01/24/19 1256 01/24/19 1624 01/24/19 2107 01/25/19 0719 01/25/19 1200  GLUCAP 240* 228* 281* 275* 276*   Lipid Profile: No results for input(s): CHOL, HDL, LDLCALC, TRIG, CHOLHDL, LDLDIRECT in the last 72 hours. Thyroid Function Tests: No results for input(s): TSH, T4TOTAL, FREET4, T3FREE, THYROIDAB in the last 72 hours. Anemia Panel: No results for input(s): VITAMINB12, FOLATE, FERRITIN, TIBC, IRON, RETICCTPCT in the last 72 hours. Sepsis Labs: No results for input(s): PROCALCITON, LATICACIDVEN in the last 168 hours.  Recent Results (from the past 240 hour(s))  MRSA PCR Screening     Status: None   Collection Time: 01/22/19 12:32 AM  Result Value Ref Range Status   MRSA by PCR NEGATIVE NEGATIVE Final    Comment:        The GeneXpert MRSA Assay (FDA approved for NASAL specimens only), is one component of a comprehensive MRSA colonization surveillance program. It is not intended to diagnose MRSA infection nor to guide or monitor treatment for MRSA infections. Performed at [facility], [place] [address] [place], [place] [address]   Gram stain     Status: None   Collection Time: 01/23/19 11:24 AM  Result Value Ref Range Status   Specimen Description PLEURAL RIGHT  Final   Special Requests NONE  Final   Gram Stain   Final    MODERATE  WBC PRESENT, PREDOMINANTLY MONONUCLEAR NO ORGANISMS SEEN Performed at [facility], [place] [address] [place], [place] [address]    Report Status 01/23/2019 FINAL  Final  Culture, body fluid-bottle     Status: None (Preliminary result)   Collection Time: 01/23/19 11:24 AM  Result Value Ref Range Status   Specimen Description PLEURAL RIGHT  Final   Special Requests   Final    NONE Performed at [facility], [place] [address] [place], [place] [address]    Culture NO GROWTH 2 DAYS  Final   Report Status PENDING  Incomplete         Radiology Studies: Ct Chest Wo Contrast  Result Date: 01/25/2019 CLINICAL DATA:  Cough. EXAM: CT CHEST WITHOUT CONTRAST TECHNIQUE: Multidetector CT imaging of the chest was  performed following the standard protocol without IV contrast. COMPARISON:  Radiograph of January 23, 2019. CT scan of December 18, 2018. FINDINGS: Cardiovascular: No evidence of thoracic aortic aneurysm. Mild cardiomegaly is noted with minimal pericardial effusion. Right internal jugular dialysis catheter is noted with distal tip in right atrium. Left internal jugular Port-A-Cath is noted with distal tip in expected position of the SVC. Mediastinum/Nodes: Thyroid gland and esophagus are unremarkable. No definite adenopathy is noted, although it is difficult to evaluate due to lack of intravenous contrast. Lungs/Pleura: No pneumothorax is noted. Moderate right pleural effusion is noted. Minimal right basilar subsegmental atelectasis is noted. Mild left pleural effusion is noted with associated left basilar subsegmental atelectasis. Upper Abdomen: No acute abnormality. Musculoskeletal: No chest wall mass or suspicious bone lesions identified. IMPRESSION: Moderate right pleural effusion is noted with minimal right basilar subsegmental atelectasis. Mild left pleural effusion is noted with associated left basilar atelectasis. Minimal pericardial effusion is noted. Electronically Signed   By:  Marijo Conception, M.D.   On: 01/25/2019 12:40        Scheduled Meds: . amLODipine  10 mg Oral Daily  . atorvastatin  20 mg Oral Daily  . calcitRIOL  0.5 mcg Oral Daily  . calcium acetate  667 mg Oral TID WC  . Chlorhexidine Gluconate Cloth  6 each Topical Q0600  . cholecalciferol  2,000 Units Oral Daily  . ferrous sulfate  325 mg Oral Daily  . heparin  5,000 Units Subcutaneous Q8H  . insulin aspart  0-5 Units Subcutaneous QHS  . insulin aspart  0-9 Units Subcutaneous TID WC  . insulin aspart  3 Units Subcutaneous TID WC  . insulin glargine  10 Units Subcutaneous Daily  . levETIRAcetam  1,000 mg Oral Daily  . tamoxifen  20 mg Oral Daily   Continuous Infusions:   LOS: 3 days    Time spent: 35 minutes    Elmarie Shiley, MD [facility]  01/25/2019, 1:10 PM

## 2019-01-26 LAB — CBC
HCT: 33 % — ABNORMAL LOW (ref 36.0–46.0)
Hemoglobin: 9.6 g/dL — ABNORMAL LOW (ref 12.0–15.0)
MCH: 25.5 pg — ABNORMAL LOW (ref 26.0–34.0)
MCHC: 29.1 g/dL — ABNORMAL LOW (ref 30.0–36.0)
MCV: 87.8 fL (ref 80.0–100.0)
Platelets: 191 10*3/uL (ref 150–400)
RBC: 3.76 MIL/uL — ABNORMAL LOW (ref 3.87–5.11)
RDW: 15.1 % (ref 11.5–15.5)
WBC: 5.6 10*3/uL (ref 4.0–10.5)
nRBC: 0 % (ref 0.0–0.2)

## 2019-01-26 LAB — RENAL FUNCTION PANEL
Albumin: 1.6 g/dL — ABNORMAL LOW (ref 3.5–5.0)
Anion gap: 8 (ref 5–15)
BUN: 33 mg/dL — ABNORMAL HIGH (ref 6–20)
CO2: 26 mmol/L (ref 22–32)
Calcium: 7.4 mg/dL — ABNORMAL LOW (ref 8.9–10.3)
Chloride: 101 mmol/L (ref 98–111)
Creatinine, Ser: 4.13 mg/dL — ABNORMAL HIGH (ref 0.44–1.00)
GFR calc Af Amer: 14 mL/min — ABNORMAL LOW
GFR calc non Af Amer: 12 mL/min — ABNORMAL LOW
Glucose, Bld: 213 mg/dL — ABNORMAL HIGH (ref 70–99)
Phosphorus: 3.7 mg/dL (ref 2.5–4.6)
Potassium: 3.4 mmol/L — ABNORMAL LOW (ref 3.5–5.1)
Sodium: 135 mmol/L (ref 135–145)

## 2019-01-26 LAB — CHOLESTEROL, BODY FLUID: Cholesterol, Fluid: 46 mg/dL

## 2019-01-26 LAB — GLUCOSE, CAPILLARY: Glucose-Capillary: 162 mg/dL — ABNORMAL HIGH (ref 70–99)

## 2019-01-26 MED ORDER — HEPARIN SODIUM (PORCINE) 1000 UNIT/ML IJ SOLN
INTRAMUSCULAR | Status: AC
  Administered 2019-01-26: 3200 [IU]
  Filled 2019-01-26: qty 5

## 2019-01-26 MED ORDER — INSULIN GLARGINE 100 UNIT/ML ~~LOC~~ SOLN: 10.0000 [IU] | Freq: Every day | SUBCUTANEOUS | 11 refills | Status: DC

## 2019-01-26 MED ORDER — CALCITRIOL 0.5 MCG PO CAPS
ORAL_CAPSULE | ORAL | Status: AC
  Filled 2019-01-26: qty 1

## 2019-01-26 MED ORDER — HEPARIN SODIUM (PORCINE) 1000 UNIT/ML DIALYSIS
20.0000 [IU]/kg | INTRAMUSCULAR | Status: DC | PRN
  Administered 2019-01-26: 1400 [IU] via INTRAVENOUS_CENTRAL

## 2019-01-26 MED ORDER — HEPARIN SODIUM (PORCINE) 1000 UNIT/ML IJ SOLN
INTRAMUSCULAR | Status: AC
  Filled 2019-01-26: qty 2

## 2019-01-26 MED ORDER — FERROUS SULFATE 325 (65 FE) MG PO TBEC: 1.0000 | DELAYED_RELEASE_TABLET | Freq: Every day | ORAL | 0 refills | Status: AC

## 2019-01-26 NOTE — Procedures (Signed)
I was present at this dialysis session. I have reviewed the session itself and made appropriate changes.   Vital signs in last 24 hours:  Temp:  [98.4 F (36.9 C)-98.6 F (37 C)] 98.4 F (36.9 C) (02/28 2325) Pulse Rate:  [76-84] 76 (02/28 2325) Resp:  [14] 14 (02/28 2325) BP: (130-139)/(75-80) 139/80 (02/28 2325) SpO2:  [95 %-100 %] 100 % (02/28 2325) Weight:  [66.9 kg] 66.9 kg (02/28 2000) Weight change: -1.84 kg Filed Weights   01/22/19 1425 01/24/19 0712 01/25/19 2000  Weight: 70.9 kg 68.7 kg 66.9 kg    Recent Labs  Lab 01/26/19 0710  NA 135  K 3.4*  CL 101  CO2 26  GLUCOSE 213*  BUN 33*  CREATININE 4.13*  CALCIUM 7.4*  PHOS 3.7    Recent Labs  Lab 01/23/19 0242 01/24/19 0819 01/26/19 0710  WBC 5.2 5.5 5.6  HGB 9.2* 9.9* 9.6*  HCT 30.8* 33.7* 33.0*  MCV 87.0 88.5 87.8  PLT 130* 175 191    Scheduled Meds: . amLODipine  10 mg Oral Daily  . atorvastatin  20 mg Oral Daily  . calcitRIOL      . calcitRIOL  0.5 mcg Oral Daily  . calcium acetate  667 mg Oral TID WC  . Chlorhexidine Gluconate Cloth  6 each Topical Q0600  . cholecalciferol  2,000 Units Oral Daily  . feeding supplement  1 Container Oral BID BM  . ferrous sulfate  325 mg Oral Daily  . heparin      . heparin  5,000 Units Subcutaneous Q8H  . insulin aspart  0-5 Units Subcutaneous QHS  . insulin aspart  0-9 Units Subcutaneous TID WC  . insulin aspart  3 Units Subcutaneous TID WC  . insulin glargine  10 Units Subcutaneous Daily  . levETIRAcetam  1,000 mg Oral Daily  . tamoxifen  20 mg Oral Daily   Continuous Infusions: PRN Meds:.albuterol, benzonatate, heparin, hydrALAZINE, lidocaine, loperamide, traZODone    Dialysis Orders: Center:[facility] TTS. BPZ02.5ENID Bath 3K/2.5CaTime 4 hrsHeparin 2000. AccessRIJ tdcBFR 400DFR 800 Micera 217mcg IV every 2 weeks, calcitriol 0.59mcg po qTTS  Assessment/Plan: 1. Hypoxic respiratory failure- due to volume overload in setting of ESRD and weight  loss.Markedly improved with HD and UF and is well below her outpatient edw of 73.5kg.  2. plueral effusions- s/p diagnostic thoracentesis by IR. Plan per primary. 3. ESRD- Continue with HD qTTS. New edw will be closer to66kg. 1. Will weigh post HD for new edw. 4. Hypertension/volume- BP better with UF and challenging edw. She is 6kg below previous edw.  5. Anemia- On micera as an outpatient. 6. Metabolic bone disease- Continue with meds 7. Nutrition- Renal diet and will need calorie count to make sure she is getting enough nutrition. 8. Pleural effusions- plan for CT scan for further workup per primary svc.  9. Elevated AST- unclear if she is drinking etoh at home 10. Disposition- voiced concerns over financial issues and would benefit from SW/CM consultation.  11. Vascular access- she only has TDC and should have been referred for permanent access. She will need to follow up with her Nephrologist at [facility].   Donetta Potts,  MD 01/26/2019, 8:32 AM

## 2019-01-26 NOTE — Discharge Summary (Signed)
Physician Discharge Summary  Corinn Stoltzfus IFO:[id number] DOB: 1966/05/07 DOA: 01/21/2019  PCP: Kirke Shaggy, MD  Admit date: 01/21/2019 Discharge date: 01/26/2019  Admitted From: Home  Disposition:  Home   Recommendations for Outpatient Follow-up:  1. Follow up with PCP in 1-2 weeks 2. Please obtain BMP/CBC in one week 3. Needs referral to pulmonologist if pleural effusion persist after HD treatments. Needs follow chest x ray.     Discharge Condition: Stable.  CODE STATUS: full code Diet recommendation: Heart Healthy    Brief/Interim Summary: 53 year old with history of ESRD on hemodialysis Tuesday Thursday Saturday, diabetes mellitus type 2, essential hypertension, hyperlipidemia came to the hospital initially at [facility] with complains of shortness of breath. She was found to be fluid overloaded. Chest x-ray was suggestive of increasing opacity in the right lung base. She was also hypoxic saturating 80%. She underwent thoracentesis on 2/26 and about 1.7 L of fluid was removed. Fluid was sent for cytology analysis. Nephrology consulted for hemodialysis.  1-acute hypoxic respiratory failure, secondary to right side pleural effusion: Patient will need more fluid removal and during her outpatient dialysis She underwent thoracentesis on 2/26 yielding 1.7 fluid We repeated CT chest to evaluate for underlying mass. Negative for mass. Persist right side pleural effusion. Needs repeat chest x ray next week. Close follow up. Might need pulmonologist referral. Her dry weight was reduce.  ECHO normal EF, diastolic dysfunction.  Cytology negative for malignant cell.  Right side pleural effusion persist, she denies dyspnea, oxygen sat 99 RA. She will need further HD treatments out patient. Needs close follow up with chest x ray.   2-symptomatic hypoglycemia, improved history of diabetes insulin-dependent. Having A1c 9.2 On 10 units of Lantus, sliding scale insulin  3-end-stage  renal disease on hemodialysis Tuesday Thursday and Saturday She had dialysis today prior to discharge.   4-History of seizure disorder: Continue with Keppra  5-History of breast consult; On tamoxifen  6-Hypertension; continue with Norvasc   Discharge Diagnoses:  Principal Problem:   Volume overload Active Problems:   [name], type 1 (HCC)   Hypertension   Pleural effusion   ESRD (end stage renal disease) on dialysis [facility])   Acute respiratory failure [facility])    Discharge Instructions  Discharge Instructions    Diet - low sodium heart healthy   Complete by:  As directed    Increase activity slowly   Complete by:  As directed      Allergies as of 01/26/2019      Reactions   Penicillins Other (See Comments)   Pt reports hallucinations when taking penicillins. >Has patient had a PCN reaction causing immediate rash, facial/tongue/throat swelling, SOB or lightheadedness with hypotension: No Has patient had a PCN reaction causing severe rash involving mucus membranes or skin necrosis: No Has patient had a PCN reaction that required hospitalization No Has patient had a PCN reaction occurring within the last 10 years: No If all of the above answers are "NO", then may proceed with Cephalosporin use.      Medication List    STOP taking these medications   cefdinir 300 MG capsule Commonly known as:  OMNICEF   sodium bicarbonate 650 MG tablet     TAKE these medications   amLODipine 10 MG tablet Commonly known as:  NORVASC Take 1 tablet by mouth daily.   atorvastatin 20 MG tablet Commonly known as:  LIPITOR Take 1 tablet by mouth daily.   benzonatate 200 MG capsule Commonly known as:  TESSALON Take 1  capsule (200 mg total) by mouth 3 (three) times daily as needed for cough.   calcitRIOL 0.5 MCG capsule Commonly known as:  ROCALTROL Take 1 capsule by mouth daily.   calcium acetate 667 MG capsule Commonly known as:  PHOSLO Take 1 capsule (667 mg total) by mouth 3  (three) times daily with meals.   ferrous sulfate 325 (65 FE) MG EC tablet Take 1 tablet (325 mg total) by mouth daily.   insulin aspart 100 UNIT/ML injection Commonly known as:  novoLOG Inject 4 Units into the skin 3 (three) times daily with meals. Please take fingersticks reading more than 200   insulin glargine 100 UNIT/ML injection Commonly known as:  LANTUS Inject 0.1 mLs (10 Units total) into the skin daily. Start taking on:  January 27, 2019 What changed:  how much to take   levETIRAcetam 1000 MG tablet Commonly known as:  KEPPRA Take 1 tablet (1,000 mg total) by mouth daily.   levETIRAcetam 500 MG tablet Commonly known as:  KEPPRA Take 1 tablet (500mg ) by mouth after dialysis on Tuesday, Thursday, and Saturday   tamoxifen 20 MG tablet Commonly known as:  NOLVADEX Take 20 mg by mouth daily.       Allergies  Allergen Reactions  . Penicillins Other (See Comments)    Pt reports hallucinations when taking penicillins. >Has patient had a PCN reaction causing immediate rash, facial/tongue/throat swelling, SOB or lightheadedness with hypotension: No Has patient had a PCN reaction causing severe rash involving mucus membranes or skin necrosis: No Has patient had a PCN reaction that required hospitalization No Has patient had a PCN reaction occurring within the last 10 years: No If all of the above answers are "NO", then may proceed with Cephalosporin use.    Consultations: Nephrology  Procedures/Studies: Dg Chest 1 View  Result Date: 01/23/2019 CLINICAL DATA:  Status post thoracentesis. EXAM: CHEST  1 VIEW COMPARISON:  01/23/2019 at 0643 hours FINDINGS: Right jugular dialysis catheter and left jugular Port-A-Cath or unchanged. The cardiac silhouette remains enlarged. There is a small right pleural effusion, much smaller than on the earlier study following interval thoracentesis. There is improved aeration of the right lung. A small left pleural effusion has not significantly  changed. No pneumothorax is identified. IMPRESSION: Decreased size of right pleural effusion following thoracentesis. No pneumothorax identified. Electronically Signed   By: Logan Bores M.D.   On: 01/23/2019 12:27   Ct Chest Wo Contrast  Result Date: 01/25/2019 CLINICAL DATA:  Cough. EXAM: CT CHEST WITHOUT CONTRAST TECHNIQUE: Multidetector CT imaging of the chest was performed following the standard protocol without IV contrast. COMPARISON:  Radiograph of January 23, 2019. CT scan of December 18, 2018. FINDINGS: Cardiovascular: No evidence of thoracic aortic aneurysm. Mild cardiomegaly is noted with minimal pericardial effusion. Right internal jugular dialysis catheter is noted with distal tip in right atrium. Left internal jugular Port-A-Cath is noted with distal tip in expected position of the SVC. Mediastinum/Nodes: Thyroid gland and esophagus are unremarkable. No definite adenopathy is noted, although it is difficult to evaluate due to lack of intravenous contrast. Lungs/Pleura: No pneumothorax is noted. Moderate right pleural effusion is noted. Minimal right basilar subsegmental atelectasis is noted. Mild left pleural effusion is noted with associated left basilar subsegmental atelectasis. Upper Abdomen: No acute abnormality. Musculoskeletal: No chest wall mass or suspicious bone lesions identified. IMPRESSION: Moderate right pleural effusion is noted with minimal right basilar subsegmental atelectasis. Mild left pleural effusion is noted with associated left basilar atelectasis. Minimal pericardial  effusion is noted. Electronically Signed   By: Marijo Conception, M.D.   On: 01/25/2019 12:40   Dg Chest Port 1 View  Result Date: 01/23/2019 CLINICAL DATA:  Sob,pleural effusion,hx dialysis EXAM: PORTABLE CHEST 1 VIEW COMPARISON:  Chest radiograph 01/21/2019 FINDINGS: Stable enlarged cardiac silhouette. Port and central venous line unchanged. Large RIGHT effusion and basilar atelectasis unchanged. Small LEFT  effusion slightly increased. Upper lungs clear. IMPRESSION: 1. Stable support apparatus. 2. Stable large RIGHT effusion with atelectasis. 3. Increase in small LEFT effusion. Electronically Signed   By: Suzy Bouchard M.D.   On: 01/23/2019 09:55   Ir Thoracentesis Asp Pleural Space W/img Guide  Result Date: 01/23/2019 INDICATION: Shortness of breath. Renal insufficiency. Right-sided pleural effusion. Request for diagnostic and therapeutic thoracentesis. EXAM: ULTRASOUND GUIDED RIGHT THORACENTESIS MEDICATIONS: None. COMPLICATIONS: None immediate. PROCEDURE: An ultrasound guided thoracentesis was thoroughly discussed with the patient and questions answered. The benefits, risks, alternatives and complications were also discussed. The patient understands and wishes to proceed with the procedure. Written consent was obtained. Ultrasound was performed to localize and mark an adequate pocket of fluid in the right chest. The area was then prepped and draped in the normal sterile fashion. 1% Lidocaine was used for local anesthesia. Under ultrasound guidance a 6 Fr Safe-T-Centesis catheter was introduced. Thoracentesis was performed. The catheter was removed and a dressing applied. FINDINGS: A total of approximately 1.7 L of clear yellow fluid was removed. Samples were sent to the laboratory as requested by the clinical team. IMPRESSION: Successful ultrasound guided right thoracentesis yielding 1.7 L of pleural fluid. Read by: Ascencion Dike PA-C Electronically Signed   By: Jacqulynn Cadet M.D.   On: 01/23/2019 11:41      Subjective: Alert and oriented. She is breathing better. Oxygen sat 99 % RA  Discharge Exam: Vitals:   01/26/19 1105 01/26/19 1225  BP: (!) 130/93 (!) 160/90  Pulse: 73 69  Resp: 18   Temp: 97.8 F (36.6 C) 98.2 F (36.8 C)  SpO2: 97% 99%   Vitals:   01/26/19 1000 01/26/19 1030 01/26/19 1105 01/26/19 1225  BP: 120/79 122/80 (!) 130/93 (!) 160/90  Pulse: 72 72 73 69  Resp:   18    Temp:   97.8 F (36.6 C) 98.2 F (36.8 C)  TempSrc:   Oral Oral  SpO2:   97% 99%  Weight:   63.5 kg   Height:        General: Pt is alert, awake, not in acute distress Cardiovascular: RRR, S1/S2 +, no rubs, no gallops Respiratory: CTA bilaterally, no wheezing, no rhonchi Abdominal: Soft, NT, ND, bowel sounds + Extremities: no edema, no cyanosis    The results of significant diagnostics from this hospitalization (including imaging, microbiology, ancillary and laboratory) are listed below for reference.     Microbiology: Recent Results (from the past 240 hour(s))  MRSA PCR Screening     Status: None   Collection Time: 01/22/19 12:32 AM  Result Value Ref Range Status   MRSA by PCR NEGATIVE NEGATIVE Final    Comment:        The GeneXpert MRSA Assay (FDA approved for NASAL specimens only), is one component of a comprehensive MRSA colonization surveillance program. It is not intended to diagnose MRSA infection nor to guide or monitor treatment for MRSA infections. Performed at [facility], [place] [address], [place], [place] [address]   Gram stain     Status: None   Collection Time: 01/23/19 11:24 AM  Result Value Ref Range Status   Specimen Description PLEURAL RIGHT  Final   Special Requests NONE  Final   Gram Stain   Final    MODERATE WBC PRESENT, PREDOMINANTLY MONONUCLEAR NO ORGANISMS SEEN Performed at [facility], [address], [place], [place] [address]    Report Status 01/23/2019 FINAL  Final  Culture, body fluid-bottle     Status: None (Preliminary result)   Collection Time: 01/23/19 11:24 AM  Result Value Ref Range Status   Specimen Description PLEURAL RIGHT  Final   Special Requests   Final    NONE Performed at [facility], [place] [address], [place], [place] [address]    Culture NO GROWTH 2 DAYS  Final   Report Status PENDING  Incomplete     Labs: BNP (last 3 results) Recent Labs    08/14/18 1826  BNP 301.6*   Basic Metabolic  Panel: Recent Labs  Lab 01/22/19 0935 01/23/19 0242 01/24/19 0818 01/26/19 0710  NA 142  141 134* 137 135  K 3.7  3.6 3.5 3.5 3.4*  CL 111  110 100 104 101  CO2 24  24 26 25 26   GLUCOSE 130*  130* 324* 345* 213*  BUN 25*  25* 11 22* 33*  CREATININE 4.22*  4.15* 2.67* 4.25* 4.13*  CALCIUM 6.5*  6.5* 6.7* 7.0* 7.4*  MG 1.9  --   --   --   PHOS 4.6  4.6  --  4.3 3.7   Liver Function Tests: Recent Labs  Lab 01/22/19 0935 01/24/19 0818 01/26/19 0710  AST 17  --   --   ALT 15  --   --   ALKPHOS 89  --   --   BILITOT 0.6  --   --   PROT 5.1*  --   --   ALBUMIN 1.8*  1.8* 1.5* 1.6*   No results for input(s): LIPASE, AMYLASE in the last 168 hours. No results for input(s): AMMONIA in the last 168 hours. CBC: Recent Labs  Lab 01/22/19 0935 01/23/19 0242 01/24/19 0819 01/26/19 0710  WBC 6.1 5.2 5.5 5.6  HGB 9.3* 9.2* 9.9* 9.6*  HCT 31.4* 30.8* 33.7* 33.0*  MCV 88.0 87.0 88.5 87.8  PLT 177 130* 175 191   Cardiac Enzymes: No results for input(s): CKTOTAL, CKMB, CKMBINDEX, TROPONINI in the last 168 hours. BNP: Invalid input(s): POCBNP CBG: Recent Labs  Lab 01/24/19 2107 01/25/19 0719 01/25/19 1200 01/25/19 1650 01/26/19 0750  GLUCAP 281* 275* 276* 174* 162*   D-Dimer No results for input(s): DDIMER in the last 72 hours. Hgb A1c No results for input(s): HGBA1C in the last 72 hours. Lipid Profile No results for input(s): CHOL, HDL, LDLCALC, TRIG, CHOLHDL, LDLDIRECT in the last 72 hours. Thyroid function studies No results for input(s): TSH, T4TOTAL, T3FREE, THYROIDAB in the last 72 hours.  Invalid input(s): FREET3 Anemia work up No results for input(s): VITAMINB12, FOLATE, FERRITIN, TIBC, IRON, RETICCTPCT in the last 72 hours. Urinalysis    Component Value Date/Time   COLORURINE STRAW (A) 09/07/2018 1041   APPEARANCEUR CLEAR (A) 09/07/2018 1041   LABSPEC 1.010 09/07/2018 1041   PHURINE 6.0 09/07/2018 1041   GLUCOSEU 150 (A) 09/07/2018 1041    HGBUR SMALL (A) 09/07/2018 1041   BILIRUBINUR NEGATIVE 09/07/2018 1041   KETONESUR NEGATIVE 09/07/2018 1041   PROTEINUR 100 (A) 09/07/2018 1041   NITRITE NEGATIVE 09/07/2018 1041   LEUKOCYTESUR TRACE (A) 09/07/2018 1041   Sepsis Labs Invalid input(s): PROCALCITONIN,  WBC,  LACTICIDVEN Microbiology Recent Results (from the past 240 hour(s))  MRSA PCR Screening     Status: None   Collection Time: 01/22/19 12:32 AM  Result Value Ref Range Status   MRSA by PCR NEGATIVE NEGATIVE Final    Comment:        The GeneXpert MRSA Assay (FDA approved for NASAL specimens only), is one component of a comprehensive MRSA colonization surveillance program. It is not intended to diagnose MRSA infection nor to guide or monitor treatment for MRSA infections. Performed at [facility], [place] [address], [place], [place] [address]   Gram stain     Status: None   Collection Time: 01/23/19 11:24 AM  Result Value Ref Range Status   Specimen Description PLEURAL RIGHT  Final   Special Requests NONE  Final   Gram Stain   Final    MODERATE WBC PRESENT, PREDOMINANTLY MONONUCLEAR NO ORGANISMS SEEN Performed at [facility], [place] [address], [place], [place] [address]    Report Status 01/23/2019 FINAL  Final  Culture, body fluid-bottle     Status: None (Preliminary result)   Collection Time: 01/23/19 11:24 AM  Result Value Ref Range Status   Specimen Description PLEURAL RIGHT  Final   Special Requests   Final    NONE Performed at [facility], [place] [address], [place], [place] [address]    Culture NO GROWTH 2 DAYS  Final   Report Status PENDING  Incomplete     Time coordinating discharge: 35 minutes.   SIGNED:   Elmarie Shiley, MD  [facility] 01/26/2019, 12:53 PM

## 2019-01-26 NOTE — Plan of Care (Signed)
  Problem: Education: Goal: Knowledge of General Education information will improve Description: Including pain rating scale, medication(s)/side effects and non-pharmacologic comfort measures Outcome: Adequate for Discharge   Problem: Health Behavior/Discharge Planning: Goal: Ability to manage health-related needs will improve Outcome: Adequate for Discharge   Problem: Clinical Measurements: Goal: Ability to maintain clinical measurements within normal limits will improve Outcome: Adequate for Discharge Goal: Will remain free from infection Outcome: Adequate for Discharge Goal: Diagnostic test results will improve Outcome: Adequate for Discharge Goal: Respiratory complications will improve Outcome: Adequate for Discharge Goal: Cardiovascular complication will be avoided Outcome: Adequate for Discharge   Problem: Coping: Goal: Level of anxiety will decrease Outcome: Adequate for Discharge   Problem: Elimination: Goal: Will not experience complications related to bowel motility Outcome: Adequate for Discharge Goal: Will not experience complications related to urinary retention Outcome: Adequate for Discharge   Problem: Pain Managment: Goal: General experience of comfort will improve Outcome: Adequate for Discharge   Problem: Safety: Goal: Ability to remain free from injury will improve Outcome: Adequate for Discharge   Problem: Skin Integrity: Goal: Risk for impaired skin integrity will decrease Outcome: Adequate for Discharge   

## 2019-01-28 LAB — CULTURE, BODY FLUID W GRAM STAIN -BOTTLE: Culture: NO GROWTH

## 2019-01-31 LAB — GLUCOSE, CAPILLARY: GLUCOSE-CAPILLARY: 160 mg/dL — AB (ref 70–99)

## 2019-02-06 ENCOUNTER — Other Ambulatory Visit: Payer: Self-pay | Admitting: Internal Medicine

## 2019-02-20 DIAGNOSIS — R0600 Dyspnea, unspecified: Secondary | ICD-10-CM | POA: Insufficient documentation

## 2019-02-21 ENCOUNTER — Encounter: Payer: Self-pay | Admitting: Podiatry

## 2019-02-21 ENCOUNTER — Ambulatory Visit (INDEPENDENT_AMBULATORY_CARE_PROVIDER_SITE_OTHER): Payer: Medicaid Other | Admitting: Podiatry

## 2019-02-21 ENCOUNTER — Other Ambulatory Visit: Payer: Self-pay

## 2019-02-21 DIAGNOSIS — M79674 Pain in right toe(s): Secondary | ICD-10-CM | POA: Diagnosis not present

## 2019-02-21 DIAGNOSIS — E119 Type 2 diabetes mellitus without complications: Secondary | ICD-10-CM

## 2019-02-21 DIAGNOSIS — M79675 Pain in left toe(s): Secondary | ICD-10-CM

## 2019-02-21 DIAGNOSIS — B351 Tinea unguium: Secondary | ICD-10-CM

## 2019-02-21 NOTE — Progress Notes (Signed)
This patient presents to the office with chief complaint of long thick nails and diabetic feet.  This patient  says there  is  no pain and discomfort in her  feet.  This patient says there are long thick painful nails. Patient has not had her nails trimmed in over one year.  These nails are painful walking and wearing shoes.  Patient has no history of infection or drainage from both feet.  Patient is unable to  self treat his own nails . This patient presents  to the office today for treatment of the  long nails and a foot evaluation due to history of  Diabetes.  Patient is diabetic on dialysis.  General Appearance  Alert, conversant and in no acute stress.  Vascular  Dorsalis pedis and posterior tibial  pulses are palpable  bilaterally.  Capillary return is within normal limits  bilaterally. Temperature is within normal limits  bilaterally.  Neurologic  Senn-Weinstein monofilament wire test within normal limits  bilaterally. Muscle power within normal limits bilaterally.  Nails Thick disfigured discolored nails with subungual debris  from hallux to fifth toes bilaterally. No evidence of bacterial infection or drainage bilaterally.  Orthopedic  No limitations of motion of motion feet .  No crepitus or effusions noted.  No bony pathology or digital deformities noted.  Skin  normotropic skin with no porokeratosis noted bilaterally.  No signs of infections or ulcers noted.     Onychomycosis  Diabetes with no foot complications  IE  Debride nails x 10.  A diabetic foot exam was performed and there is no evidence of any vascular or neurologic pathology.   RTC 10 weeks    Gardiner Barefoot DPM

## 2019-03-02 ENCOUNTER — Inpatient Hospital Stay
Admission: EM | Admit: 2019-03-02 | Discharge: 2019-03-03 | DRG: 637 | Disposition: A | Discharge status: Home / Self Care | Attending: Internal Medicine | Admitting: Internal Medicine

## 2019-03-02 ENCOUNTER — Emergency Department

## 2019-03-02 ENCOUNTER — Other Ambulatory Visit: Payer: Self-pay

## 2019-03-02 ENCOUNTER — Encounter: Payer: Self-pay | Admitting: Emergency Medicine

## 2019-03-02 DIAGNOSIS — Z79899 Other long term (current) drug therapy: Secondary | ICD-10-CM

## 2019-03-02 DIAGNOSIS — N2581 Secondary hyperparathyroidism of renal origin: Secondary | ICD-10-CM | POA: Diagnosis not present

## 2019-03-02 DIAGNOSIS — C50919 Malignant neoplasm of unspecified site of unspecified female breast: Secondary | ICD-10-CM | POA: Diagnosis not present

## 2019-03-02 DIAGNOSIS — Z79810 Long term (current) use of selective estrogen receptor modulators (SERMs): Secondary | ICD-10-CM | POA: Diagnosis not present

## 2019-03-02 DIAGNOSIS — E039 Hypothyroidism, unspecified: Secondary | ICD-10-CM | POA: Diagnosis not present

## 2019-03-02 DIAGNOSIS — E1311 Other specified diabetes mellitus with ketoacidosis with coma: Secondary | ICD-10-CM

## 2019-03-02 DIAGNOSIS — E1022 Type 1 diabetes mellitus with diabetic chronic kidney disease: Secondary | ICD-10-CM | POA: Diagnosis present

## 2019-03-02 DIAGNOSIS — E1051 Type 1 diabetes mellitus with diabetic peripheral angiopathy without gangrene: Secondary | ICD-10-CM | POA: Diagnosis present

## 2019-03-02 DIAGNOSIS — R7989 Other specified abnormal findings of blood chemistry: Secondary | ICD-10-CM

## 2019-03-02 DIAGNOSIS — Z87441 Personal history of nephrotic syndrome: Secondary | ICD-10-CM | POA: Diagnosis not present

## 2019-03-02 DIAGNOSIS — Z880 Allergy status to penicillin: Secondary | ICD-10-CM

## 2019-03-02 DIAGNOSIS — Z90711 Acquired absence of uterus with remaining cervical stump: Secondary | ICD-10-CM | POA: Diagnosis not present

## 2019-03-02 DIAGNOSIS — Z794 Long term (current) use of insulin: Secondary | ICD-10-CM | POA: Diagnosis not present

## 2019-03-02 DIAGNOSIS — E876 Hypokalemia: Secondary | ICD-10-CM | POA: Diagnosis not present

## 2019-03-02 DIAGNOSIS — E559 Vitamin D deficiency, unspecified: Secondary | ICD-10-CM | POA: Diagnosis present

## 2019-03-02 DIAGNOSIS — J90 Pleural effusion, not elsewhere classified: Secondary | ICD-10-CM

## 2019-03-02 DIAGNOSIS — E785 Hyperlipidemia, unspecified: Secondary | ICD-10-CM | POA: Diagnosis not present

## 2019-03-02 DIAGNOSIS — I120 Hypertensive chronic kidney disease with stage 5 chronic kidney disease or end stage renal disease: Secondary | ICD-10-CM | POA: Diagnosis present

## 2019-03-02 DIAGNOSIS — K529 Noninfective gastroenteritis and colitis, unspecified: Secondary | ICD-10-CM | POA: Diagnosis not present

## 2019-03-02 DIAGNOSIS — G40909 Epilepsy, unspecified, not intractable, without status epilepticus: Secondary | ICD-10-CM | POA: Diagnosis present

## 2019-03-02 DIAGNOSIS — C779 Secondary and unspecified malignant neoplasm of lymph node, unspecified: Secondary | ICD-10-CM | POA: Diagnosis not present

## 2019-03-02 DIAGNOSIS — C50911 Malignant neoplasm of unspecified site of right female breast: Secondary | ICD-10-CM | POA: Diagnosis present

## 2019-03-02 DIAGNOSIS — N186 End stage renal disease: Secondary | ICD-10-CM | POA: Diagnosis not present

## 2019-03-02 DIAGNOSIS — E1110 Type 2 diabetes mellitus with ketoacidosis without coma: Secondary | ICD-10-CM | POA: Diagnosis present

## 2019-03-02 DIAGNOSIS — R569 Unspecified convulsions: Secondary | ICD-10-CM

## 2019-03-02 DIAGNOSIS — Z992 Dependence on renal dialysis: Secondary | ICD-10-CM

## 2019-03-02 DIAGNOSIS — J9601 Acute respiratory failure with hypoxia: Secondary | ICD-10-CM | POA: Diagnosis not present

## 2019-03-02 DIAGNOSIS — C773 Secondary and unspecified malignant neoplasm of axilla and upper limb lymph nodes: Secondary | ICD-10-CM

## 2019-03-02 DIAGNOSIS — Z833 Family history of diabetes mellitus: Secondary | ICD-10-CM | POA: Diagnosis not present

## 2019-03-02 DIAGNOSIS — F17210 Nicotine dependence, cigarettes, uncomplicated: Secondary | ICD-10-CM | POA: Diagnosis present

## 2019-03-02 DIAGNOSIS — D631 Anemia in chronic kidney disease: Secondary | ICD-10-CM | POA: Diagnosis not present

## 2019-03-02 DIAGNOSIS — E1010 Type 1 diabetes mellitus with ketoacidosis without coma: Secondary | ICD-10-CM | POA: Diagnosis present

## 2019-03-02 DIAGNOSIS — E10319 Type 1 diabetes mellitus with unspecified diabetic retinopathy without macular edema: Secondary | ICD-10-CM | POA: Diagnosis present

## 2019-03-02 LAB — BETA-HYDROXYBUTYRIC ACID: Beta-Hydroxybutyric Acid: 3.6 mmol/L — ABNORMAL HIGH (ref 0.05–0.27)

## 2019-03-02 LAB — BASIC METABOLIC PANEL
Anion gap: 12 (ref 5–15)
Anion gap: 13 (ref 5–15)
Anion gap: 8 (ref 5–15)
Anion gap: 7 (ref 5–15)
BUN: 54 mg/dL — ABNORMAL HIGH (ref 6–20)
BUN: 53 mg/dL — ABNORMAL HIGH (ref 6–20)
BUN: 23 mg/dL — ABNORMAL HIGH (ref 6–20)
BUN: 23 mg/dL — ABNORMAL HIGH (ref 6–20)
CO2: 17 mmol/L — ABNORMAL LOW (ref 22–32)
CO2: 18 mmol/L — ABNORMAL LOW (ref 22–32)
CO2: 27 mmol/L (ref 22–32)
CO2: 26 mmol/L (ref 22–32)
Calcium: 6.5 mg/dL — ABNORMAL LOW (ref 8.9–10.3)
Calcium: 6.8 mg/dL — ABNORMAL LOW (ref 8.9–10.3)
Calcium: 7 mg/dL — ABNORMAL LOW (ref 8.9–10.3)
Calcium: 6.7 mg/dL — ABNORMAL LOW (ref 8.9–10.3)
Chloride: 99 mmol/L (ref 98–111)
Chloride: 100 mmol/L (ref 98–111)
Chloride: 103 mmol/L (ref 98–111)
Chloride: 103 mmol/L (ref 98–111)
Creatinine, Ser: 6.19 mg/dL — ABNORMAL HIGH (ref 0.44–1.00)
Creatinine, Ser: 6.21 mg/dL — ABNORMAL HIGH (ref 0.44–1.00)
Creatinine, Ser: 2.68 mg/dL — ABNORMAL HIGH (ref 0.44–1.00)
Creatinine, Ser: 2.97 mg/dL — ABNORMAL HIGH (ref 0.44–1.00)
GFR calc Af Amer: 8 mL/min — ABNORMAL LOW (ref >60)
GFR calc Af Amer: 8 mL/min — ABNORMAL LOW (ref >60)
GFR calc Af Amer: 23 mL/min — ABNORMAL LOW (ref >60)
GFR calc Af Amer: 20 mL/min — ABNORMAL LOW (ref >60)
GFR calc non Af Amer: 7 mL/min — ABNORMAL LOW (ref >60)
GFR calc non Af Amer: 7 mL/min — ABNORMAL LOW (ref >60)
GFR calc non Af Amer: 20 mL/min — ABNORMAL LOW (ref >60)
GFR calc non Af Amer: 17 mL/min — ABNORMAL LOW (ref >60)
Glucose, Bld: 904 mg/dL (ref 70–99)
Glucose, Bld: 494 mg/dL — ABNORMAL HIGH (ref 70–99)
Glucose, Bld: 86 mg/dL (ref 70–99)
Glucose, Bld: 85 mg/dL (ref 70–99)
Potassium: 3.2 mmol/L — ABNORMAL LOW (ref 3.5–5.1)
Potassium: 2.9 mmol/L — ABNORMAL LOW (ref 3.5–5.1)
Potassium: 3.5 mmol/L (ref 3.5–5.1)
Potassium: 3.4 mmol/L — ABNORMAL LOW (ref 3.5–5.1)
Sodium: 128 mmol/L — ABNORMAL LOW (ref 135–145)
Sodium: 131 mmol/L — ABNORMAL LOW (ref 135–145)
Sodium: 138 mmol/L (ref 135–145)
Sodium: 136 mmol/L (ref 135–145)

## 2019-03-02 LAB — GLUCOSE, CAPILLARY
Glucose-Capillary: 111 mg/dL — ABNORMAL HIGH (ref 70–99)
Glucose-Capillary: 134 mg/dL — ABNORMAL HIGH (ref 70–99)
Glucose-Capillary: 237 mg/dL — ABNORMAL HIGH (ref 70–99)
Glucose-Capillary: 277 mg/dL — ABNORMAL HIGH (ref 70–99)
Glucose-Capillary: 434 mg/dL — ABNORMAL HIGH (ref 70–99)
Glucose-Capillary: 66 mg/dL — ABNORMAL LOW (ref 70–99)
Glucose-Capillary: 81 mg/dL (ref 70–99)
Glucose-Capillary: 97 mg/dL (ref 70–99)
Glucose-Capillary: >600 mg/dL (ref 70–99)
Glucose-Capillary: >600 mg/dL (ref 70–99)
Glucose-Capillary: >600 mg/dL (ref 70–99)
Glucose-Capillary: >600 mg/dL (ref 70–99)
Glucose-Capillary: >600 mg/dL (ref 70–99)
Glucose-Capillary: >600 mg/dL (ref 70–99)
Glucose-Capillary: >600 mg/dL (ref 70–99)
Glucose-Capillary: >600 mg/dL (ref 70–99)
Glucose-Capillary: >600 mg/dL (ref 70–99)
Glucose-Capillary: >600 mg/dL (ref 70–99)
Glucose-Capillary: >600 mg/dL (ref 70–99)

## 2019-03-02 LAB — MRSA PCR SCREENING: MRSA by PCR: NEGATIVE

## 2019-03-02 LAB — CBC WITH DIFFERENTIAL/PLATELET
Abs Immature Granulocytes: 0.02 10*3/uL (ref 0.00–0.07)
Basophils Absolute: 0.1 10*3/uL (ref 0.0–0.1)
Basophils Relative: 1 %
Eosinophils Absolute: 0.1 10*3/uL (ref 0.0–0.5)
Eosinophils Relative: 1 %
HCT: 43.3 % (ref 36.0–46.0)
Hemoglobin: 10.8 g/dL — ABNORMAL LOW (ref 12.0–15.0)
Immature Granulocytes: 0 %
Lymphocytes Relative: 10 %
Lymphs Abs: 0.7 10*3/uL (ref 0.7–4.0)
MCH: 26 pg (ref 26.0–34.0)
MCHC: 24.9 g/dL — ABNORMAL LOW (ref 30.0–36.0)
MCV: 104.1 fL — ABNORMAL HIGH (ref 80.0–100.0)
Monocytes Absolute: 0.5 10*3/uL (ref 0.1–1.0)
Monocytes Relative: 7 %
Neutro Abs: 5.7 10*3/uL (ref 1.7–7.7)
Neutrophils Relative %: 81 %
Platelets: 161 10*3/uL (ref 150–400)
RBC: 4.16 MIL/uL (ref 3.87–5.11)
RDW: 17.1 % — ABNORMAL HIGH (ref 11.5–15.5)
WBC: 7.1 10*3/uL (ref 4.0–10.5)
nRBC: 0 % (ref 0.0–0.2)

## 2019-03-02 LAB — CBC
HCT: 32.6 % — ABNORMAL LOW (ref 36.0–46.0)
Hemoglobin: 9.8 g/dL — ABNORMAL LOW (ref 12.0–15.0)
MCH: 26.2 pg (ref 26.0–34.0)
MCHC: 30.1 g/dL (ref 30.0–36.0)
MCV: 87.2 fL (ref 80.0–100.0)
Platelets: 159 10*3/uL (ref 150–400)
RBC: 3.74 MIL/uL — ABNORMAL LOW (ref 3.87–5.11)
RDW: 16.1 % — ABNORMAL HIGH (ref 11.5–15.5)
WBC: 7.2 10*3/uL (ref 4.0–10.5)
nRBC: 0 % (ref 0.0–0.2)

## 2019-03-02 LAB — PROTIME-INR
INR: 1 (ref 0.8–1.2)
Prothrombin Time: 13.5 seconds (ref 11.4–15.2)

## 2019-03-02 LAB — COMPREHENSIVE METABOLIC PANEL
ALT: 19 U/L (ref 0–44)
AST: 26 U/L (ref 15–41)
Albumin: 2.5 g/dL — ABNORMAL LOW (ref 3.5–5.0)
Alkaline Phosphatase: 131 U/L — ABNORMAL HIGH (ref 38–126)
Anion gap: 23 — ABNORMAL HIGH (ref 5–15)
BUN: 52 mg/dL — ABNORMAL HIGH (ref 6–20)
CO2: 12 mmol/L — ABNORMAL LOW (ref 22–32)
Calcium: 6.8 mg/dL — ABNORMAL LOW (ref 8.9–10.3)
Chloride: 90 mmol/L — ABNORMAL LOW (ref 98–111)
Creatinine, Ser: 6 mg/dL — ABNORMAL HIGH (ref 0.44–1.00)
GFR calc Af Amer: 9 mL/min — ABNORMAL LOW (ref >60)
GFR calc non Af Amer: 7 mL/min — ABNORMAL LOW (ref >60)
Glucose, Bld: 1554 mg/dL (ref 70–99)
Potassium: 4.9 mmol/L (ref 3.5–5.1)
Sodium: 125 mmol/L — ABNORMAL LOW (ref 135–145)
Total Bilirubin: 0.6 mg/dL (ref 0.3–1.2)
Total Protein: 6.2 g/dL — ABNORMAL LOW (ref 6.5–8.1)

## 2019-03-02 LAB — LACTIC ACID, PLASMA
Lactic Acid, Venous: 5.7 mmol/L (ref 0.5–1.9)
Lactic Acid, Venous: 1.9 mmol/L (ref 0.5–1.9)

## 2019-03-02 LAB — LIPASE, BLOOD: Lipase: 25 U/L (ref 11–51)

## 2019-03-02 LAB — ETHANOL: Alcohol, Ethyl (B): <10 mg/dL (ref <10)

## 2019-03-02 LAB — MAGNESIUM: Magnesium: 2.3 mg/dL (ref 1.7–2.4)

## 2019-03-02 LAB — TROPONIN I: Troponin I: <0.03 ng/mL (ref <0.03)

## 2019-03-02 LAB — PHOSPHORUS: Phosphorus: 2.5 mg/dL (ref 2.5–4.6)

## 2019-03-02 LAB — ACETAMINOPHEN LEVEL: Acetaminophen (Tylenol), Serum: <10 ug/mL — ABNORMAL LOW (ref 10–30)

## 2019-03-02 LAB — SALICYLATE LEVEL: Salicylate Lvl: <7 mg/dL (ref 2.8–30.0)

## 2019-03-02 MED ORDER — INSULIN REGULAR(HUMAN) IN NACL 100-0.9 UT/100ML-% IV SOLN
INTRAVENOUS | Status: DC
  Administered 2019-03-02: 4.3 [IU]/h via INTRAVENOUS
  Filled 2019-03-02: qty 100

## 2019-03-02 MED ORDER — INSULIN ASPART 100 UNIT/ML ~~LOC~~ SOLN
0.0000 [IU] | Freq: Three times a day (TID) | SUBCUTANEOUS | Status: DC
  Administered 2019-03-03: 9 [IU] via SUBCUTANEOUS
  Administered 2019-03-03: 3 [IU] via SUBCUTANEOUS
  Filled 2019-03-02 (×2): qty 1

## 2019-03-02 MED ORDER — SODIUM CHLORIDE 0.9 % IV BOLUS
2000.0000 mL | Freq: Once | INTRAVENOUS | Status: AC
  Administered 2019-03-02: 01:00:00 2000 mL via INTRAVENOUS

## 2019-03-02 MED ORDER — INSULIN ASPART 100 UNIT/ML ~~LOC~~ SOLN: 0.0000 [IU] | Freq: Every day | SUBCUTANEOUS | Status: DC

## 2019-03-02 MED ORDER — LOPERAMIDE HCL 2 MG PO CAPS
2.0000 mg | ORAL_CAPSULE | ORAL | Status: DC | PRN
  Administered 2019-03-02: 21:00:00 2 mg via ORAL
  Filled 2019-03-02: qty 1

## 2019-03-02 MED ORDER — LISINOPRIL 20 MG PO TABS
20.0000 mg | ORAL_TABLET | Freq: Every day | ORAL | Status: DC
  Administered 2019-03-02 – 2019-03-03 (×2): 20 mg via ORAL
  Filled 2019-03-02 (×2): qty 1

## 2019-03-02 MED ORDER — SODIUM CHLORIDE 0.9 % IV SOLN: INTRAVENOUS | Status: DC

## 2019-03-02 MED ORDER — LEVETIRACETAM IN NACL 1000 MG/100ML IV SOLN
1000.0000 mg | INTRAVENOUS | Status: AC
  Administered 2019-03-02: 1000 mg via INTRAVENOUS
  Filled 2019-03-02: qty 100

## 2019-03-02 MED ORDER — POTASSIUM CHLORIDE 10 MEQ/100ML IV SOLN: 10.0000 meq | INTRAVENOUS | Status: DC

## 2019-03-02 MED ORDER — INSULIN GLARGINE 100 UNIT/ML ~~LOC~~ SOLN
6.0000 [IU] | SUBCUTANEOUS | Status: DC
  Administered 2019-03-02: 22:00:00 6 [IU] via SUBCUTANEOUS
  Filled 2019-03-02: qty 0.06

## 2019-03-02 MED ORDER — DEXTROSE-NACL 5-0.45 % IV SOLN
INTRAVENOUS | Status: DC
  Administered 2019-03-02: 11:00:00 via INTRAVENOUS

## 2019-03-02 MED ORDER — HEPARIN SODIUM (PORCINE) 5000 UNIT/ML IJ SOLN
5000.0000 [IU] | Freq: Three times a day (TID) | INTRAMUSCULAR | Status: DC
  Administered 2019-03-02 – 2019-03-03 (×3): 5000 [IU] via SUBCUTANEOUS
  Filled 2019-03-02 (×3): qty 1

## 2019-03-02 MED ORDER — POTASSIUM CHLORIDE 10 MEQ/100ML IV SOLN
10.0000 meq | INTRAVENOUS | Status: DC
  Filled 2019-03-02 (×2): qty 100

## 2019-03-02 MED ORDER — INSULIN REGULAR(HUMAN) IN NACL 100-0.9 UT/100ML-% IV SOLN
INTRAVENOUS | Status: DC
  Administered 2019-03-02: 03:00:00 5.4 [IU]/h via INTRAVENOUS
  Filled 2019-03-02 (×2): qty 100

## 2019-03-02 MED ORDER — CHLORHEXIDINE GLUCONATE CLOTH 2 % EX PADS
6.0000 | MEDICATED_PAD | Freq: Every day | CUTANEOUS | Status: DC
  Administered 2019-03-03: 05:00:00 6 via TOPICAL

## 2019-03-02 NOTE — ED Provider Notes (Signed)
[facility] Emergency Department Provider Note  ____________________________________________   First MD Initiated Contact with Patient 03/02/19 0031     (approximate)  I have reviewed the triage vital signs and the nursing notes.   HISTORY  Chief Complaint Seizures  Level 5 caveat:  history/ROS limited by acute/critical illness  HPI Alyssa Floyd is a 53 y.o. female with medical history as listed below with extensive chronic medical history that includes what is apparently poorly controlled diabetes with prior episodes of [name], end-stage renal disease on hemodialysis Tuesdays, Thursdays, and Saturdays, and a history of breast cancer with metastases to the lymph nodes for which she is apparently undergoing chemotherapy.  She apparently also has a history of seizure disorder for which she takes Keppra according to the medical record.  She presents by EMS for evaluation of seizures.  History is limited and provided by the paramedic, but apparently she had 2 seizures at home with a very brief episode of postictal state in between the episodes.  She was postictal and somnolent but able to answer questions with the paramedics in route to the hospital but just before arrival at [facility] she started to have another generalized seizure and she was seizing when she was brought to her exam room.  The seizure had resolved by the time we were able to get IV access established and brought in the Ativan.  See hospital course for additional details.         Past Medical History:  Diagnosis Date  . Breast cancer ([place])   . 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 Diabetes mellitus ( [place])   . Diabetic nephropathy ([place])   . ESRD on dialysis ([place])   . Hyperlipidemia   . Hypertension   . Nephrotic syndrome    diabetic nephropathy, biopsy on 01/06/16 at [facility]  . Normocytic anemia   . Seizures ([place])   . Subclinical hypothyroidism   . Tobacco use   . Vitamin D  deficiency     Patient Active Problem List   Diagnosis Date Noted  . Dyspnea 02/20/2019  . Volume overload 01/22/2019  . Pleural effusion 01/22/2019  . ESRD (end stage renal disease) on dialysis ([place]) 01/22/2019  . Acute respiratory failure ([place]) 01/22/2019  . Hyperglycemia 12/18/2018  . Hypokalemia 10/11/2018  . Hypocalcemia 07/26/2018  . [name] (diabetic ketoacidoses) ([place]) 07/08/2018  . Hyperphosphatemia 06/29/2018  . Decreased right shoulder range of motion 04/04/2018  . Acute osteomyelitis of toe ([place]) 01/02/2018  . Onychogryphosis 12/25/2017  . Onychomycosis 12/25/2017  . Ulcer of toe of left foot ([place]) 12/25/2017  . CKD (chronic kidney disease) stage 5, GFR less than 15 ml/min (HCC) 12/20/2017  . Infected wound 12/19/2017  . Hypoalbuminemia 10/18/2017  . Syncope 09/26/2016  . Myositis of left thigh 08/25/2016  . Breast cancer metastasized to axillary lymph node, right ([place]) 07/15/2016  . Diabetic retinopathy associated with type 1 diabetes mellitus ([place]) 07/06/2016  . Malignant neoplasm of upper-outer quadrant of right female breast ([place]) 05/24/2016  . Dehydration   . Dysarthria   . HHNC (hyperglycemic hyperosmolar nonketotic coma) ([place])   . Acute encephalopathy   . Acute renal failure (ARF) ([place]) 01/29/2016  . [name], type 1 ([place]) 01/29/2016  . Prolonged Q-T interval on ECG 01/29/2016  . Hyperlipidemia 01/29/2016  . Hypertension 01/29/2016  . Chronic diarrhea 01/29/2016  . Nephrotic syndrome 01/29/2016  . Tobacco use 01/29/2016  . 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 Normocytic anemia 01/29/2016  .  Subclinical hypothyroidism 01/29/2016  . Encephalopathy acute   . Vitamin D deficiency 01/26/2016  . Moderate protein-calorie malnutrition ([place]) 01/08/2016  . Proteinuria 12/29/2015  . Type 1 diabetes mellitus with nephropathy ([place]) 12/28/2015    Past Surgical History:  Procedure Laterality Date  . BREAST SURGERY    . IR THORACENTESIS ASP PLEURAL SPACE W/IMG GUIDE   01/23/2019  . PARTIAL HYSTERECTOMY      Prior to Admission medications   Medication Sig Start Date End Date Taking? Authorizing Provider  calcium acetate (PHOSLO) 667 MG capsule Take 1 capsule (667 mg total) by mouth 3 (three) times daily with meals. 09/07/18  Yes Earleen Newport, MD  atorvastatin (LIPITOR) 20 MG tablet Take 1 tablet by mouth daily. 08/31/18   [provider]  benzonatate (TESSALON) 200 MG capsule Take 1 capsule (200 mg total) by mouth 3 (three) times daily as needed for cough. 12/20/18   Mayo, Pete Pelt, MD  calcitRIOL (ROCALTROL) 0.5 MCG capsule Take 1 capsule by mouth daily. 10/05/18   [provider]  ferrous sulfate 325 (65 FE) MG EC tablet Take 1 tablet (325 mg total) by mouth daily. 01/26/19 01/26/20  Regalado, Belkys A, MD  insulin aspart (NOVOLOG) 100 UNIT/ML injection Inject 4 Units into the skin 3 (three) times daily with meals. Please take fingersticks reading more than 200 07/11/18   Elgergawy, Silver Huguenin, MD  insulin glargine (LANTUS) 100 UNIT/ML injection Inject 0.1 mLs (10 Units total) into the skin daily. 01/27/19   Regalado, Belkys A, MD  levETIRAcetam (KEPPRA) 1000 MG tablet Take 1 tablet (1,000 mg total) by mouth daily. 12/20/18   Mayo, Pete Pelt, MD  levETIRAcetam (KEPPRA) 500 MG tablet Take 1 tablet (500mg ) by mouth after dialysis on Tuesday, Thursday, and Saturday 12/20/18   Mayo, Pete Pelt, MD  lisinopril (PRINIVIL,ZESTRIL) 20 MG tablet Take 20 mg by mouth daily.    [provider]  tamoxifen (NOLVADEX) 20 MG tablet Take 20 mg by mouth daily.    [provider]    Allergies Penicillins  Family History  Problem Relation Age of Onset  . Diabetes Other     Social History Social History   Tobacco Use  . Smoking status: Current Every Day Smoker    Packs/day: 0.50    Types: Cigarettes  . Smokeless tobacco: Never Used  Substance Use Topics  . Alcohol use: No  . Drug use: No    Review of Systems Level 5 caveat:   history/ROS limited by acute/critical illness  ____________________________________________   PHYSICAL EXAM:  VITAL SIGNS: ED Triage Vitals [03/02/19 0031]  Enc Vitals Group     BP (!) 185/86     Pulse Rate 97     Resp      Temp 97.6 F (36.4 C)     Temp Source Axillary     SpO2 100 %     Weight      Height      Head Circumference      Peak Flow      Pain Score      Pain Loc      Pain Edu?      Excl. in [place]?     Constitutional: Somnolent and postictal. Eyes: Conjunctivae are normal.  Pupils are small but equal and reactive bilaterally. Head: Atraumatic. Nose: No congestion/rhinnorhea. Mouth/Throat: Mucous membranes are moist. Neck: No stridor.  No meningeal signs.   Cardiovascular: Normal rate, regular rhythm. Good peripheral circulation. Grossly normal heart sounds.  Port is  present in the chest wall. Respiratory: Deep breaths, lung sounds are clear but diminished on the right. Gastrointestinal: Soft and nontender. No distention.  Musculoskeletal: No lower extremity tenderness nor edema. No gross deformities of extremities. Neurologic: Unable to participate in neurological exam but the patient opens eyes and localizes/withdraws from pain.  She is currently protecting her airway and has a gag reflex.  Technically her GCS is 8 but she is immediately post ictal and received Ativan 1 mg IV upon arrival. Skin:  Skin is warm, dry and intact. No rash noted.   ____________________________________________   LABS (all labs ordered are listed, but only abnormal results are displayed)  Labs Reviewed  GLUCOSE, CAPILLARY - Abnormal; Notable for the following components:      Result Value   Glucose-Capillary >600 (*)    All other components within normal limits  ACETAMINOPHEN LEVEL - Abnormal; Notable for the following components:   Acetaminophen (Tylenol), Serum <10 (*)    All other components within normal limits  COMPREHENSIVE METABOLIC PANEL - Abnormal; Notable for the  following components:   Sodium 125 (*)    Chloride 90 (*)    CO2 12 (*)    Glucose, Bld 1,554 (*)    BUN 52 (*)    Creatinine, Ser 6.00 (*)    Calcium 6.8 (*)    Total Protein 6.2 (*)    Albumin 2.5 (*)    Alkaline Phosphatase 131 (*)    GFR calc non Af Amer 7 (*)    GFR calc Af Amer 9 (*)    Anion gap 23 (*)    All other components within normal limits  LACTIC ACID, PLASMA - Abnormal; Notable for the following components:   Lactic Acid, Venous 5.7 (*)    All other components within normal limits  CBC WITH DIFFERENTIAL/PLATELET - Abnormal; Notable for the following components:   Hemoglobin 10.8 (*)    MCV 104.1 (*)    MCHC 24.9 (*)    RDW 17.1 (*)    All other components within normal limits  BLOOD GAS, ARTERIAL - Abnormal; Notable for the following components:   pH, Arterial 7.15 (*)    pO2, Arterial 137 (*)    Bicarbonate 11.1 (*)    Acid-base deficit 16.6 (*)    All other components within normal limits  ETHANOL  LIPASE, BLOOD  SALICYLATE LEVEL  TROPONIN I  PROTIME-INR  MAGNESIUM  LACTIC ACID, PLASMA  BETA-HYDROXYBUTYRIC ACID   ____________________________________________  EKG  ED ECG REPORT I, Hinda Kehr, the attending physician, personally viewed and interpreted this ECG.  Date: 03/02/2019 EKG Time: 00: 29 Rate: 103 Rhythm: Mild sinus tachycardia QRS Axis: normal Intervals: Slightly prolonged QTC at 506 ms ST/T Wave abnormalities: Nonspecific ST/T wave changes Narrative Interpretation: Nonspecific changes with no evidence of ischemia and no significant T wave abnormalities to suggest a profound electrolyte disturbance.  ____________________________________________  RADIOLOGY Ursula Alert, personally viewed and evaluated these images (plain radiographs) as part of my medical decision making, as well as reviewing the written report by the radiologist.  ED MD interpretation:  Stable right-sided pleural effusion.  No acute abnormalities identified by  radiologist on head CT.  Official radiology report(s): Dg Chest 1 View  Result Date: 03/02/2019 CLINICAL DATA:  Witness seizure. EXAM: CHEST  1 VIEW COMPARISON:  January 23, 2019 FINDINGS: There is a moderate right-sided pleural effusion with underlying opacity which is stable. Left Port-A-Cath in the right double-lumen catheter are in stable position. Stable cardiomegaly. No overt  edema. The left lung remains clear. No pneumothorax. IMPRESSION: Stable moderate right-sided pleural effusion with underlying opacity. Stable support apparatus. No acute interval change. Electronically Signed   By: Dorise Bullion III M.D   On: 03/02/2019 01:48   Ct Head Wo Contrast  Result Date: 03/02/2019 CLINICAL DATA:  New onset seizure EXAM: CT HEAD WITHOUT CONTRAST TECHNIQUE: Contiguous axial images were obtained from the base of the skull through the vertex without intravenous contrast. COMPARISON:  Head CT 12/18/2018 FINDINGS: Brain: There is no mass, hemorrhage or extra-axial collection. The size and configuration of the ventricles and extra-axial CSF spaces are normal. There is hypoattenuation of the white matter, most commonly indicating chronic small vessel disease. Small focus of mineralization in the central pons is unchanged. Vascular: No abnormal hyperdensity of the major intracranial arteries or dural venous sinuses. No intracranial atherosclerosis. Skull: The visualized skull base, calvarium and extracranial soft tissues are normal. Sinuses/Orbits: No fluid levels or advanced mucosal thickening of the visualized paranasal sinuses. No mastoid or middle ear effusion. The orbits are normal. IMPRESSION: 1. No acute intracranial abnormality. 2. Findings of chronic small vessel disease. Electronically Signed   By: Ulyses Jarred M.D.   On: 03/02/2019 02:46    ____________________________________________   PROCEDURES   Procedure(s) performed (including Critical Care):  .Critical Care Performed by: Hinda Kehr, MD Authorized by: Hinda Kehr, MD   Critical care provider statement:    Critical care time (minutes):  45   Critical care time was exclusive of:  Separately billable procedures and treating other patients   Critical care was necessary to treat or prevent imminent or life-threatening deterioration of the following conditions:  Metabolic crisis, endocrine crisis and CNS failure or compromise   Critical care was time spent personally by me on the following activities:  Development of treatment plan with patient or surrogate, discussions with consultants, evaluation of patient's response to treatment, examination of patient, obtaining history from patient or surrogate, ordering and performing treatments and interventions, ordering and review of laboratory studies, ordering and review of radiographic studies, pulse oximetry, re-evaluation of patient's condition and review of old charts     ____________________________________________   [place] / MDM / [place] / ED COURSE  As part of my medical decision making, I reviewed the following data within the [place] notes reviewed and incorporated, Labs reviewed , EKG interpreted , Old chart reviewed, Discussed with admitting physician  and Notes from prior ED visits  Alyssa Floyd was evaluated in Emergency Department on 03/02/2019 for the symptoms described in the history of present illness. She was evaluated in the context of the global COVID-19 pandemic, which necessitated consideration that the patient might be at risk for infection with the SARS-CoV-2 virus that causes COVID-19. Institutional protocols and algorithms that pertain to the evaluation of patients at risk for COVID-19 are in a state of rapid change based on information released by regulatory bodies including the CDC and federal and state organizations. These policies and algorithms were followed during the patient's care in the ED.       Differential diagnosis includes, but is not limited to, [name], hyperglycemic hyperosmotic state, acute infection, intracranial bleeding, metastatic lesions to the brain leading to seizures or worsening seizures, less likely ACS or PE.   I reviewed the medical record and the patient has a history of seizures as well as a history of a pleural effusion in addition to the other chronic medical issues listed above.  Her fingerstick blood sugar both for the paramedics and in the emergency department revealed greater than 600.  I am starting 2 L of IV fluids but will hold off on insulin treatment until I know the patient's potassium level.  I am sending off broad-spectrum of lab work and I will check a CT head without contrast to make sure there is no sign of tumor, edema/mass-effect, intracranial bleeding.  I am checking a 1 view chest x-ray to see if she still has a pleural effusion although her breathing at this time is appropriate under the circumstances.  Initially I was concerned she would need emergent intubation but she is protecting her airway and responding appropriately to stimuli at this time.  I will watch her carefully to make sure she is improving from the postictal state and continues to protect her airway.  I am giving Keppra 1 g IV and seizure precautions are in place.  Anticipate treatment for hyperglycemia/[name]/HHS once labs are back.  Her EKG is reassuring and there is no indication for emergent sodium bicarb or treatment for hyperkalemia at this time.  Clinical Course as of Mar 01 258  Sat Mar 02, 2019  0056 Glucose-Capillary(!!): >600 [CF]  0103 Based on the history, I have no concern for COVID-19.   [CF]  0116 pH, Arterial(!!): 7.15 [CF]  0131 The patient is noted to have a lactate>4. With the current information available to me, I don't think the patient is in septic shock. The lactate>4, is related to seizure activity   Lactic Acid, Venous(!!): 5.7 [CF]  0132 Labs not being  processed after about an hour. Called and spoke with Murray Hodgkins, apparently they had been overlooked.  She is running them now.   [CF]  0209 WBC: 7.1 [CF]  0227 Labs are notable for a glucose of 1554.  She has chronic kidney disease on dialysis so her BUN and creatinine are not as relevant.  CO2 is 12 and her anion gap is 23.  I have ordered another 2 L of normal saline and ordered insulin for the glucose stabilizer/[name] protocol.  I have also ordered 10 mEq of IV potassium as per protocol.  There is no evidence of any salicylate or acetaminophen overdose, troponin is normal, ethanol is negative.   [CF]  0229 I have paged the hospitalist to discuss.  The patient is now sleeping but will wake up to voice, answer simple questions and give her name and birthday.   [CF]  0235 I discussed the case by phone with Dr. Jannifer Franklin with the hospitalist service who will admit.   [CF]  4098 Initially as per the [name] protocol I had ordered potassium chloride 10 mEq IV per hour x2, but the pharmacist, Sheema, pointed out that with the patient being end-stage renal it is unlikely that it would be necessary.  I have canceled the order for potassium.   [CF]  0259 No acute abnormalities on head CT according to radiology  CT Head Wo Contrast [CF]    Clinical Course User Index [CF] Hinda Kehr, MD    ____________________________________________  FINAL CLINICAL IMPRESSION(S) / ED DIAGNOSES  Final diagnoses:  Diabetic ketoacidosis with coma associated with other specified diabetes mellitus ([place])  Seizure ([place])  ESRD (end stage renal disease) on dialysis ([place])  Elevated lactic acid level  Chronic pleural effusion     MEDICATIONS GIVEN DURING THIS VISIT:  Medications  insulin regular, human (MYXREDLIN) 100 units/ 100 mL infusion (has no administration in time range)  levETIRAcetam (  KEPPRA) IVPB 1000 mg/100 mL premix (0 mg Intravenous Stopped 03/02/19 0105)  sodium chloride 0.9 % bolus 2,000 mL (2,000 mLs Intravenous  New Bag/Given 03/02/19 0058)     ED Discharge Orders    None       Note:  This document was prepared using Dragon voice recognition software and may include unintentional dictation errors.   Hinda Kehr, MD 03/02/19 [phone]

## 2019-03-02 NOTE — Progress Notes (Signed)
[facility], [place] 03/02/19  Subjective:   Patient known to our practice from previous admission.  She was brought in from home by EMS for witnessed seizures.  Patient was noted to be acidotic with blood sugar of 1500.  She was diagnosed with [name] and admitted to ICU for further management.  Patient reports that her needle and syringe broke yesterday and last night she could not take insulin but she did eat dinner.  Last dialysis was Tuesday.  She missed on Thursday because of not feeling well and low blood sugars  Objective:  Vital signs in last 24 hours:  Temp:  [97.6 F (36.4 C)] 97.6 F (36.4 C) (04/04 0031) Pulse Rate:  [83-97] 83 (04/04 1000) Resp:  [12-22] 20 (04/04 1000) BP: (136-185)/(73-86) 147/73 (04/04 1000) SpO2:  [100 %] 100 % (04/04 1000)  Weight change:  There were no vitals filed for this visit.  Intake/Output:   No intake or output data in the 24 hours ending 03/02/19 1234   Physical Exam: General:  No acute distress, laying in the bed  HEENT  anicteric, moist oral mucous membranes  Neck supple  Pulm/lungs  normal breathing effort, clear to auscultation  CVS/Heart  regular rhythm, no rub  Abdomen:   Soft, nontender  Extremities:  No peripheral edema  Neurologic:  Alert, oriented  Skin:  No acute rashes  Access:  Right IJ PermCath       Basic Metabolic Panel:  Recent Labs  Lab 03/02/19 0030 03/02/19 1052  NA 125* 128*  K 4.9 3.2*  CL 90* 99  CO2 12* 17*  GLUCOSE 1,554* 904*  BUN 52* 54*  CREATININE 6.00* 6.19*  CALCIUM 6.8* 6.5*  MG 2.3  --      CBC: Recent Labs  Lab 03/02/19 0030 03/02/19 1052  WBC 7.1 7.2  NEUTROABS 5.7  --   HGB 10.8* 9.8*  HCT 43.3 32.6*  MCV 104.1* 87.2  PLT 161 159      Lab Results  Component Value Date   HEPBSAG Negative 01/22/2019      Microbiology:  No results found for this or any previous visit (from the past 240 hour(s)).  Coagulation Studies: Recent Labs     03/02/19 0030  LABPROT 13.5  INR 1.0    Urinalysis: No results for input(s): COLORURINE, LABSPEC, PHURINE, GLUCOSEU, HGBUR, BILIRUBINUR, KETONESUR, PROTEINUR, UROBILINOGEN, NITRITE, LEUKOCYTESUR in the last 72 hours.  Invalid input(s): APPERANCEUR    Imaging: Dg Chest 1 View  Result Date: 03/02/2019 CLINICAL DATA:  Witness seizure. EXAM: CHEST  1 VIEW COMPARISON:  January 23, 2019 FINDINGS: There is a moderate right-sided pleural effusion with underlying opacity which is stable. Left Port-A-Cath in the right double-lumen catheter are in stable position. Stable cardiomegaly. No overt edema. The left lung remains clear. No pneumothorax. IMPRESSION: Stable moderate right-sided pleural effusion with underlying opacity. Stable support apparatus. No acute interval change. Electronically Signed   By: Dorise Bullion III M.D   On: 03/02/2019 01:48   Ct Head Wo Contrast  Result Date: 03/02/2019 CLINICAL DATA:  New onset seizure EXAM: CT HEAD WITHOUT CONTRAST TECHNIQUE: Contiguous axial images were obtained from the base of the skull through the vertex without intravenous contrast. COMPARISON:  Head CT 12/18/2018 FINDINGS: Brain: There is no mass, hemorrhage or extra-axial collection. The size and configuration of the ventricles and extra-axial CSF spaces are normal. There is hypoattenuation of the white matter, most commonly indicating chronic small vessel disease. Small focus of mineralization  in the central pons is unchanged. Vascular: No abnormal hyperdensity of the major intracranial arteries or dural venous sinuses. No intracranial atherosclerosis. Skull: The visualized skull base, calvarium and extracranial soft tissues are normal. Sinuses/Orbits: No fluid levels or advanced mucosal thickening of the visualized paranasal sinuses. No mastoid or middle ear effusion. The orbits are normal. IMPRESSION: 1. No acute intracranial abnormality. 2. Findings of chronic small vessel disease. Electronically Signed    By: Ulyses Jarred M.D.   On: 03/02/2019 02:46     Medications:   . sodium chloride    . sodium chloride    . dextrose 5 % and 0.45% NaCl 75 mL/hr at 03/02/19 1051  . insulin    . potassium chloride     . heparin  5,000 Units Subcutaneous Q8H     Assessment/ Plan:  [name] with a type 1 diabetes, history of breast cancer last year, treated with lumpectomy, chemo, now on tamoxifen, end-stage renal disease, started dialysis in November 2019, admitted with [name] and seizures.  Recent admission in [place] for acute hypoxic respiratory failure, right pleural effusion, underwent thoracentesis 1.7 L on February 26  [place] [facility]/TTS/right IJ PermCath  1.  End-stage renal disease -We will arrange for dialysis while in the hospital  2.  Anemia of chronic kidney disease -Avoid Neupogen due to recent history of malignancy  3.  Hypokalemia -4K bath with dialysis  4.  [name]/Type 1 DM with CKD - AG 23, pH 7.15 -Management as per ICU team  6.  Secondary hyperparathyroidism -Continue home dose of binders with meals -monitor Phos   LOS: 0 Keeyon Privitera 4/4/202012:34 PM  [place], [place]  Note: This note was prepared with Dragon dictation. Any transcription errors are unintentional

## 2019-03-02 NOTE — Progress Notes (Signed)
Patient arrived from ED on stretcher, sleepy, but easily rousable,  IV insulin drip infusing at 10.8 , lab work ordered as bedside BG still reading above 600 . VSS on room air , seizure  pads in place. Room close to nurses station

## 2019-03-02 NOTE — H&P (Signed)
[facility] [place] NAME: Alyssa Floyd    MR#:  [id number]  DATE OF BIRTH:  03-Aug-1966  DATE OF ADMISSION:  03/02/2019  PRIMARY CARE PHYSICIAN: Hemming, Claudette Laws, MD   REQUESTING/REFERRING PHYSICIAN: Karma Greaser, MD  CHIEF COMPLAINT:   Chief Complaint  Patient presents with  . Seizures    HISTORY OF PRESENT ILLNESS:  Alyssa Floyd  is a 53 y.o. female who presents with chief complaint as above.  Patient presents the ED after seizure episode.  She is found to be in significant [name] here.  She is unable to contribute information to her history due to postictal state.  Per EMS she had 2 seizures at home.  She was treated with antiepileptics in the ED.  Hospitalist were called for admission  PAST MEDICAL HISTORY:   Past Medical History:  Diagnosis Date  . Breast cancer ([place])   . 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 Diabetes mellitus ([place])   . Diabetic nephropathy ([place])   . ESRD on dialysis ([place])   . Hyperlipidemia   . Hypertension   . Nephrotic syndrome    diabetic nephropathy, biopsy on 01/06/16 at [facility]  . Normocytic anemia   . Seizures ([place])   . Subclinical hypothyroidism   . Tobacco use   . Vitamin D deficiency      PAST SURGICAL HISTORY:   Past Surgical History:  Procedure Laterality Date  . BREAST SURGERY    . IR THORACENTESIS ASP PLEURAL SPACE W/IMG GUIDE  01/23/2019  . PARTIAL HYSTERECTOMY       SOCIAL HISTORY:   Social History   Tobacco Use  . Smoking status: Current Every Day Smoker    Packs/day: 0.50    Types: Cigarettes  . Smokeless tobacco: Never Used  Substance Use Topics  . Alcohol use: No     FAMILY HISTORY:   Family History  Problem Relation Age of Onset  . Diabetes Other      DRUG ALLERGIES:   Allergies  Allergen Reactions  . Penicillins Other (See Comments)    Pt reports hallucinations when taking penicillins. >Has patient had a PCN reaction causing  immediate rash, facial/tongue/throat swelling, SOB or lightheadedness with hypotension: No Has patient had a PCN reaction causing severe rash involving mucus membranes or skin necrosis: No Has patient had a PCN reaction that required hospitalization No Has patient had a PCN reaction occurring within the last 10 years: No If all of the above answers are "NO", then may proceed with Cephalosporin use.    MEDICATIONS AT HOME:   Prior to Admission medications   Medication Sig Start Date End Date Taking? Authorizing Provider  calcium acetate (PHOSLO) 667 MG capsule Take 1 capsule (667 mg total) by mouth 3 (three) times daily with meals. 09/07/18  Yes Earleen Newport, MD  insulin aspart (NOVOLOG) 100 UNIT/ML injection Inject 4 Units into the skin 3 (three) times daily with meals. Please take fingersticks reading more than 200 07/11/18  Yes Elgergawy, Silver Huguenin, MD  insulin glargine (LANTUS) 100 UNIT/ML injection Inject 0.1 mLs (10 Units total) into the skin daily. 01/27/19  Yes Regalado, Belkys A, MD  lisinopril (PRINIVIL,ZESTRIL) 20 MG tablet Take 20 mg by mouth daily.   Yes [provider]  atorvastatin (LIPITOR) 20 MG tablet Take 1 tablet by mouth daily. 08/31/18   [provider]  benzonatate (TESSALON) 200 MG capsule Take 1 capsule (200 mg total) by mouth  3 (three) times daily as needed for cough. Patient not taking: Reported on 03/02/2019 12/20/18   Mayo, Pete Pelt, MD  calcitRIOL (ROCALTROL) 0.5 MCG capsule Take 1 capsule by mouth daily. 10/05/18   [provider]  ferrous sulfate 325 (65 FE) MG EC tablet Take 1 tablet (325 mg total) by mouth daily. 01/26/19 01/26/20  Regalado, Belkys A, MD  levETIRAcetam (KEPPRA) 1000 MG tablet Take 1 tablet (1,000 mg total) by mouth daily. 12/20/18   Mayo, Pete Pelt, MD  levETIRAcetam (KEPPRA) 500 MG tablet Take 1 tablet (500mg ) by mouth after dialysis on Tuesday, Thursday, and Saturday 12/20/18   Mayo, Pete Pelt, MD  tamoxifen (NOLVADEX)  20 MG tablet Take 20 mg by mouth daily.    [provider]    REVIEW OF SYSTEMS:  Review of Systems  Unable to perform ROS: Acuity of condition     VITAL SIGNS:   Vitals:   03/02/19 0031 03/02/19 0032  BP: (!) 185/86   Pulse: 97   Temp: 97.6 F (36.4 C)   TempSrc: Axillary   SpO2: 100% 100%   Wt Readings from Last 3 Encounters:  01/26/19 63.5 kg  12/20/18 72.8 kg  09/07/18 70.7 kg    PHYSICAL EXAMINATION:  Physical Exam  Vitals reviewed. Constitutional: She appears well-developed and well-nourished. No distress.  HENT:  Head: Normocephalic and atraumatic.  Mouth/Throat: Oropharynx is clear and moist.  Eyes: Pupils are equal, round, and reactive to light. Conjunctivae and EOM are normal. No scleral icterus.  Neck: Normal range of motion. Neck supple. No JVD present. No thyromegaly present.  Cardiovascular: Normal rate, regular rhythm and intact distal pulses. Exam reveals no gallop and no friction rub.  No murmur heard. Respiratory: Effort normal and breath sounds normal. No respiratory distress. She has no wheezes. She has no rales.  GI: Soft. Bowel sounds are normal. She exhibits no distension. There is no abdominal tenderness.  Musculoskeletal: Normal range of motion.        General: No edema.     Comments: No arthritis, no gout  Lymphadenopathy:    She has no cervical adenopathy.  Neurological: No cranial nerve deficit.  Unable to fully assess due to patient condition  Skin: Skin is warm and dry. No rash noted. No erythema.  Psychiatric:  Unable to assess due to patient condition    LABORATORY PANEL:   CBC Recent Labs  Lab 03/02/19 0030  WBC 7.1  HGB 10.8*  HCT 43.3  PLT 161   ------------------------------------------------------------------------------------------------------------------  Chemistries  Recent Labs  Lab 03/02/19 0030  NA 125*  K 4.9  CL 90*  CO2 12*  GLUCOSE 1,554*  BUN 52*  CREATININE 6.00*  CALCIUM 6.8*  MG 2.3   AST 26  ALT 19  ALKPHOS 131*  BILITOT 0.6   ------------------------------------------------------------------------------------------------------------------  Cardiac Enzymes Recent Labs  Lab 03/02/19 0030  TROPONINI <0.03   ------------------------------------------------------------------------------------------------------------------  RADIOLOGY:  Dg Chest 1 View  Result Date: 03/02/2019 CLINICAL DATA:  Witness seizure. EXAM: CHEST  1 VIEW COMPARISON:  January 23, 2019 FINDINGS: There is a moderate right-sided pleural effusion with underlying opacity which is stable. Left Port-A-Cath in the right double-lumen catheter are in stable position. Stable cardiomegaly. No overt edema. The left lung remains clear. No pneumothorax. IMPRESSION: Stable moderate right-sided pleural effusion with underlying opacity. Stable support apparatus. No acute interval change. Electronically Signed   By: Dorise Bullion III M.D   On: 03/02/2019 01:48   Ct Head Wo Contrast  Result Date:  03/02/2019 CLINICAL DATA:  New onset seizure EXAM: CT HEAD WITHOUT CONTRAST TECHNIQUE: Contiguous axial images were obtained from the base of the skull through the vertex without intravenous contrast. COMPARISON:  Head CT 12/18/2018 FINDINGS: Brain: There is no mass, hemorrhage or extra-axial collection. The size and configuration of the ventricles and extra-axial CSF spaces are normal. There is hypoattenuation of the white matter, most commonly indicating chronic small vessel disease. Small focus of mineralization in the central pons is unchanged. Vascular: No abnormal hyperdensity of the major intracranial arteries or dural venous sinuses. No intracranial atherosclerosis. Skull: The visualized skull base, calvarium and extracranial soft tissues are normal. Sinuses/Orbits: No fluid levels or advanced mucosal thickening of the visualized paranasal sinuses. No mastoid or middle ear effusion. The orbits are normal. IMPRESSION: 1. No  acute intracranial abnormality. 2. Findings of chronic small vessel disease. Electronically Signed   By: Ulyses Jarred M.D.   On: 03/02/2019 02:46    EKG:   Orders placed or performed during the hospital encounter of 03/02/19  . EKG 12-Lead  . EKG 12-Lead    IMPRESSION AND PLAN:  Principal Problem:   [name] (diabetic ketoacidoses) ([place]) -admit to ICU per [name] admission order set Active Problems:   Seizure disorder ([place]) -IV Keppra given, continue home dose antiepileptics   ESRD (end stage renal disease) on dialysis [facility]) -nephrology consult for dialysis support   Hyperlipidemia -home dose antilipid   Breast cancer metastasized to axillary lymph node, right ([place]) -home dose tamoxifen  Chart review performed and case discussed with ED provider. Labs, imaging and/or ECG reviewed by provider and discussed with patient/family. Management plans discussed with the patient and/or family.  DVT PROPHYLAXIS: SubQ heparin  GI PROPHYLAXIS:  None  ADMISSION STATUS: Inpatient     CODE STATUS: Full Code Status History    Date Active Date Inactive Code Status Order ID Comments User Context   01/22/2019 0022 01/26/2019 2209 Full Code [id number]  Shela Leff, MD Inpatient   12/18/2018 0135 12/20/2018 2017 Full Code [id number]  Sedalia Muta, MD ED   08/14/2018 1907 08/16/2018 2155 Full Code [id number]  Demetrios Loll, MD ED   08/11/2018 1355 08/14/2018 0044 Full Code [id number]  Loletha Grayer, MD ED   07/08/2018 0437 07/11/2018 2132 Full Code [id number]  Omar Person, NP ED   01/29/2016 1316 02/01/2016 1530 Full Code [id number]  Juluis Mire, MD ED      TOTAL CRITICAL CARE TIME TAKING CARE OF THIS PATIENT: 50 minutes.   Ethlyn Daniels 03/02/2019, 3:15 AM  [facility]  Office  [phone]  CC: Primary care physician; Janace Aris Claudette Laws, MD  Note:  This document was prepared using Dragon voice recognition software and may include unintentional dictation errors.

## 2019-03-02 NOTE — Progress Notes (Signed)
Returned blood with 291ml ns  to enable changing out venous chamber due to large clot. Venous pressure >360 despite flush.

## 2019-03-02 NOTE — ED Notes (Signed)
ED TO INPATIENT HANDOFF REPORT  ED Nurse Name and Phone #: Yong Channel [phone]  S Name/Age/Gender Alyssa Floyd 53 y.o. female Room/Bed: ED25A/ED25A  Code Status   Code Status: Prior  Home/SNF/Other Home Patient oriented to: self, place, time and situation Is this baseline? Yes   Triage Complete: Triage complete  Chief Complaint [facility] EMS Seizure  Triage Note Pt arrived via EMS from home where pt had witnessed seizures x2. Pt had 3rd witnessed seizure while coming into ED. Pt has hx/o cancer, renal failure (on dialysis,) DM. Pt is postictal in RM. MD at bedside.     Allergies Allergies  Allergen Reactions  . Penicillins Other (See Comments)    Pt reports hallucinations when taking penicillins. >Has patient had a PCN reaction causing immediate rash, facial/tongue/throat swelling, SOB or lightheadedness with hypotension: No Has patient had a PCN reaction causing severe rash involving mucus membranes or skin necrosis: No Has patient had a PCN reaction that required hospitalization No Has patient had a PCN reaction occurring within the last 10 years: No If all of the above answers are "NO", then may proceed with Cephalosporin use.    Level of Care/Admitting Diagnosis ED Disposition    ED Disposition Condition [facility] Area: [place] [[id number]]  Level of Care: ICU [6]  Diagnosis: [name] (diabetic ketoacidoses) [facility]) [[id number]]  Admitting Physician: Lance Coon [[id number]]  Attending Physician: Jannifer Franklin, DAVID [phone]  Estimated length of stay: past midnight tomorrow  Certification:: I certify this patient will need inpatient services for at least 2 midnights  PT Class (Do Not Modify): Inpatient [101]  PT Acc Code (Do Not Modify): Private [1]       B Medical/Surgery History Past Medical History:  Diagnosis Date  . Breast cancer ([place])   . 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 Diabetes mellitus ([place])   .  Diabetic nephropathy ([place])   . ESRD on dialysis ([place])   . Hyperlipidemia   . Hypertension   . Nephrotic syndrome    diabetic nephropathy, biopsy on 01/06/16 at [facility]  . Normocytic anemia   . Seizures ([place])   . Subclinical hypothyroidism   . Tobacco use   . Vitamin D deficiency    Past Surgical History:  Procedure Laterality Date  . BREAST SURGERY    . IR THORACENTESIS ASP PLEURAL SPACE W/IMG GUIDE  01/23/2019  . PARTIAL HYSTERECTOMY       A IV Location/Drains/Wounds Patient Lines/Drains/Airways Status   Active Line/Drains/Airways    Name:   Placement date:   Placement time:   Site:   Days:   Implanted Port Left Chest   -    -    Chest      Implanted Port 03/02/19 Left Chest   03/02/19    0254    Chest   less than 1   Peripheral IV 03/02/19 Right Antecubital   03/02/19    0034    Antecubital   less than 1   Hemodialysis Catheter Right Subclavian Double-lumen   12/18/18    0739    Subclavian   74          Intake/Output Last 24 hours No intake or output data in the 24 hours ending 03/02/19 5277  Labs/Imaging Results for orders placed or performed during the hospital encounter of 03/02/19 (from the past 48 hour(s))  Glucose, capillary     Status: Abnormal   Collection Time: 03/02/19 12:28 AM  Result  Value Ref Range   Glucose-Capillary >600 (HH) 70 - 99 mg/dL  Acetaminophen level     Status: Abnormal   Collection Time: 03/02/19 12:30 AM  Result Value Ref Range   Acetaminophen (Tylenol), Serum <10 (L) 10 - 30 ug/mL    Comment: (NOTE) Therapeutic concentrations vary significantly. A range of 10-30 ug/mL  may be an effective concentration for many patients. However, some  are best treated at concentrations outside of this range. Acetaminophen concentrations >150 ug/mL at 4 hours after ingestion  and >50 ug/mL at 12 hours after ingestion are often associated with  toxic reactions. Performed at [facility], [address] [place], [place] [address]    Comprehensive metabolic panel     Status: Abnormal   Collection Time: 03/02/19 12:30 AM  Result Value Ref Range   Sodium 125 (L) 135 - 145 mmol/L    Comment: LYTES REPEATED FMW   Potassium 4.9 3.5 - 5.1 mmol/L   Chloride 90 (L) 98 - 111 mmol/L   CO2 12 (L) 22 - 32 mmol/L   Glucose, Bld 1,554 (HH) 70 - 99 mg/dL    Comment: RESULT CONFIRMED BY MANUAL DILUTION FMW CRITICAL RESULT CALLED TO, READ BACK BY AND VERIFIED WITH JOHN HOFFMASTER @0221  ON 03/02/2019 BY FMW    BUN 52 (H) 6 - 20 mg/dL   Creatinine, Ser 6.00 (H) 0.44 - 1.00 mg/dL   Calcium 6.8 (L) 8.9 - 10.3 mg/dL   Total Protein 6.2 (L) 6.5 - 8.1 g/dL   Albumin 2.5 (L) 3.5 - 5.0 g/dL   AST 26 15 - 41 U/L   ALT 19 0 - 44 U/L   Alkaline Phosphatase 131 (H) 38 - 126 U/L   Total Bilirubin 0.6 0.3 - 1.2 mg/dL   GFR calc non Af Amer 7 (L) >60 mL/min   GFR calc Af Amer 9 (L) >60 mL/min   Anion gap 23 (H) 5 - 15    Comment: Performed at [facility], [address] [place], [place] [address]  Ethanol     Status: None   Collection Time: 03/02/19 12:30 AM  Result Value Ref Range   Alcohol, Ethyl (B) <10 <10 mg/dL    Comment: (NOTE) Lowest detectable limit for serum alcohol is 10 mg/dL. For medical purposes only. Performed at [facility], [address] [place], [place] [address]   Lipase, blood     Status: None   Collection Time: 03/02/19 12:30 AM  Result Value Ref Range   Lipase 25 11 - 51 U/L    Comment: Performed at [facility], [address] [place], [place] [address]  Salicylate level     Status: None   Collection Time: 03/02/19 12:30 AM  Result Value Ref Range   Salicylate Lvl <6.2 2.8 - 30.0 mg/dL    Comment: Performed at [facility], [address] [place], [place] [address]  Troponin I - Once     Status: None   Collection Time: 03/02/19 12:30 AM  Result Value Ref Range   Troponin I <0.03 <0.03 ng/mL    Comment: Performed at [facility], [address] [place], [place] [address]  CBC with Differential     Status: Abnormal   Collection Time: 03/02/19 12:30 AM  Result Value Ref Range   WBC 7.1 4.0 - 10.5 K/uL   RBC 4.16 3.87 - 5.11 MIL/uL   Hemoglobin 10.8 (L) 12.0 - 15.0 g/dL   HCT 43.3 36.0 - 46.0 %  MCV 104.1 (H) 80.0 - 100.0 fL   MCH 26.0 26.0 - 34.0 pg   MCHC 24.9 (L) 30.0 - 36.0 g/dL   RDW 17.1 (H) 11.5 - 15.5 %   Platelets 161 150 - 400 K/uL   nRBC 0.0 0.0 - 0.2 %   Neutrophils Relative % 81 %   Neutro Abs 5.7 1.7 - 7.7 K/uL   Lymphocytes Relative 10 %   Lymphs Abs 0.7 0.7 - 4.0 K/uL   Monocytes Relative 7 %   Monocytes Absolute 0.5 0.1 - 1.0 K/uL   Eosinophils Relative 1 %   Eosinophils Absolute 0.1 0.0 - 0.5 K/uL   Basophils Relative 1 %   Basophils Absolute 0.1 0.0 - 0.1 K/uL   Immature Granulocytes 0 %   Abs Immature Granulocytes 0.02 0.00 - 0.07 K/uL    Comment: Performed at [facility], [address] [place], [place] [address]  Protime-INR     Status: None   Collection Time: 03/02/19 12:30 AM  Result Value Ref Range   Prothrombin Time 13.5 11.4 - 15.2 seconds   INR 1.0 0.8 - 1.2    Comment: (NOTE) INR goal varies based on device and disease states. Performed at [facility], [address] [place], [place] [address]   Beta-hydroxybutyric acid     Status: Abnormal   Collection Time: 03/02/19 12:30 AM  Result Value Ref Range   Beta-Hydroxybutyric Acid 3.60 (H) 0.05 - 0.27 mmol/L    Comment: RESULT CONFIRMED BY MANUAL DILUTION FMW Performed at [facility], [address] [place], [place] [address]   Magnesium     Status: None   Collection Time: 03/02/19 12:30 AM  Result Value Ref Range   Magnesium 2.3 1.7 - 2.4 mg/dL    Comment: Performed at [facility], [address] [place], [place] [address]  Lactic acid, plasma     Status: Abnormal   Collection Time: 03/02/19 12:38 AM  Result Value Ref Range   Lactic Acid, Venous 5.7 (HH) 0.5 - 1.9 mmol/L    Comment:  CRITICAL RESULT CALLED TO, READ BACK BY AND VERIFIED WITH [place] HOFFMASTER AT  0116 03/02/2019.  TFK Performed at [facility], [address] [place], [place] [address]   Blood gas, arterial     Status: Abnormal (Preliminary result)   Collection Time: 03/02/19 12:56 AM  Result Value Ref Range   FIO2 100.00    pH, Arterial 7.15 (LL) 7.350 - 7.450    Comment: CRITICAL RESULT CALLED TO, READ BACK BY AND VERIFIED WITH: CORY FORBACH MD AT 0110 ON 03/02/2019    pCO2 arterial 32 32.0 - 48.0 mmHg   pO2, Arterial 137 (H) 83.0 - 108.0 mmHg   Bicarbonate 11.1 (L) 20.0 - 28.0 mmol/L   Acid-base deficit 16.6 (H) 0.0 - 2.0 mmol/L   O2 Saturation 98.3 %   Patient temperature 37.0    Collection site LEFT RADIAL    Sample type ARTERIAL DRAW    Allens test (pass/fail) PASS PASS    Comment: Performed at [facility], [address] [place], [place] [address]   Mechanical Rate PENDING   Lactic acid, plasma     Status: None   Collection Time: 03/02/19  3:04 AM  Result Value Ref Range   Lactic Acid, Venous 1.9 0.5 - 1.9 mmol/L    Comment: Performed at [facility], [address] [place], [place] [address]  Glucose, capillary     Status: Abnormal  Collection Time: 03/02/19  4:07 AM  Result Value Ref Range   Glucose-Capillary >600 (HH) 70 - 99 mg/dL  Glucose, capillary     Status: Abnormal   Collection Time: 03/02/19  4:57 AM  Result Value Ref Range   Glucose-Capillary >600 (HH) 70 - 99 mg/dL  Glucose, capillary     Status: Abnormal   Collection Time: 03/02/19  6:05 AM  Result Value Ref Range   Glucose-Capillary >600 (HH) 70 - 99 mg/dL  Glucose, capillary     Status: Abnormal   Collection Time: 03/02/19  7:29 AM  Result Value Ref Range   Glucose-Capillary >600 (HH) 70 - 99 mg/dL  Glucose, capillary     Status: Abnormal   Collection Time: 03/02/19  8:59 AM  Result Value Ref Range   Glucose-Capillary >600 (HH) 70 - 99 mg/dL   Dg Chest 1 View  Result Date:  03/02/2019 CLINICAL DATA:  Witness seizure. EXAM: CHEST  1 VIEW COMPARISON:  January 23, 2019 FINDINGS: There is a moderate right-sided pleural effusion with underlying opacity which is stable. Left Port-A-Cath in the right double-lumen catheter are in stable position. Stable cardiomegaly. No overt edema. The left lung remains clear. No pneumothorax. IMPRESSION: Stable moderate right-sided pleural effusion with underlying opacity. Stable support apparatus. No acute interval change. Electronically Signed   By: Dorise Bullion III M.D   On: 03/02/2019 01:48   Ct Head Wo Contrast  Result Date: 03/02/2019 CLINICAL DATA:  New onset seizure EXAM: CT HEAD WITHOUT CONTRAST TECHNIQUE: Contiguous axial images were obtained from the base of the skull through the vertex without intravenous contrast. COMPARISON:  Head CT 12/18/2018 FINDINGS: Brain: There is no mass, hemorrhage or extra-axial collection. The size and configuration of the ventricles and extra-axial CSF spaces are normal. There is hypoattenuation of the white matter, most commonly indicating chronic small vessel disease. Small focus of mineralization in the central pons is unchanged. Vascular: No abnormal hyperdensity of the major intracranial arteries or dural venous sinuses. No intracranial atherosclerosis. Skull: The visualized skull base, calvarium and extracranial soft tissues are normal. Sinuses/Orbits: No fluid levels or advanced mucosal thickening of the visualized paranasal sinuses. No mastoid or middle ear effusion. The orbits are normal. IMPRESSION: 1. No acute intracranial abnormality. 2. Findings of chronic small vessel disease. Electronically Signed   By: Ulyses Jarred M.D.   On: 03/02/2019 02:46    Pending Labs [employer] (From admission, onward)    Start     Ordered   Signed and Corporate treasurer  STAT Now then every 4 hours ,   STAT     Signed and Held   Signed and Held  CBC  (heparin)  Once,   R    Comments:  Baseline  for heparin therapy IF NOT ALREADY DRAWN.  Notify MD if PLT < 100 K.    Signed and Held   Signed and Held  Creatinine, serum  (heparin)  Once,   R    Comments:  Baseline for heparin therapy IF NOT ALREADY DRAWN.    Signed and Held          Vitals/Pain Today's Vitals   03/02/19 0730 03/02/19 0745 03/02/19 0800 03/02/19 0900  BP:    (!) 156/80  Pulse: 91 89 88 86  Resp: 19 17 12 20   Temp:      TempSrc:      SpO2: 100% 100% 100% 100%    Isolation Precautions No active isolations  Medications Medications  insulin regular, human (MYXREDLIN) 100 units/ 100 mL infusion (21.6 Units/hr Intravenous Rate/Dose Change 03/02/19 0901)  levETIRAcetam (KEPPRA) IVPB 1000 mg/100 mL premix (0 mg Intravenous Stopped 03/02/19 0105)  sodium chloride 0.9 % bolus 2,000 mL (0 mLs Intravenous Stopped 03/02/19 0902)    Mobility walks High fall risk   Focused Assessments neuro    R Recommendations: See Admitting Provider Note  Report given to:   Additional Notes: n/a

## 2019-03-02 NOTE — Progress Notes (Signed)
Inpatient Diabetes Program Recommendations  AACE/ADA: New Consensus Statement on Inpatient Glycemic Control (2015)  Target Ranges:  Prepandial:   less than 140 mg/dL      Peak postprandial:   less than 180 mg/dL (1-2 hours)      Critically ill patients:  140 - 180 mg/dL   Lab Results  Component Value Date   GLUCAP >600 (HH) 03/02/2019   HGBA1C 9.2 (H) 01/22/2019    Review of Glycemic Control Results for Alyssa Floyd, Alyssa Floyd (MRN [id number]) as of 03/02/2019 09:59  Ref. Range 03/02/2019 04:57 03/02/2019 06:05 03/02/2019 07:29 03/02/2019 08:59  Glucose-Capillary Latest Ref Range: 70 - 99 mg/dL >600 (HH) >600 (HH) >600 (HH) >600 (HH)   Diabetes history: Type 1 DM Outpatient Diabetes medications: Novolog 4 units TID, Lantus 10 units QD Current orders for Inpatient glycemic control: IV insulin  Inpatient Diabetes Program Recommendations:    Noted patient continues to exceed >601 mg/dL. Called and spoke with RN. Educated on need to restart glucostabilizer program, reviewed [name] side bar for further information and stressed the importance of resetting the multiplier to 0.01 per protocol. RN able to view and plans to reset program. Encouraged to reach out with further questions. In agreement with current plan for IV insulin. When ready to transition, please note patient is a type 1 DM and requires basal insulin 2 hours prior to discontinuation of insulin drip, in addition to correction and meal coverage. Will continue to follow.  Thanks, Bronson Curb, MSN, RNC-OB Diabetes Coordinator [phone] (8a-5p)

## 2019-03-02 NOTE — Progress Notes (Signed)
Still having dialysis, remains sleepy a, but easily rous able, Skin warm and dry, BG 81, IV insulin drip is off, food ordered.

## 2019-03-02 NOTE — Progress Notes (Signed)
Order placed for hep ag.

## 2019-03-02 NOTE — Progress Notes (Signed)
This note also relates to the following rows which could not be included: Pulse Rate - Cannot attach notes to unvalidated device data Resp - Cannot attach notes to unvalidated device data BP - Cannot attach notes to unvalidated device data SpO2 - Cannot attach notes to unvalidated device data  High venous pressure 194ml ns flush

## 2019-03-02 NOTE — Consult Note (Signed)
Name: Alyssa Floyd MRN: [id number] DOB: 1966-05-05    ADMISSION DATE:  03/02/2019 CONSULTATION DATE: 03/02/2019  REFERRING MD : Dr. 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  She presented to [facility] ER on 04/4 following 2 brief episodes of seizure activity at home and subsequently becoming postictal.  Per ER notes upon EMS arrival pt somnolent, however able to answer questions.  En route and upon arrival to the ER she had another generalized seizure, symptoms resolved prior to medication administration.  She received 1 gram of IV Keppra.  Lab results revealed Na+ 125, glucose 1,554, BUN 52, creatinine 6.00, calcium 6.8, anion gap 23, alk phos 131, albumin 2.5, troponin <0.03, lactic acid 5.7, and hgb 10.8.  CXR concerning for moderate right sided pleural effusion.  She was subsequently started on an insulin gtt and admitted to ICU by hospitalist team for additional workup and treatment   PAST MEDICAL HISTORY :   has a past medical history of Breast cancer ([place]), Chronic diarrhea, Diabetes mellitus ([place]), Diabetic nephropathy ([place]), ESRD on dialysis ([place]), Hyperlipidemia, Hypertension, Nephrotic syndrome, Normocytic anemia, Seizures ([place]), Subclinical hypothyroidism, Tobacco use, and Vitamin D deficiency.  has a past surgical history that includes Partial hysterectomy; Breast surgery; and IR THORACENTESIS ASP PLEURAL SPACE W/IMG GUIDE (01/23/2019). Prior to Admission medications   Medication Sig  Start Date End Date Taking? Authorizing Provider  calcium acetate (PHOSLO) 667 MG capsule Take 1 capsule (667 mg total) by mouth 3 (three) times daily with meals. 09/07/18  Yes Earleen Newport, MD  insulin aspart (NOVOLOG) 100 UNIT/ML injection Inject 4 Units into the skin 3 (three) times daily with meals. Please take fingersticks reading more than 200 07/11/18  Yes Elgergawy, Silver Huguenin, MD  insulin glargine (LANTUS) 100 UNIT/ML injection Inject 0.1 mLs (10 Units total) into the skin daily. 01/27/19  Yes Regalado, Belkys A, MD  lisinopril (PRINIVIL,ZESTRIL) 20 MG tablet Take 20 mg by mouth daily.   Yes [provider]  atorvastatin (LIPITOR) 20 MG tablet Take 1 tablet by mouth daily. 08/31/18   [provider]  benzonatate (TESSALON) 200 MG capsule Take 1 capsule (200 mg total) by mouth 3 (three) times daily as needed for cough. Patient not taking: Reported on 03/02/2019 12/20/18   Mayo, Pete Pelt, MD  calcitRIOL (ROCALTROL) 0.5 MCG capsule Take 1 capsule by mouth daily. 10/05/18   [provider]  ferrous sulfate 325 (65 FE) MG EC tablet Take 1 tablet (325 mg total) by mouth daily. 01/26/19 01/26/20  Regalado, Belkys A, MD  levETIRAcetam (KEPPRA) 1000 MG tablet Take 1 tablet (1,000 mg total) by mouth daily. 12/20/18   Mayo, Pete Pelt, MD  levETIRAcetam (KEPPRA) 500 MG tablet Take 1 tablet (556m) by mouth after dialysis on Tuesday, Thursday, and Saturday 12/20/18   Mayo, KPete Pelt MD  tamoxifen (NOLVADEX) 20 MG tablet Take 20 mg by mouth daily.    [provider]   Allergies  Allergen Reactions  . Penicillins Other (See Comments)    Pt reports hallucinations when  taking penicillins. >Has patient had a PCN reaction causing immediate rash, facial/tongue/throat swelling, SOB or lightheadedness with hypotension: No Has patient had a PCN reaction causing severe rash involving mucus membranes or skin necrosis: No Has patient had a PCN reaction that required hospitalization  No Has patient had a PCN reaction occurring within the last 10 years: No If all of the above answers are "NO", then may proceed with Cephalosporin use.    FAMILY HISTORY:  family history includes Diabetes in an other family member. SOCIAL HISTORY:  reports that she has been smoking cigarettes. She has been smoking about 0.50 packs per day. She has never used smokeless tobacco. She reports that she does not drink alcohol or use drugs.  REVIEW OF SYSTEMS:  Positives in BOLD  Constitutional: Negative for fever, chills, weight loss, malaise/fatigue and diaphoresis.  HENT: Negative for hearing loss, ear pain, nosebleeds, congestion, sore throat, neck pain, tinnitus and ear discharge.   Eyes: Negative for blurred vision, double vision, photophobia, pain, discharge and redness.  Respiratory: Negative for cough, hemoptysis, sputum production, shortness of breath, wheezing and stridor.   Cardiovascular: Negative for chest pain, palpitations, orthopnea, claudication, leg swelling and PND.  Gastrointestinal: Negative for heartburn, nausea, vomiting, abdominal pain, diarrhea, constipation, blood in stool and melena.  Genitourinary: Negative for dysuria, urgency, frequency, hematuria and flank pain.  Musculoskeletal: Negative for myalgias, back pain, joint pain and falls.  Skin: Negative for itching and rash.  Neurological: Negative for dizziness, tingling, tremors, sensory change, speech change, focal weakness, seizures, loss of consciousness, weakness and headaches.  Endo/Heme/Allergies: Negative for environmental allergies and polydipsia. Does not bruise/bleed easily.  SUBJECTIVE:   VITAL SIGNS: Temp:  [97.6 F (36.4 C)] 97.6 F (36.4 C) (04/04 0031) Pulse Rate:  [94-97] 94 (04/04 0200) Resp:  [22] 22 (04/04 0200) BP: (136-185)/(73-86) 136/73 (04/04 0200) SpO2:  [100 %] 100 % (04/04 0200)  PHYSICAL EXAMINATION: General: Mildly confused this morning but in no distress Neuro: No focal  deficits able to move all 4 extremities HEENT: No grossly visible neck lesions eyes are equal round, no nasal lesions Cardiovascular: Sinus tachycardia no murmurs appreciated Lungs: Diminished lung sounds on the right, without wheezing or rhonchorous breath sounds bilaterally Abdomen: Mildly obese abdomen nontender positive bowel sounds in all 4 quadrants Musculoskeletal: No grossly appreciable lesions or open skin wounds mildly edematous at the ankles Skin: No rash appreciated  Recent Labs  Lab 03/02/19 0030  NA 125*  K 4.9  CL 90*  CO2 12*  BUN 52*  CREATININE 6.00*  GLUCOSE 1,554*   Recent Labs  Lab 03/02/19 0030  HGB 10.8*  HCT 43.3  WBC 7.1  PLT 161   Dg Chest 1 View  Result Date: 03/02/2019 CLINICAL DATA:  Witness seizure. EXAM: CHEST  1 VIEW COMPARISON:  January 23, 2019 FINDINGS: There is a moderate right-sided pleural effusion with underlying opacity which is stable. Left Port-A-Cath in the right double-lumen catheter are in stable position. Stable cardiomegaly. No overt edema. The left lung remains clear. No pneumothorax. IMPRESSION: Stable moderate right-sided pleural effusion with underlying opacity. Stable support apparatus. No acute interval change. Electronically Signed   By: Dorise Bullion III M.D   On: 03/02/2019 01:48   Ct Head Wo Contrast  Result Date: 03/02/2019 CLINICAL DATA:  New onset seizure EXAM: CT HEAD WITHOUT CONTRAST TECHNIQUE: Contiguous axial images were obtained from the base of the skull through the vertex without intravenous contrast. COMPARISON:  Head CT 12/18/2018 FINDINGS: Brain: There is no  mass, hemorrhage or extra-axial collection. The size and configuration of the ventricles and extra-axial CSF spaces are normal. There is hypoattenuation of the white matter, most commonly indicating chronic small vessel disease. Small focus of mineralization in the central pons is unchanged. Vascular: No abnormal hyperdensity of the major intracranial arteries  or dural venous sinuses. No intracranial atherosclerosis. Skull: The visualized skull base, calvarium and extracranial soft tissues are normal. Sinuses/Orbits: No fluid levels or advanced mucosal thickening of the visualized paranasal sinuses. No mastoid or middle ear effusion. The orbits are normal. IMPRESSION: 1. No acute intracranial abnormality. 2. Findings of chronic small vessel disease. Electronically Signed   By: Ulyses Jarred M.D.   On: 03/02/2019 02:46    ASSESSMENT / PLAN:  Acute on chronic right sided pleural effusion  Supplemental O2 for dyspnea and/or hypoxia  Repeat CXR in am   Hypertension  Continuous telemetry monitoring  Prn hydralazine for bp management   ESRD on HD Lactic acidosis-resolved  Trend BMP Replace electrolytes as indicated Avoid nephrotoxic medications  Nephrology consulted appreciate input-HD per recommendations   Diabetic Ketoacidosis  Continue insulin gtt until anion gap closed and serum CO2 >20 BMP q4hrs and CBG q1hr while on insulin gtt  Diabetes coordinator consulted appreciate input   Seizures Continue keppra  EEG pending  Neurology consulted appreciate input  Seizure precautions     Ottie Glazier, M.D.  [facility] [place]    Critical care provider statement:    Critical care time (minutes):  35   Critical care time was exclusive of:  Separately billable procedures and  treating other patients   Critical care was necessary to treat or prevent imminent or  life-threatening deterioration of the following conditions:   Diabetic ketoacidosis, seizure disorder, right pleural effusion, multiple electrolyte abnormalities   Critical care was time spent personally by me on the following  activities:  Development of treatment plan with patient or surrogate,  discussions with consultants, evaluation of patient's response to  treatment, examination of patient, obtaining history from patient or   surrogate, ordering and performing treatments and interventions, ordering  and review of laboratory studies and re-evaluation of patient's condition   I assumed direction of critical care for this patient from another  provider in my specialty: no

## 2019-03-02 NOTE — ED Triage Notes (Signed)
Pt arrived via EMS from home where pt had witnessed seizures x2. Pt had 3rd witnessed seizure while coming into ED. Pt has hx/o cancer, renal failure (on dialysis,) DM. Pt is postictal in RM. MD at bedside.

## 2019-03-02 NOTE — Progress Notes (Signed)
Patient ID: Alyssa Floyd, female   DOB: 09/19/1966, 53 y.o.   MRN: [id number] patient examined chart reviewed. Came in to the emergency room with labile sugars. According the patient on Thursday her sugar was down in the 50s. She missed her dialysis. Then she ran out of her testing strips and did not take her insulin appropriately came in with [name] and blood glucose of 1500. She is admitted contently on IV fluids and IV insulin drip.  Discussed with Dr. Candiss Norse to resume in-house hemodialysis. Patient gets dialysis on Tuesday Thursday and Saturday. Her diet last dialysis was past Tuesday.

## 2019-03-02 NOTE — Plan of Care (Signed)
Pt is lethargic, post seizure, stats has not taken any meds in 2 days

## 2019-03-02 NOTE — Consult Note (Signed)
CRITICAL CARE NOTE        SUBJECTIVE FINDINGS & SIGNIFICANT EVENTS   Patient's mental status has improved, [name] protocol has been followed now in phase 3.  I have evaluated this patient multiple times today she is now stable enough to start eating p.o.  PAST MEDICAL HISTORY   Past Medical History:  Diagnosis Date  . Breast cancer ([place])   . 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 Diabetes mellitus ([place])   . Diabetic nephropathy ([place])   . ESRD on dialysis ([place])   . Hyperlipidemia   . Hypertension   . Nephrotic syndrome    diabetic nephropathy, biopsy on 01/06/16 at [facility]  . Normocytic anemia   . Seizures ([place])   . Subclinical hypothyroidism   . Tobacco use   . Vitamin D deficiency      SURGICAL HISTORY   Past Surgical History:  Procedure Laterality Date  . BREAST SURGERY    . IR THORACENTESIS ASP PLEURAL SPACE W/IMG GUIDE  01/23/2019  . PARTIAL HYSTERECTOMY       FAMILY HISTORY   Family History  Problem Relation Age of Onset  . Diabetes Other      SOCIAL HISTORY   Social History   Tobacco Use  . Smoking status: Current Every Day Smoker    Packs/day: 0.50    Types: Cigarettes  . Smokeless tobacco: Never Used  Substance Use Topics  . Alcohol use: No  . Drug use: No     MEDICATIONS   Current Medication:  Current Facility-Administered Medications:  .  0.9 %  sodium chloride infusion, , Intravenous, Continuous, Lance Coon, MD .  Derrill Memo ON 03/03/2019] Chlorhexidine Gluconate Cloth 2 % PADS 6 each, 6 each, Topical, Q0600, Candiss Norse, [place], MD .  dextrose 5 %-0.45 % sodium chloride infusion, , Intravenous, Continuous, Lance Coon, MD, Last Rate: 75 mL/hr at 03/02/19 1051 .  heparin injection 5,000 Units, 5,000 Units, Subcutaneous, Q8H, Lance Coon, MD .   insulin regular, human (MYXREDLIN) 100 units/ 100 mL infusion, , Intravenous, Continuous, Lance Coon, MD    ALLERGIES   Penicillins    REVIEW OF SYSTEMS    10 system ROS unable to perform due to mild confusion  PHYSICAL EXAMINATION   Vitals:   03/02/19 0930 03/02/19 1000  BP: (!) 145/78 (!) 147/73  Pulse: 85 83  Resp: 12 20  Temp:    SpO2: 100% 100%    GENERAL: No apparent distress HEAD: Normocephalic, atraumatic.  EYES: Pupils equal, round, reactive to light.  No scleral icterus.  MOUTH: Moist mucosal membrane. NECK: Supple. No thyromegaly. No nodules. No JVD.  PULMONARY: Clear to auscultation diminished breath sounds on right CARDIOVASCULAR: S1 and S2. Regular rate and rhythm. No murmurs, rubs, or gallops.  GASTROINTESTINAL: Soft, nontender, non-distended. No masses. Positive bowel sounds. No hepatosplenomegaly.  MUSCULOSKELETAL: No swelling, clubbing, or edema.  NEUROLOGIC: Mild distress due to acute illness SKIN:intact,warm,dry   LABS AND IMAGING    LAB RESULTS: Recent Labs  Lab 03/02/19 0030 03/02/19 1052  NA 125* 128*  K 4.9 3.2*  CL 90* 99  CO2 12* 17*  BUN 52* 54*  CREATININE 6.00* 6.19*  GLUCOSE 1,554* 904*   Recent Labs  Lab 03/02/19 0030 03/02/19 1052  HGB 10.8* 9.8*  HCT 43.3 32.6*  WBC 7.1 7.2  PLT 161 159     IMAGING RESULTS: Dg Chest 1 View  Result Date: 03/02/2019 CLINICAL DATA:  Witness seizure. EXAM: CHEST  1  VIEW COMPARISON:  January 23, 2019 FINDINGS: There is a moderate right-sided pleural effusion with underlying opacity which is stable. Left Port-A-Cath in the right double-lumen catheter are in stable position. Stable cardiomegaly. No overt edema. The left lung remains clear. No pneumothorax. IMPRESSION: Stable moderate right-sided pleural effusion with underlying opacity. Stable support apparatus. No acute interval change. Electronically Signed   By: Dorise Bullion III M.D   On: 03/02/2019 01:48   Ct Head Wo Contrast   Result Date: 03/02/2019 CLINICAL DATA:  New onset seizure EXAM: CT HEAD WITHOUT CONTRAST TECHNIQUE: Contiguous axial images were obtained from the base of the skull through the vertex without intravenous contrast. COMPARISON:  Head CT 12/18/2018 FINDINGS: Brain: There is no mass, hemorrhage or extra-axial collection. The size and configuration of the ventricles and extra-axial CSF spaces are normal. There is hypoattenuation of the white matter, most commonly indicating chronic small vessel disease. Small focus of mineralization in the central pons is unchanged. Vascular: No abnormal hyperdensity of the major intracranial arteries or dural venous sinuses. No intracranial atherosclerosis. Skull: The visualized skull base, calvarium and extracranial soft tissues are normal. Sinuses/Orbits: No fluid levels or advanced mucosal thickening of the visualized paranasal sinuses. No mastoid or middle ear effusion. The orbits are normal. IMPRESSION: 1. No acute intracranial abnormality. 2. Findings of chronic small vessel disease. Electronically Signed   By: Ulyses Jarred M.D.   On: 03/02/2019 02:46      ASSESSMENT AND PLAN      Acute on chronic right sided pleural effusion  Supplemental O2 for dyspnea and/or hypoxia  Repeat CXR in am   Hypertension  Continuous telemetry monitoring  Prn hydralazine for bp management   ESRD on HD Lactic acidosis-resolved  Trend BMP Replace electrolytes as indicated Avoid nephrotoxic medications  Nephrology consulted appreciate input-HD per recommendations   Diabetic Ketoacidosis  Continue insulin gtt until anion gap closed and serum CO2 >20 BMP q4hrs and CBG q1hr while on insulin gtt  Diabetes coordinator consulted appreciate input   Seizures Continue keppra  EEG pending  Neurology consulted appreciate input  Seizure precautions  GI/Nutrition GI PROPHYLAXIS as indicated DIET-->TF's as tolerated Constipation protocol as indicated  ENDO - ICU  hypoglycemic\Hyperglycemia protocol -check FSBS per protocol   ELECTROLYTES -follow labs as needed -replace as needed -pharmacy consultation   DVT/GI PRX ordered -SCDs  TRANSFUSIONS AS NEEDED MONITOR FSBS ASSESS the need for LABS as needed   Critical care provider statement:    Critical care time (minutes):  35   Critical care time was exclusive of:  Separately billable procedures and treating other patients   Critical care was necessary to treat or prevent imminent or life-threatening deterioration of the following conditions:   Diabetic ketoacidosis, seizure disorder, multiple electrolyte abnormalities, acute hypoxic respiratory failure   Critical care was time spent personally by me on the following activities:  Development of treatment plan with patient or surrogate, discussions with consultants, evaluation of patient's response to treatment, examination of patient, obtaining history from patient or surrogate, ordering and performing treatments and interventions, ordering and review of laboratory studies and re-evaluation of patient's condition.  I assumed direction of critical care for this patient from another provider in my specialty: no    This document was prepared using Dragon voice recognition software and may include unintentional dictation errors.    Ottie Glazier, M.D.  [facility]

## 2019-03-02 NOTE — Progress Notes (Signed)
This note also relates to the following rows which could not be included: Pulse Rate - Cannot attach notes to unvalidated device data Resp - Cannot attach notes to unvalidated device data BP - Cannot attach notes to unvalidated device data SpO2 - Cannot attach notes to unvalidated device data  HD start   03/02/19 1530  Vital Signs  Temp 98.1 F (36.7 C)  Temp Source Oral  Pulse Rate Source Monitor  BP Location Left Arm  BP Method Automatic  Patient Position (if appropriate) Lying  Oxygen Therapy  O2 Device Room Air  During Hemodialysis Assessment  Blood Flow Rate (mL/min) 400 mL/min  Arterial Pressure (mmHg) -140 mmHg  Venous Pressure (mmHg) 120 mmHg  Transmembrane Pressure (mmHg) 50 mmHg  Ultrafiltration Rate (mL/min) 0.43 mL/min  Dialysate Flow Rate (mL/min) 600 ml/min  Conductivity: Machine  14.1  HD Safety Checks Performed Yes  Dialysis Fluid Bolus Normal Saline  Bolus Amount (mL) 250 mL  Intra-Hemodialysis Comments Tx initiated (cvc\ c are per policy)

## 2019-03-02 NOTE — Progress Notes (Signed)
Pre-HD Pending hep status from [employer]. Spoke with Neoma Laming.   03/02/19 1515  Hand-Off documentation  Report received from (Full Name) Ricka Burdock   Vital Signs  Temp 98.1 F (36.7 C)  Temp Source Oral  Pulse Rate 77  Pulse Rate Source Monitor  Resp (!) 21  BP (!) 155/86  BP Location Left Arm  BP Method Automatic  Patient Position (if appropriate) Lying  Oxygen Therapy  SpO2 96 %  O2 Device Room Air  Pain Assessment  Pain Scale 0-10  Pain Score Asleep  Dialysis Weight  Weight 73 kg  Type of Weight Pre-Dialysis (padding on bed)  Time-Out for Hemodialysis  What Procedure? hd  Pt Identifiers(min of two) First/Last Name;MRN/Account#  Correct Site? Yes  Correct Side? Yes  Correct Procedure? Yes  Consents Verified? Yes  Rad Studies Available? N/A  Safety Precautions Reviewed? Yes  Engineer, civil (consulting) Number 1  Station Number  [id number])  UF/Alarm Test Passed  Conductivity: Meter 14  Conductivity: Machine  14  pH 7.3  Reverse Osmosis 3  Normal Saline Lot Number [id number]  Dialyzer Lot Number [id number]  Disposable Set Lot Number [id number]  Machine Temperature 98.6 F (37 C)  Musician and Audible Yes  Blood Lines Intact and Secured Yes  Pre Treatment Patient Checks  Vascular access used during treatment Catheter  Date Hepatitis B Surface Antigen Drawn 03/02/19  Isolation Initiated Yes  Hepatitis B Surface Antibody  (unkown )  Date Hepatitis B Surface Antibody Drawn  (pending from [facility])  Hemodialysis Consent Verified Yes  Hemodialysis Standing Orders Initiated Yes  ECG (Telemetry) Monitor On Yes  Prime Ordered Normal Saline  Length of  DialysisTreatment -hour(s) 3.5 Hour(s)  Dialyzer Elisio 17H NR  Dialysate 4K  Variable Sodium  (140)  Dialysis Anticoagulant None  Dialysate Flow Ordered 400  Blood Flow Rate Ordered 600 mL/min  Ultrafiltration Goal 0 Liters  Ultrafiltration Profile  (none )  Dialysis Blood Pressure Support Ordered  Normal Saline  Education / Care Plan  Dialysis Education Provided Yes  Documented Education in Care Plan Yes

## 2019-03-03 LAB — GLUCOSE, CAPILLARY
Glucose-Capillary: 309 mg/dL — ABNORMAL HIGH (ref 70–99)
Glucose-Capillary: 364 mg/dL — ABNORMAL HIGH (ref 70–99)
Glucose-Capillary: 212 mg/dL — ABNORMAL HIGH (ref 70–99)
Glucose-Capillary: 223 mg/dL — ABNORMAL HIGH (ref 70–99)

## 2019-03-03 LAB — CBC WITH DIFFERENTIAL/PLATELET
Abs Immature Granulocytes: 0.02 10*3/uL (ref 0.00–0.07)
Basophils Absolute: 0.1 10*3/uL (ref 0.0–0.1)
Basophils Relative: 1 %
Eosinophils Absolute: 0.3 10*3/uL (ref 0.0–0.5)
Eosinophils Relative: 3 %
HCT: 33.7 % — ABNORMAL LOW (ref 36.0–46.0)
Hemoglobin: 10.3 g/dL — ABNORMAL LOW (ref 12.0–15.0)
Immature Granulocytes: 0 %
Lymphocytes Relative: 11 %
Lymphs Abs: 0.9 10*3/uL (ref 0.7–4.0)
MCH: 26 pg (ref 26.0–34.0)
MCHC: 30.6 g/dL (ref 30.0–36.0)
MCV: 85.1 fL (ref 80.0–100.0)
Monocytes Absolute: 0.9 10*3/uL (ref 0.1–1.0)
Monocytes Relative: 11 %
Neutro Abs: 5.7 10*3/uL (ref 1.7–7.7)
Neutrophils Relative %: 74 %
Platelets: 153 10*3/uL (ref 150–400)
RBC: 3.96 MIL/uL (ref 3.87–5.11)
RDW: 15.9 % — ABNORMAL HIGH (ref 11.5–15.5)
WBC: 7.9 10*3/uL (ref 4.0–10.5)
nRBC: 0 % (ref 0.0–0.2)

## 2019-03-03 LAB — BASIC METABOLIC PANEL
Anion gap: 11 (ref 5–15)
BUN: 26 mg/dL — ABNORMAL HIGH (ref 6–20)
CO2: 23 mmol/L (ref 22–32)
Calcium: 6.6 mg/dL — ABNORMAL LOW (ref 8.9–10.3)
Chloride: 100 mmol/L (ref 98–111)
Creatinine, Ser: 3.68 mg/dL — ABNORMAL HIGH (ref 0.44–1.00)
GFR calc Af Amer: 16 mL/min — ABNORMAL LOW (ref >60)
GFR calc non Af Amer: 13 mL/min — ABNORMAL LOW (ref >60)
Glucose, Bld: 369 mg/dL — ABNORMAL HIGH (ref 70–99)
Potassium: 3.7 mmol/L (ref 3.5–5.1)
Sodium: 134 mmol/L — ABNORMAL LOW (ref 135–145)

## 2019-03-03 MED ORDER — GLUCOSE BLOOD VI STRP: ORAL_STRIP | 1 refills | Status: AC

## 2019-03-03 MED ORDER — HEPARIN SOD (PORK) LOCK FLUSH 100 UNIT/ML IV SOLN
500.0000 [IU] | Freq: Once | INTRAVENOUS | Status: AC
  Administered 2019-03-03: 500 [IU] via INTRAVENOUS
  Filled 2019-03-03: qty 5

## 2019-03-03 MED ORDER — SODIUM CHLORIDE 0.9% FLUSH: 3.0000 mL | INTRAVENOUS | Status: DC | PRN

## 2019-03-03 MED ORDER — LEVETIRACETAM 500 MG PO TABS: 1000.0000 mg | ORAL_TABLET | Freq: Two times a day (BID) | ORAL | 3 refills | Status: DC

## 2019-03-03 MED ORDER — INSULIN GLARGINE 100 UNIT/ML ~~LOC~~ SOLN: 10.0000 [IU] | Freq: Every day | SUBCUTANEOUS | 2 refills | Status: AC

## 2019-03-03 MED ORDER — INSULIN GLARGINE 100 UNIT/ML ~~LOC~~ SOLN
10.0000 [IU] | SUBCUTANEOUS | Status: DC
  Filled 2019-03-03: qty 0.1

## 2019-03-03 MED ORDER — CALCIUM ACETATE 667 MG PO CAPS: 1334.0000 mg | ORAL_CAPSULE | Freq: Three times a day (TID) | ORAL | 0 refills | Status: AC

## 2019-03-03 MED ORDER — SODIUM CHLORIDE 0.9% FLUSH
3.0000 mL | Freq: Two times a day (BID) | INTRAVENOUS | Status: DC
  Administered 2019-03-03: 3 mL via INTRAVENOUS

## 2019-03-03 MED ORDER — CALCITRIOL 0.25 MCG PO CAPS
0.5000 ug | ORAL_CAPSULE | Freq: Every day | ORAL | Status: DC
  Administered 2019-03-03: 0.5 ug via ORAL
  Filled 2019-03-03: qty 2

## 2019-03-03 MED ORDER — LEVETIRACETAM 1000 MG PO TABS: 1000.0000 mg | ORAL_TABLET | Freq: Every day | ORAL | 2 refills | Status: DC

## 2019-03-03 MED ORDER — CALCIUM ACETATE (PHOS BINDER) 667 MG PO CAPS
1334.0000 mg | ORAL_CAPSULE | Freq: Three times a day (TID) | ORAL | Status: DC
  Administered 2019-03-03: 12:00:00 1334 mg via ORAL
  Filled 2019-03-03 (×2): qty 2

## 2019-03-03 MED ORDER — LEVETIRACETAM 500 MG PO TABS
1000.0000 mg | ORAL_TABLET | Freq: Every day | ORAL | Status: DC
  Administered 2019-03-03: 1000 mg via ORAL
  Filled 2019-03-03: qty 2

## 2019-03-03 MED ORDER — INSULIN GLARGINE 100 UNIT/ML ~~LOC~~ SOLN
12.0000 [IU] | SUBCUTANEOUS | Status: DC
  Filled 2019-03-03: qty 0.12

## 2019-03-03 MED ORDER — LEVETIRACETAM 500 MG PO TABS: ORAL_TABLET | ORAL | 1 refills | Status: DC

## 2019-03-03 MED ORDER — SODIUM CHLORIDE 0.9% FLUSH
10.0000 mL | Freq: Two times a day (BID) | INTRAVENOUS | Status: DC
  Administered 2019-03-03: 08:00:00 10 mL via INTRAVENOUS

## 2019-03-03 MED ORDER — INSULIN ASPART PROT & ASPART (70-30 MIX) 100 UNIT/ML ~~LOC~~ SUSP: 10.0000 [IU] | Freq: Once | SUBCUTANEOUS | Status: DC

## 2019-03-03 MED ORDER — AMLODIPINE BESYLATE 5 MG PO TABS: 5.0000 mg | ORAL_TABLET | Freq: Every day | ORAL | Status: DC

## 2019-03-03 MED ORDER — TAMOXIFEN CITRATE 10 MG PO TABS
20.0000 mg | ORAL_TABLET | Freq: Every day | ORAL | Status: DC
  Administered 2019-03-03: 20 mg via ORAL
  Filled 2019-03-03: qty 2

## 2019-03-03 MED ORDER — INSULIN ASPART 100 UNIT/ML ~~LOC~~ SOLN
4.0000 [IU] | Freq: Three times a day (TID) | SUBCUTANEOUS | Status: DC
  Administered 2019-03-03: 4 [IU] via SUBCUTANEOUS
  Filled 2019-03-03: qty 1

## 2019-03-03 MED ORDER — ATORVASTATIN CALCIUM 20 MG PO TABS: 20.0000 mg | ORAL_TABLET | Freq: Every day | ORAL | Status: DC

## 2019-03-03 MED ORDER — INSULIN ASPART 100 UNIT/ML ~~LOC~~ SOLN: 4.0000 [IU] | Freq: Three times a day (TID) | SUBCUTANEOUS | 1 refills | Status: AC

## 2019-03-03 NOTE — Progress Notes (Signed)
CRITICAL CARE NOTE         SUBJECTIVE FINDINGS & SIGNIFICANT EVENTS   Patient clinically improved.  No overnight events.  [name] resolved, mental status improved.  Plan for downgrade to medical floor. Blood sugar this a.m.-369-giving 10 units of NovoLog 70/30 continue ISS.   PAST MEDICAL HISTORY   Past Medical History:  Diagnosis Date  . Breast cancer ([place])   . 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 Diabetes mellitus ([place])   . Diabetic nephropathy ([place])   . ESRD on dialysis ([place])   . Hyperlipidemia   . Hypertension   . Nephrotic syndrome    diabetic nephropathy, biopsy on 01/06/16 at [facility]  . Normocytic anemia   . Seizures ([place])   . Subclinical hypothyroidism   . Tobacco use   . Vitamin D deficiency      SURGICAL HISTORY   Past Surgical History:  Procedure Laterality Date  . BREAST SURGERY    . IR THORACENTESIS ASP PLEURAL SPACE W/IMG GUIDE  01/23/2019  . PARTIAL HYSTERECTOMY       FAMILY HISTORY   Family History  Problem Relation Age of Onset  . Diabetes Other      SOCIAL HISTORY   Social History   Tobacco Use  . Smoking status: Current Every Day Smoker    Packs/day: 0.50    Types: Cigarettes  . Smokeless tobacco: Never Used  Substance Use Topics  . Alcohol use: No  . Drug use: No     MEDICATIONS   Current Medication:  Current Facility-Administered Medications:  .  Chlorhexidine Gluconate Cloth 2 % PADS 6 each, 6 each, Topical, Q0600, Murlean Iba, MD, 6 each at 03/03/19 0508 .  heparin injection 5,000 Units, 5,000 Units, Subcutaneous, Q8H, Lance Coon, MD, 5,000 Units at 03/03/19 0507 .  insulin aspart (novoLOG) injection 0-5 Units, 0-5 Units, Subcutaneous, QHS, Blakeney, Dana G, NP .  insulin aspart (novoLOG) injection 0-9 Units, 0-9 Units,  Subcutaneous, TID WC, Blakeney, Dana G, NP .  insulin glargine (LANTUS) injection 10 Units, 10 Units, Subcutaneous, Q24H, Blakeney, Dana G, NP .  lisinopril (PRINIVIL,ZESTRIL) tablet 20 mg, 20 mg, Oral, Daily, Awilda Bill, NP, 20 mg at 03/02/19 2044 .  loperamide (IMODIUM) capsule 2 mg, 2 mg, Oral, PRN, Awilda Bill, NP, 2 mg at 03/02/19 2044    ALLERGIES   Penicillins    REVIEW OF SYSTEMS    10 system ROS is negative except as per subjective findings  PHYSICAL EXAMINATION   Vitals:   03/03/19 0600 03/03/19 0700  BP: (!) 163/88 (!) 165/83  Pulse: 86 90  Resp: 19 13  Temp:    SpO2: 91% 90%    GENERAL: No apparent distress HEAD: Normocephalic, atraumatic.  EYES: Pupils equal, round, reactive to light.  No scleral icterus.  MOUTH: Moist mucosal membrane. NECK: Supple. No thyromegaly. No nodules. No JVD.  PULMONARY: Clear to auscultation bilaterally CARDIOVASCULAR: S1 and S2. Regular rate and rhythm. No murmurs, rubs, or gallops.  GASTROINTESTINAL: Soft, nontender, non-distended. No masses. Positive bowel sounds. No hepatosplenomegaly.  MUSCULOSKELETAL: No swelling, clubbing, or edema.  NEUROLOGIC: Mild distress due to acute illness SKIN:intact,warm,dry   LABS AND IMAGING     LAB RESULTS: Recent Labs  Lab 03/02/19 1838 03/02/19 1932 03/03/19 0454  NA 138 136 134*  K 3.5 3.4* 3.7  CL 103 103 100  CO2 27 26 23   BUN 23* 23* 26*  CREATININE 2.68* 2.97* 3.68*  GLUCOSE 86 85  369*   Recent Labs  Lab 03/02/19 0030 03/02/19 1052 03/03/19 0454  HGB 10.8* 9.8* 10.3*  HCT 43.3 32.6* 33.7*  WBC 7.1 7.2 7.9  PLT 161 159 153     IMAGING RESULTS: No results found.    ASSESSMENT AND PLAN   Acute on chronic right sided pleural effusion  Supplemental O2 for dyspnea and/or hypoxia  Repeat CXR in am   Hypertension  Continuous telemetry monitoring  Prn hydralazine for bp management   ESRD on HD Lactic acidosis-resolved  Trend BMP Replace  electrolytes as indicated Avoid nephrotoxic medications  Nephrology consulted appreciate input-HD per recommendations   Diabetic Ketoacidosis -resolved Transition to subcu weight-based regimen Diabetes coordinator consulted appreciate input   Seizures Continue keppra  EEG pending  Neurology consulted appreciate input  Seizure precautions GI/Nutrition GI PROPHYLAXIS as indicated DIET-->TF's as tolerated Constipation protocol as indicated  ENDO - ICU hypoglycemic\Hyperglycemia protocol -check FSBS per protocol   ELECTROLYTES -follow labs as needed -replace as needed -pharmacy consultation   DVT/GI PRX ordered -SCDs  TRANSFUSIONS AS NEEDED MONITOR FSBS ASSESS the need for LABS as needed     This document was prepared using Dragon voice recognition software and may include unintentional dictation errors.    Ottie Glazier, M.D.  Division of [place]

## 2019-03-03 NOTE — Progress Notes (Signed)
Blood sugars improved/controlled. Tolerating diet. Agreeable that she is ready to go home. Oral and written AVS instructions given with stated understanding. Will deaccess port just prior to discharge.

## 2019-03-03 NOTE — Progress Notes (Signed)
[facility], [place] 03/03/19  Subjective:   Patient did well with HD Able to eat this morning without nausea or vomiting No SOB No Leg edema  Objective:  Vital signs in last 24 hours:  Temp:  [98 F (36.7 C)-99.2 F (37.3 C)] 98.9 F (37.2 C) (04/05 0349) Pulse Rate:  [77-91] 89 (04/05 0745) Resp:  [0-24] 18 (04/05 0745) BP: (145-177)/(73-109) 146/96 (04/05 0745) SpO2:  [90 %-100 %] 97 % (04/05 0745) Weight:  [73 kg] 73 kg (04/04 1515)  Weight change:  Filed Weights   03/02/19 1515  Weight: 73 kg    Intake/Output:    Intake/Output Summary (Last 24 hours) at 03/03/2019 0927 Last data filed at 03/03/2019 0500 Gross per 24 hour  Intake 69.07 ml  Output -100 ml  Net 169.07 ml     Physical Exam: General:  No acute distress, laying in the bed  HEENT  anicteric, moist oral mucous membranes  Neck supple  Pulm/lungs  normal breathing effort, clear to auscultation  CVS/Heart  regular rhythm, no rub  Abdomen:   Soft, nontender  Extremities:  No peripheral edema  Neurologic:  Alert, oriented  Skin:  No acute rashes  Access:  Right IJ PermCath       Basic Metabolic Panel:  Recent Labs  Lab 03/02/19 0030 03/02/19 1052 03/02/19 1438 03/02/19 1838 03/02/19 1932 03/03/19 0454  NA 125* 128* 131* 138 136 134*  K 4.9 3.2* 2.9* 3.5 3.4* 3.7  CL 90* 99 100 103 103 100  CO2 12* 17* 18* 27 26 23   GLUCOSE 1,554* 904* 494* 86 85 369*  BUN 52* 54* 53* 23* 23* 26*  CREATININE 6.00* 6.19* 6.21* 2.68* 2.97* 3.68*  CALCIUM 6.8* 6.5* 6.8* 7.0* 6.7* 6.6*  MG 2.3  --   --   --   --   --   PHOS  --   --   --   --  2.5  --      CBC: Recent Labs  Lab 03/02/19 0030 03/02/19 1052 03/03/19 0454  WBC 7.1 7.2 7.9  NEUTROABS 5.7  --  5.7  HGB 10.8* 9.8* 10.3*  HCT 43.3 32.6* 33.7*  MCV 104.1* 87.2 85.1  PLT 161 159 153      Lab Results  Component Value Date   HEPBSAG Negative 01/22/2019      Microbiology:  Recent Results (from the  past 240 hour(s))  MRSA PCR Screening     Status: None   Collection Time: 03/02/19 10:53 AM  Result Value Ref Range Status   MRSA by PCR NEGATIVE NEGATIVE Final    Comment:        The GeneXpert MRSA Assay (FDA approved for NASAL specimens only), is one component of a comprehensive MRSA colonization surveillance program. It is not intended to diagnose MRSA infection nor to guide or monitor treatment for MRSA infections. Performed at [facility], [address] [place], [place] [address]     Coagulation Studies: Recent Labs    03/02/19 0030  LABPROT 13.5  INR 1.0    Urinalysis: No results for input(s): COLORURINE, LABSPEC, PHURINE, GLUCOSEU, HGBUR, BILIRUBINUR, KETONESUR, PROTEINUR, UROBILINOGEN, NITRITE, LEUKOCYTESUR in the last 72 hours.  Invalid input(s): APPERANCEUR    Imaging: Dg Chest 1 View  Result Date: 03/02/2019 CLINICAL DATA:  Witness seizure. EXAM: CHEST  1 VIEW COMPARISON:  January 23, 2019 FINDINGS: There is a moderate right-sided pleural effusion with underlying opacity which is stable. Left Port-A-Cath in the right  double-lumen catheter are in stable position. Stable cardiomegaly. No overt edema. The left lung remains clear. No pneumothorax. IMPRESSION: Stable moderate right-sided pleural effusion with underlying opacity. Stable support apparatus. No acute interval change. Electronically Signed   By: Dorise Bullion III M.D   On: 03/02/2019 01:48   Ct Head Wo Contrast  Result Date: 03/02/2019 CLINICAL DATA:  New onset seizure EXAM: CT HEAD WITHOUT CONTRAST TECHNIQUE: Contiguous axial images were obtained from the base of the skull through the vertex without intravenous contrast. COMPARISON:  Head CT 12/18/2018 FINDINGS: Brain: There is no mass, hemorrhage or extra-axial collection. The size and configuration of the ventricles and extra-axial CSF spaces are normal. There is hypoattenuation of the white matter, most commonly indicating chronic small  vessel disease. Small focus of mineralization in the central pons is unchanged. Vascular: No abnormal hyperdensity of the major intracranial arteries or dural venous sinuses. No intracranial atherosclerosis. Skull: The visualized skull base, calvarium and extracranial soft tissues are normal. Sinuses/Orbits: No fluid levels or advanced mucosal thickening of the visualized paranasal sinuses. No mastoid or middle ear effusion. The orbits are normal. IMPRESSION: 1. No acute intracranial abnormality. 2. Findings of chronic small vessel disease. Electronically Signed   By: Ulyses Jarred M.D.   On: 03/02/2019 02:46     Medications:    . atorvastatin  20 mg Oral q1800  . calcitRIOL  0.5 mcg Oral Daily  . calcium acetate  1,334 mg Oral TID WC  . Chlorhexidine Gluconate Cloth  6 each Topical Q0600  . heparin  5,000 Units Subcutaneous Q8H  . insulin aspart  0-5 Units Subcutaneous QHS  . insulin aspart  0-9 Units Subcutaneous TID WC  . insulin aspart  4 Units Subcutaneous TID WC  . insulin glargine  12 Units Subcutaneous Q24H  . levETIRAcetam  1,000 mg Oral Daily  . lisinopril  20 mg Oral Daily  . sodium chloride flush  10 mL Intravenous Q12H  . sodium chloride flush  3 mL Intravenous Q12H  . tamoxifen  20 mg Oral Daily     Assessment/ Plan:  [name] with a type 1 diabetes, history of breast cancer last year, treated with lumpectomy, chemo, now on tamoxifen, end-stage renal disease, started dialysis in November 2019, admitted with [name] and seizures.  Recent admission in [place] for acute hypoxic respiratory failure, right pleural effusion, underwent thoracentesis 1.7 L on February 26  [facility]/TTS/right IJ PermCath  1.  End-stage renal disease -patient is expected to be discharged today.  -continue outpatient routine HD  2.  Anemia of chronic kidney disease -Avoid Epogen due to recent history of malignancy  3.  Hypokalemia -4K bath with  dialysis  4.  [name]/Type 1 DM with CKD - improved  6.  Secondary hyperparathyroidism -Continue home dose of binders with meals -monitor Phos  7. Seizures - As per IM team Currently on keppra   LOS: [place] 4/5/20209:27 [place], [place]  Note: This note was prepared with Dragon dictation. Any transcription errors are unintentional

## 2019-03-03 NOTE — Discharge Summary (Addendum)
[facility] NAME: Alyssa Floyd    MR#:  [id number]  DATE OF BIRTH:  [birth year]  DATE OF ADMISSION:  03/02/2019 ADMITTING PHYSICIAN: Lance Coon, MD  DATE OF DISCHARGE: 03/03/2019  PRIMARY CARE PHYSICIAN: Hemming, Claudette Laws, MD    ADMISSION DIAGNOSIS:  Seizure ([place]) [R56.9] ESRD (end stage renal disease) on dialysis ([place]) [N18.6, Z99.2] Elevated lactic acid level [R79.89] Diabetic ketoacidosis with coma associated with other specified diabetes mellitus ([place]) [E13.11] Chronic pleural effusion [J90]  DISCHARGE DIAGNOSIS:  [name]--resolved H/o Seizure DO ESRD On HD  SECONDARY DIAGNOSIS:   Past Medical History:  Diagnosis Date  . Breast cancer ([place])   . 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 Diabetes mellitus ([place])   . Diabetic nephropathy ([place])   . ESRD on dialysis ([place])   . Hyperlipidemia   . Hypertension   . Nephrotic syndrome    diabetic nephropathy, [facility] on 01/06/16 at [facility]  . Normocytic anemia   . Seizures ([place])   . Subclinical hypothyroidism   . Tobacco use   . Vitamin D deficiency     HOSPITAL COURSE:   Alyssa Floyd is a 53 y.o. female with medical history as listed below with extensive chronic medical history that includes what is apparently poorly controlled diabetes with prior episodes of [name], end-stage renal disease on hemodialysis Tuesdays, Thursdays, and Saturdays, and a history of breast cancer with metastases to the lymph nodes for which she is apparently undergoing chemotherapy.    She presents by EMS for evaluation of seizures  * [name] (diabetic ketoacidoses) ([place])  -now resolved -cont lantus and Novolog home dosing  *  Seizure disorder ([place]) -IV Keppra given, continue home dose antiepileptics -no seizures in the hospital  *  ESRD (end stage renal disease) on dialysis [facility]) -nephrology consult for dialysis support. Pt got dialyzed yday. She will resume her  outpatient dialysis schedule after discharge  *  Hyperlipidemia -home dose antilipid  *  Breast cancer metastasized to axillary lymph node, right ([place]) -home dose tamoxifen  Overall better. Will discharge her to home. Patient voiced understanding. CONSULTS OBTAINED:  Treatment Team:  Murlean Iba, MD  DRUG ALLERGIES:   Allergies  Allergen Reactions  . Penicillins Other (See Comments)    Pt reports hallucinations when taking penicillins. >Has patient had a PCN reaction causing immediate rash, facial/tongue/throat swelling, SOB or lightheadedness with hypotension: No Has patient had a PCN reaction causing severe rash involving mucus membranes or skin necrosis: No Has patient had a PCN reaction that required hospitalization No Has patient had a PCN reaction occurring within the last 10 years: No If all of the above answers are "NO", then may proceed with Cephalosporin use.    DISCHARGE MEDICATIONS:   Allergies as of 03/03/2019      Reactions   Penicillins Other (See Comments)   Pt reports hallucinations when taking penicillins. >Has patient had a PCN reaction causing immediate rash, facial/tongue/throat swelling, SOB or lightheadedness with hypotension: No Has patient had a PCN reaction causing severe rash involving mucus membranes or skin necrosis: No Has patient had a PCN reaction that required hospitalization No Has patient had a PCN reaction occurring within the last 10 years: No If all of the above answers are "NO", then may proceed with Cephalosporin use.      Medication List    STOP taking these medications   benzonatate 200 MG capsule Commonly known as:  TESSALON  TAKE these medications   atorvastatin 20 MG tablet Commonly known as:  LIPITOR Take 1 tablet by mouth daily.   calcitRIOL 0.5 MCG capsule Commonly known as:  ROCALTROL Take 1 capsule by mouth daily.   calcium acetate 667 MG capsule Commonly known as:  PHOSLO Take 2 capsules (1,334 mg total) by  mouth 3 (three) times daily with meals.   ferrous sulfate 325 (65 FE) MG EC tablet Take 1 tablet (325 mg total) by mouth daily.   glucose blood test strip Commonly known as:  FREESTYLE TEST STRIPS Use as directed   insulin aspart 100 UNIT/ML injection Commonly known as:  novoLOG Inject 4 Units into the skin 3 (three) times daily with meals. Please take fingersticks reading more than 200   insulin glargine 100 UNIT/ML injection Commonly known as:  LANTUS Inject 0.1 mLs (10 Units total) into the skin daily.   levETIRAcetam 500 MG tablet Commonly known as:  KEPPRA Take 1,500 mg by mouth daily. Tues. Thur and Saturday after HD What changed:  Another medication with the same name was removed. Continue taking this medication, and follow the directions you see here.   levETIRAcetam 500 MG tablet Commonly known as:  KEPPRA Take 2 tablets (1,000 mg total) by mouth 2 (two) times daily. On Mon, Wed, Fri What changed:  Another medication with the same name was removed. Continue taking this medication, and follow the directions you see here.   lisinopril 20 MG tablet Commonly known as:  PRINIVIL,ZESTRIL Take 20 mg by mouth daily.   tamoxifen 20 MG tablet Commonly known as:  NOLVADEX Take 20 mg by mouth daily.       If you experience worsening of your admission symptoms, develop shortness of breath, life threatening emergency, suicidal or homicidal thoughts you must seek medical attention immediately by calling 911 or calling your MD immediately  if symptoms less severe.  You Must read complete instructions/literature along with all the possible adverse reactions/side effects for all the Medicines you take and that have been prescribed to you. Take any new Medicines after you have completely understood and accept all the possible adverse reactions/side effects.   Please note  You were cared for by a hospitalist during your hospital stay. If you have any questions about your discharge  medications or the care you received while you were in the hospital after you are discharged, you can call the unit and asked to speak with the hospitalist on call if the hospitalist that took care of you is not available. Once you are discharged, your primary care physician will handle any further medical issues. Please note that NO REFILLS for any discharge medications will be authorized once you are discharged, as it is imperative that you return to your primary care physician (or establish a relationship with a primary care physician if you do not have one) for your aftercare needs so that they can reassess your need for medications and monitor your lab values. Today   SUBJECTIVE   Doing well. No new complaints. Had dialysis yesterday.  VITAL SIGNS:  Blood pressure (!) 146/96, pulse 89, temperature 98.9 F (37.2 C), temperature source Axillary, resp. rate 18, height 5\' 5"  (1.651 m), weight 72.8 kg, SpO2 97 %.  I/O:    Intake/Output Summary (Last 24 hours) at 03/03/2019 1219 Last data filed at 03/03/2019 0500 Gross per 24 hour  Intake 69.07 ml  Output -100 ml  Net 169.07 ml    PHYSICAL EXAMINATION:  GENERAL:  53 y.o.-year-old  patient lying in the bed with no acute distress.  EYES: Pupils equal, round, reactive to light and accommodation. No scleral icterus. Extraocular muscles intact.  HEENT: Head atraumatic, normocephalic. Oropharynx and nasopharynx clear.  NECK:  Supple, no jugular venous distention. No thyroid enlargement, no tenderness.  LUNGS: Normal breath sounds bilaterally, no wheezing, rales,rhonchi or crepitation. No use of accessory muscles of respiration.  CARDIOVASCULAR: S1, S2 normal. No murmurs, rubs, or gallops.  ABDOMEN: Soft, non-tender, non-distended. Bowel sounds present. No organomegaly or mass.  EXTREMITIES: No pedal edema, cyanosis, or clubbing.  NEUROLOGIC: Cranial nerves II through XII are intact. Muscle strength 5/5 in all extremities. Sensation intact. Gait  not checked.  PSYCHIATRIC: The patient is alert and oriented x 3.  SKIN: No obvious rash, lesion, or ulcer.   DATA REVIEW:   CBC  Recent Labs  Lab 03/03/19 0454  WBC 7.9  HGB 10.3*  HCT 33.7*  PLT 153    Chemistries  Recent Labs  Lab 03/02/19 0030  03/03/19 0454  NA 125*   < > 134*  K 4.9   < > 3.7  CL 90*   < > 100  CO2 12*   < > 23  GLUCOSE 1,554*   < > 369*  BUN 52*   < > 26*  CREATININE 6.00*   < > 3.68*  CALCIUM 6.8*   < > 6.6*  MG 2.3  --   --   AST 26  --   --   ALT 19  --   --   ALKPHOS 131*  --   --   BILITOT 0.6  --   --    < > = values in this interval not displayed.    Microbiology Results   Recent Results (from the past 240 hour(s))  MRSA PCR Screening     Status: None   Collection Time: 03/02/19 10:53 AM  Result Value Ref Range Status   MRSA by PCR NEGATIVE NEGATIVE Final    Comment:        The GeneXpert MRSA Assay (FDA approved for NASAL specimens only), is one component of a comprehensive MRSA colonization surveillance program. It is not intended to diagnose MRSA infection nor to guide or monitor treatment for MRSA infections. Performed at [facility], [address], [place],  [address]     RADIOLOGY:  Dg Chest 1 View  Result Date: 03/02/2019 CLINICAL DATA:  Witness seizure. EXAM: CHEST  1 VIEW COMPARISON:  January 23, 2019 FINDINGS: There is a moderate right-sided pleural effusion with underlying opacity which is stable. Left Port-A-Cath in the right double-lumen catheter are in stable position. Stable cardiomegaly. No overt edema. The left lung remains clear. No pneumothorax. IMPRESSION: Stable moderate right-sided pleural effusion with underlying opacity. Stable support apparatus. No acute interval change. Electronically Signed   By: Dorise Bullion III M.D   On: 03/02/2019 01:48   Ct Head Wo Contrast  Result Date: 03/02/2019 CLINICAL DATA:  New onset seizure EXAM: CT HEAD WITHOUT CONTRAST TECHNIQUE: Contiguous  axial images were obtained from the base of the skull through the vertex without intravenous contrast. COMPARISON:  Head CT 12/18/2018 FINDINGS: Brain: There is no mass, hemorrhage or extra-axial collection. The size and configuration of the ventricles and extra-axial CSF spaces are normal. There is hypoattenuation of the white matter, most commonly indicating chronic small vessel disease. Small focus of mineralization in the central pons is unchanged. Vascular: No abnormal hyperdensity of the major intracranial arteries or dural venous  sinuses. No intracranial atherosclerosis. Skull: The visualized skull base, calvarium and extracranial soft tissues are normal. Sinuses/Orbits: No fluid levels or advanced mucosal thickening of the visualized paranasal sinuses. No mastoid or middle ear effusion. The orbits are normal. IMPRESSION: 1. No acute intracranial abnormality. 2. Findings of chronic small vessel disease. Electronically Signed   By: Ulyses Jarred M.D.   On: 03/02/2019 02:46     CODE STATUS:     Code Status Orders  (From admission, onward)         Start     Ordered   03/02/19 1038  Full code  Continuous     03/02/19 1037        Code Status History    Date Active Date Inactive Code Status Order ID Comments User Context   01/22/2019 0022 01/26/2019 2209 Full Code [id number]  Shela Leff, MD Inpatient   12/18/2018 0135 12/20/2018 2017 Full Code [id number]  Sedalia Muta, MD ED   08/14/2018 1907 08/16/2018 2155 Full Code [id number]  Demetrios Loll, MD ED   08/11/2018 1355 08/14/2018 0044 Full Code [id number]  Loletha Grayer, MD ED   07/08/2018 0437 07/11/2018 2132 Full Code [id number]  Omar Person, NP ED   01/29/2016 1316 02/01/2016 1530 Full Code [id number]  Juluis Mire, MD ED      TOTAL TIME TAKING CARE OF THIS PATIENT: 40 minutes.    Fritzi Mandes M.D on 03/03/2019 at 12:19 PM  Between 7am to 6pm - Pager - [phone] After 6pm go to [email or web address] - password [email or web address] [facility]  Office  [phone]  CC: Primary care physician; Hemming, Claudette Laws, MD

## 2019-03-03 NOTE — Progress Notes (Signed)
Discharged home to self care with pt transported in transport chair to private vehicle with pt picked up by family.

## 2019-03-03 NOTE — Discharge Instructions (Signed)
Blood Glucose Monitoring, Adult Monitoring your blood sugar (glucose) is an important part of managing your diabetes (diabetes mellitus). Blood glucose monitoring involves checking your blood glucose as often as directed and keeping a record (log) of your results over time. Checking your blood glucose regularly and keeping a blood glucose log can:  Help you and your health care provider adjust your diabetes management plan as needed, including your medicines or insulin.  Help you understand how food, exercise, illnesses, and medicines affect your blood glucose.  Let you know what your blood glucose is at any time. You can quickly find out if you have low blood glucose (hypoglycemia) or high blood glucose (hyperglycemia). Your health care provider will set individualized treatment goals for you. Your goals will be based on your age, other medical conditions you have, and how you respond to diabetes treatment. Generally, the goal of treatment is to maintain the following blood glucose levels:  Before meals (preprandial): 80-130 mg/dL (4.4-7.2 mmol/L).  After meals (postprandial): below 180 mg/dL (10 mmol/L).  A1c level: less than 7%. Supplies needed:  Blood glucose meter.  Test strips for your meter. Each meter has its own strips. You must use the strips that came with your meter.  A needle to prick your finger (lancet). Do not use a lancet more than one time.  A device that holds the lancet (lancing device).  A journal or log book to write down your results. How to check your blood glucose  1. Wash your hands with soap and water. 2. Prick the side of your finger (not the tip) with the lancet. Use a different finger each time. 3. Gently rub the finger until a small drop of blood appears. 4. Follow instructions that come with your meter for inserting the test strip, applying blood to the strip, and using your blood glucose meter. 5. Write down your result and any notes. Some meters  allow you to use areas of your body other than your finger (alternative sites) to test your blood. The most common alternative sites are:  Forearm.  Thigh.  Palm of the hand. If you think you may have hypoglycemia, or if you have a history of not knowing when your blood glucose is getting low (hypoglycemia unawareness), do not use alternative sites. Use your finger instead. Alternative sites may not be as accurate as the fingers, because blood flow is slower in these areas. This means that the result you get may be delayed, and it may be different from the result that you would get from your finger. Follow these instructions at home: Blood glucose log   Every time you check your blood glucose, write down your result. Also write down any notes about things that may be affecting your blood glucose, such as your diet and exercise for the day. This information can help you and your health care provider: ? Look for patterns in your blood glucose over time. ? Adjust your diabetes management plan as needed.  Check if your meter allows you to download your records to a computer. Most glucose meters store a record of glucose readings in the meter. If you have type 1 diabetes:  Check your blood glucose 2 or more times a day.  Also check your blood glucose: ? Before every insulin injection. ? Before and after exercise. ? Before meals. ? 2 hours after a meal. ? Occasionally between 2:00 a.m. and 3:00 a.m., as directed. ? Before potentially dangerous tasks, like driving or using heavy  machinery. ? At bedtime.  You may need to check your blood glucose more often, up to 6-10 times a day, if you: ? Use an insulin pump. ? Need multiple daily injections (MDI). ? Have diabetes that is not well-controlled. ? Are ill. ? Have a history of severe hypoglycemia. ? Have hypoglycemia unawareness. If you have type 2 diabetes:  If you take insulin or other diabetes medicines, check your blood glucose 2 or  more times a day.  If you are on intensive insulin therapy, check your blood glucose 4 or more times a day. Occasionally, you may also need to check between 2:00 a.m. and 3:00 a.m., as directed.  Also check your blood glucose: ? Before and after exercise. ? Before potentially dangerous tasks, like driving or using heavy machinery.  You may need to check your blood glucose more often if: ? Your medicine is being adjusted. ? Your diabetes is not well-controlled. ? You are ill. General tips  Always keep your supplies with you.  If you have questions or need help, all blood glucose meters have a 24-hour "hotline" phone number that you can call. You may also contact your health care provider.  After you use a few boxes of test strips, adjust (calibrate) your blood glucose meter by following instructions that came with your meter. Contact a health care provider if:  Your blood glucose is at or above 240 mg/dL (13.3 mmol/L) for 2 days in a row.  You have been sick or have had a fever for 2 days or longer, and you are not getting better.  You have any of the following problems for more than 6 hours: ? You cannot eat or drink. ? You have nausea or vomiting. ? You have diarrhea. Get help right away if:  Your blood glucose is lower than 54 mg/dL (3 mmol/L).  You become confused or you have trouble thinking clearly.  You have difficulty breathing.  You have moderate or large ketone levels in your urine. Summary  Monitoring your blood sugar (glucose) is an important part of managing your diabetes (diabetes mellitus).  Blood glucose monitoring involves checking your blood glucose as often as directed and keeping a record (log) of your results over time.  Your health care provider will set individualized treatment goals for you. Your goals will be based on your age, other medical conditions you have, and how you respond to diabetes treatment.  Every time you check your blood glucose,  write down your result. Also write down any notes about things that may be affecting your blood glucose, such as your diet and exercise for the day. This information is not intended to replace advice given to you by your health care provider. Make sure you discuss any questions you have with your health care provider. Document Released: 11/17/2003 Document Revised: 09/25/2017 Document Reviewed: 04/25/2016 Elsevier Interactive Patient Education  2019 [employer] your HD as before

## 2019-03-03 NOTE — Progress Notes (Signed)
Pt A&O X4. All vital signs stable. Transferred by NT.

## 2019-03-04 LAB — GLUCOSE, CAPILLARY: Glucose-Capillary: 194 mg/dL — ABNORMAL HIGH (ref 70–99)

## 2019-03-04 LAB — HEPATITIS B SURFACE ANTIGEN: Hepatitis B Surface Ag: NEGATIVE

## 2019-03-12 ENCOUNTER — Ambulatory Visit: Admitting: *Deleted

## 2019-03-17 LAB — BLOOD GAS, ARTERIAL
Acid-base deficit: 16.6 mmol/L — ABNORMAL HIGH (ref 0.0–2.0)
Bicarbonate: 11.1 mmol/L — ABNORMAL LOW (ref 20.0–28.0)
FIO2: 100
O2 Saturation: 98.3 %
Patient temperature: 37
pCO2 arterial: 32 mmHg (ref 32.0–48.0)
pH, Arterial: 7.15 — CL (ref 7.350–7.450)
pO2, Arterial: 137 mmHg — ABNORMAL HIGH (ref 83.0–108.0)

## 2019-04-01 DIAGNOSIS — E119 Type 2 diabetes mellitus without complications: Secondary | ICD-10-CM | POA: Insufficient documentation

## 2019-04-01 DIAGNOSIS — Z794 Long term (current) use of insulin: Secondary | ICD-10-CM | POA: Insufficient documentation

## 2019-04-02 DIAGNOSIS — Z008 Encounter for other general examination: Secondary | ICD-10-CM | POA: Insufficient documentation

## 2019-04-08 DIAGNOSIS — R778 Other specified abnormalities of plasma proteins: Secondary | ICD-10-CM | POA: Insufficient documentation

## 2019-04-08 DIAGNOSIS — E871 Hypo-osmolality and hyponatremia: Secondary | ICD-10-CM | POA: Insufficient documentation

## 2019-05-09 ENCOUNTER — Ambulatory Visit (INDEPENDENT_AMBULATORY_CARE_PROVIDER_SITE_OTHER): Payer: Medicaid Other | Admitting: Podiatry

## 2019-05-09 ENCOUNTER — Encounter: Payer: Self-pay | Admitting: Podiatry

## 2019-05-09 ENCOUNTER — Other Ambulatory Visit: Payer: Self-pay

## 2019-05-09 DIAGNOSIS — E119 Type 2 diabetes mellitus without complications: Secondary | ICD-10-CM

## 2019-05-09 DIAGNOSIS — M79675 Pain in left toe(s): Secondary | ICD-10-CM

## 2019-05-09 DIAGNOSIS — Z794 Long term (current) use of insulin: Secondary | ICD-10-CM

## 2019-05-09 DIAGNOSIS — M79674 Pain in right toe(s): Secondary | ICD-10-CM

## 2019-05-09 DIAGNOSIS — B351 Tinea unguium: Secondary | ICD-10-CM | POA: Insufficient documentation

## 2019-05-09 NOTE — Progress Notes (Signed)
Complaint:  Visit Type: Patient returns to my office for continued preventative foot care services. Complaint: Patient states" my nails have grown long and thick and become painful to walk and wear shoes" Patient has been diagnosed with DM with no foot complications. The patient presents for preventative foot care services. No changes to ROS  Podiatric Exam: Vascular: dorsalis pedis and posterior tibial pulses are palpable bilateral. Capillary return is immediate. Temperature gradient is WNL. Skin turgor WNL  Sensorium: Normal Semmes Weinstein monofilament test. Normal tactile sensation bilaterally. Nail Exam: Pt has thick disfigured discolored nails with subungual debris noted bilateral entire nail hallux through fifth toenails Ulcer Exam: There is no evidence of ulcer or pre-ulcerative changes or infection. Orthopedic Exam: Muscle tone and strength are WNL. No limitations in general ROM. No crepitus or effusions noted. Foot type and digits show no abnormalities. Bony prominences are unremarkable. Skin: No Porokeratosis. No infection or ulcers  Diagnosis:  Onychomycosis, , Pain in right toe, pain in left toes  Treatment & Plan Procedures and Treatment: Consent by patient was obtained for treatment procedures.   Debridement of mycotic and hypertrophic toenails, 1 through 5 bilateral and clearing of subungual debris. No ulceration, no infection noted.  Return Visit-Office Procedure: Patient instructed to return to the office for a follow up visit 9 weeks for continued evaluation and treatment.    Gardiner Barefoot DPM

## 2019-07-18 ENCOUNTER — Ambulatory Visit: Payer: Medicaid Other | Admitting: Podiatry

## 2020-03-29 IMAGING — CT CT HEAD W/O CM
3 of 4 series · 15 of 47 positions shown, 18 images · non-contrast
Comparison: 01/29/2016

CLINICAL DATA: Recent seizure activity, history of breast carcinoma

EXAM:
CT HEAD WITHOUT CONTRAST
TECHNIQUE: Contiguous axial images were obtained from the base of the skull
through the vertex without intravenous contrast.

[Series 2: head wo · axial · 0.47mm/px · z∈[-144,-24]mm · 9 of 28 slices shown, 12 images]
[im 2/28  brain]
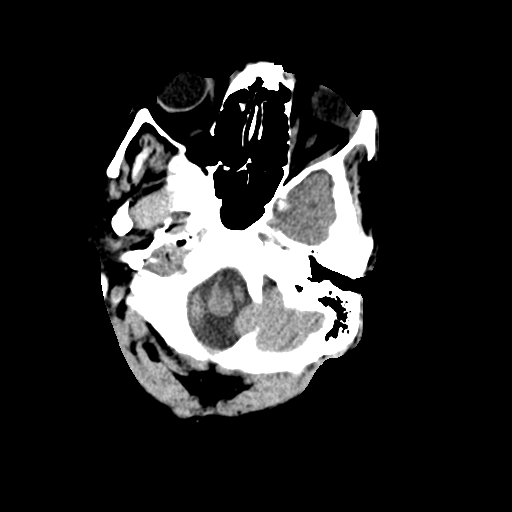
[im 2/28  bone]
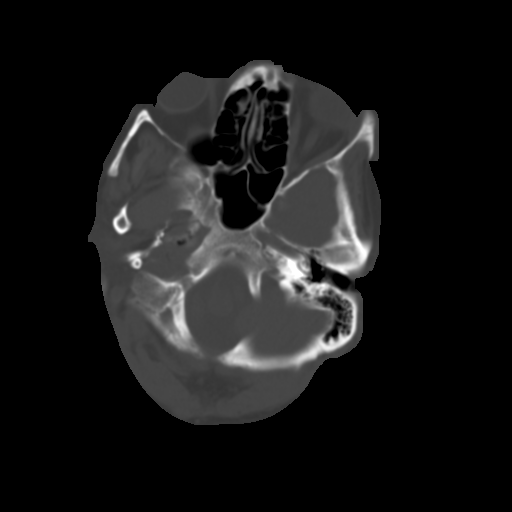
[im 6/28  brain]
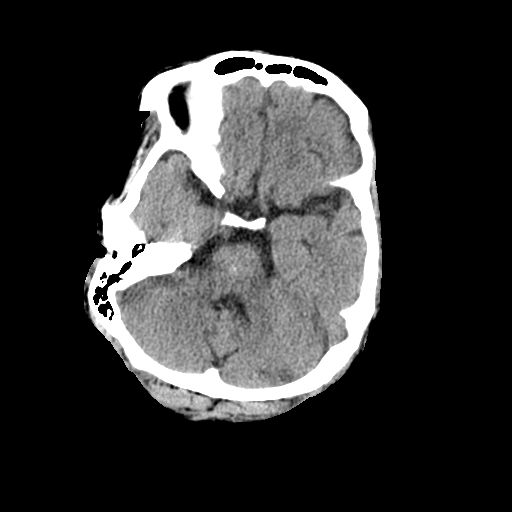
[im 8/28  brain]
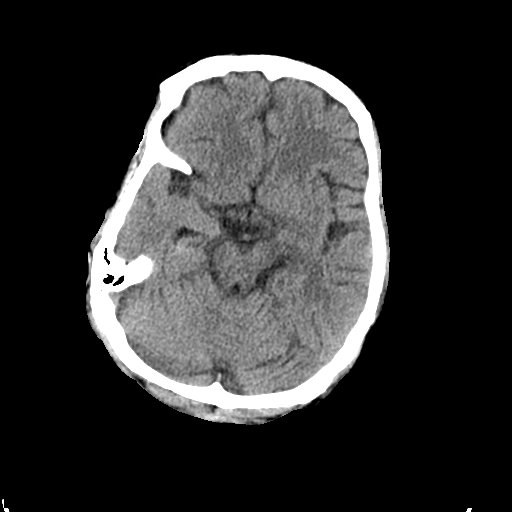
[im 12/28  brain]
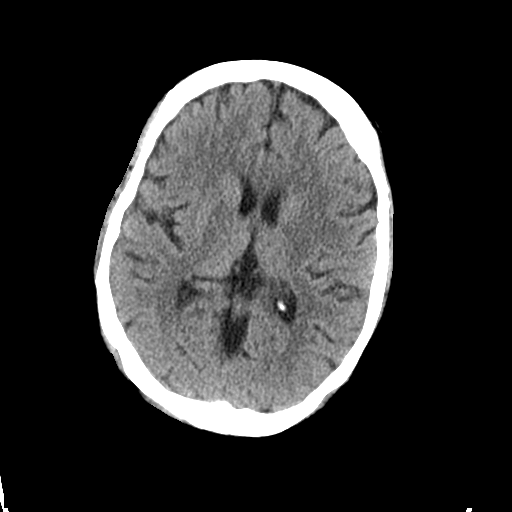
[im 14/28  brain]
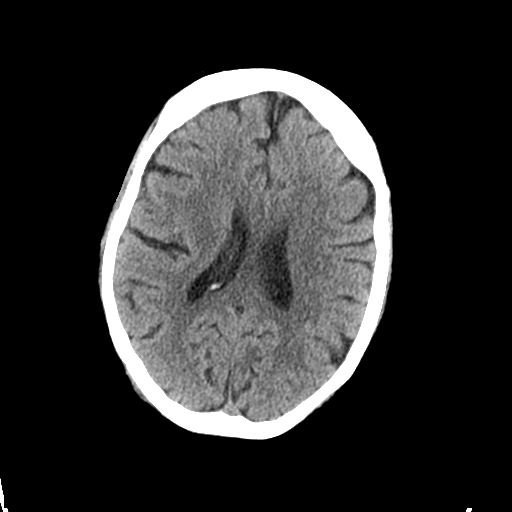
[im 14/28  bone]
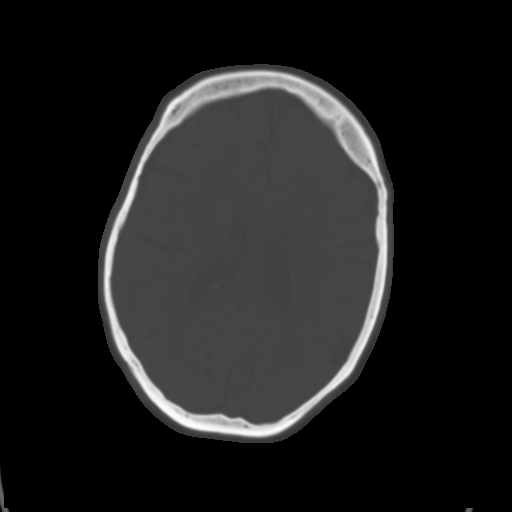
[im 16/28  brain]
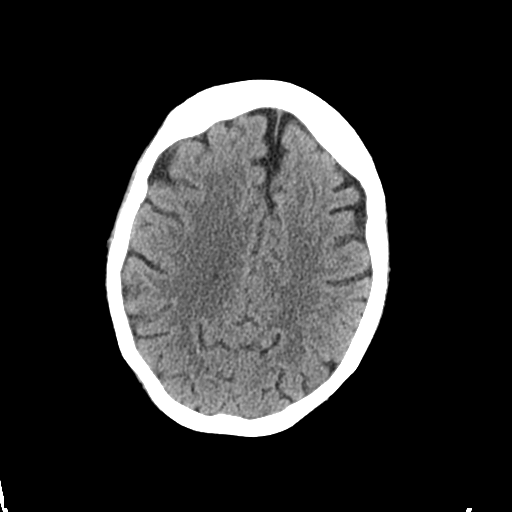
[im 20/28  brain]
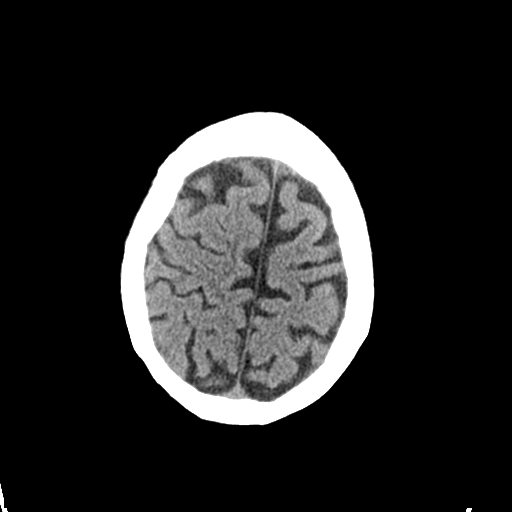
[im 22/28  brain]
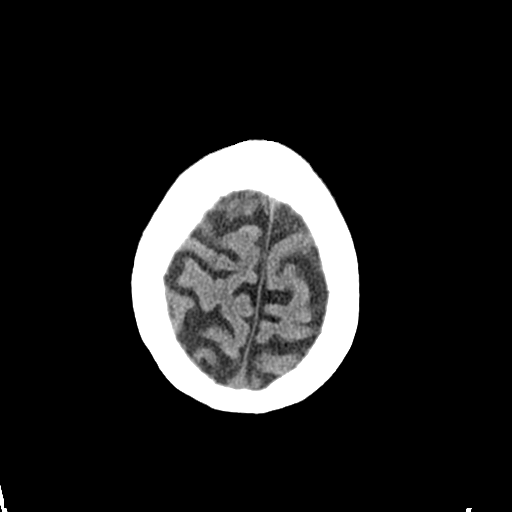
[im 26/28  brain]
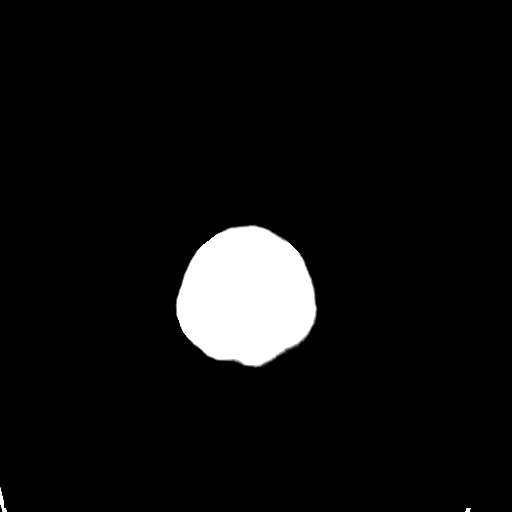
[im 26/28  bone]
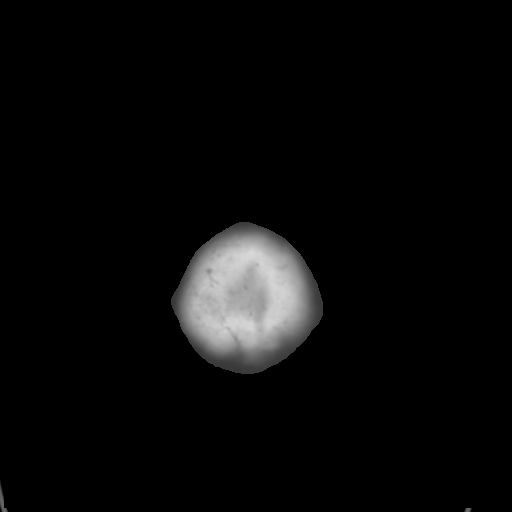

[Series 4: coronal soft tissue · coronal · 0.29mm/px · 3 of 64 slices shown]
[im 22/64  brain]
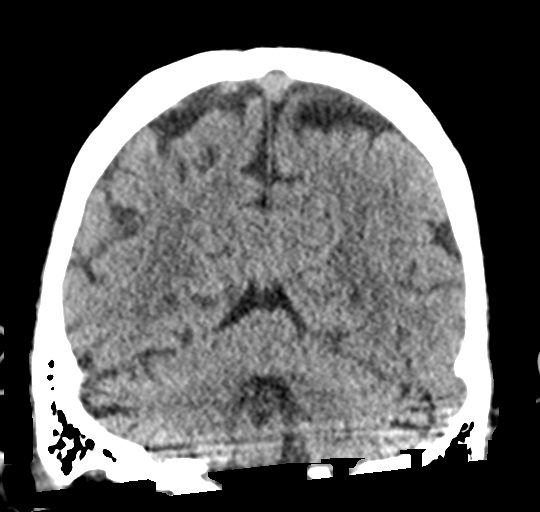
[im 29/64  brain]
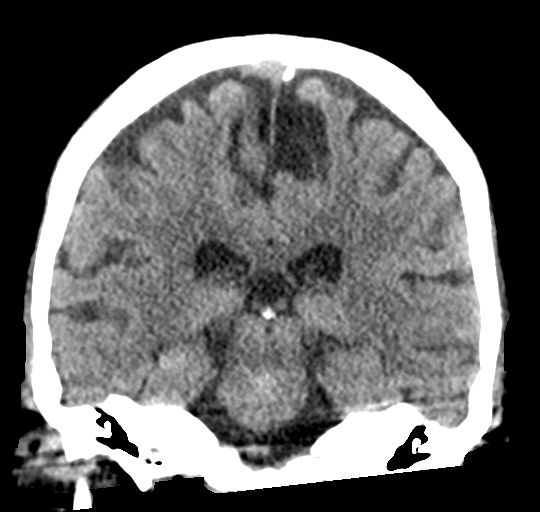
[im 36/64  brain]
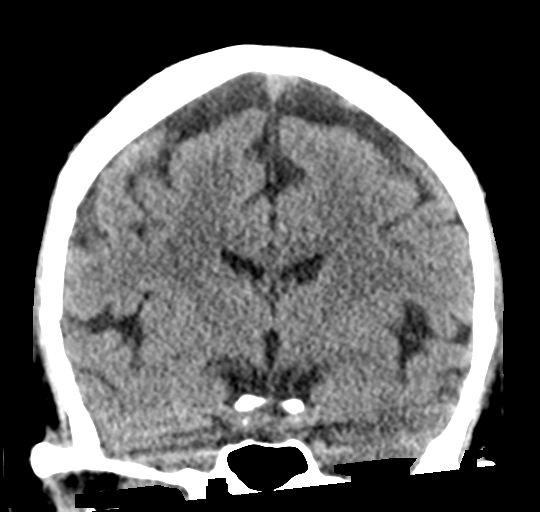

[Series 5: sagittal soft tissue · sagittal · 0.28mm/px · 3 of 52 slices shown]
[im 18/52  brain]
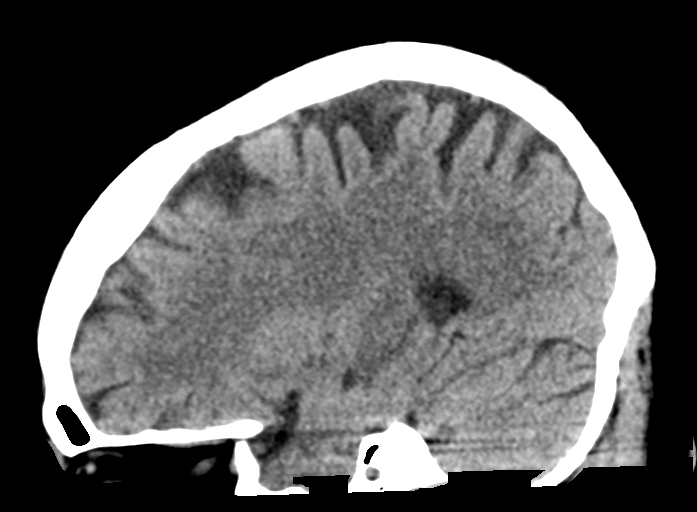
[im 26/52  brain]
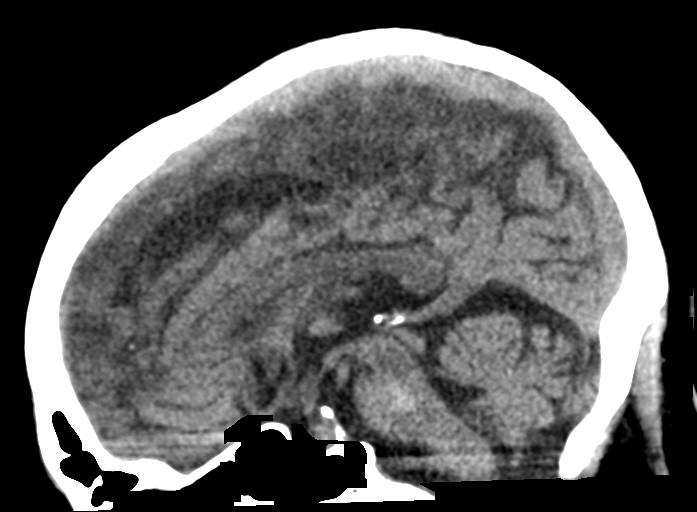
[im 34/52  brain]
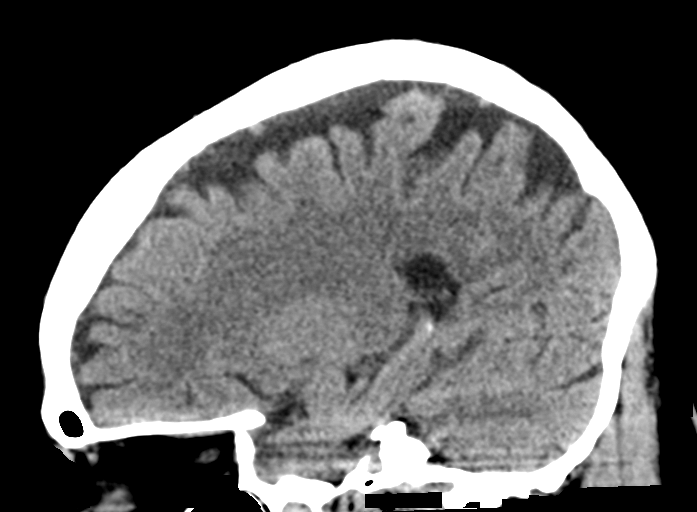

[15 of 47 positions shown; findings below may reference images not displayed]

FINDINGS: Brain: Mild atrophic changes are noted. No findings to suggest acute
hemorrhage, acute infarction or space-occupying mass lesion are
noted.

Vascular: No hyperdense vessel or unexpected calcification.

Skull: Normal. Negative for fracture or focal lesion.

Sinuses/Orbits: No acute finding.

Other: None.
IMPRESSION: Chronic atrophic changes without acute abnormality.

## 2020-11-28 DEATH — deceased
